# Patient Record
Sex: Female | Born: 1950 | Race: White | Hispanic: No | Marital: Single | Smoking: Former smoker
Health system: Southern US, Community
[De-identification: ages and names within clinical notes are randomized; demographics above are authoritative.]

## PROBLEM LIST (undated history)

## (undated) DIAGNOSIS — I700 Atherosclerosis of aorta: Secondary | ICD-10-CM

## (undated) DIAGNOSIS — T8859XA Other complications of anesthesia, initial encounter: Secondary | ICD-10-CM

## (undated) DIAGNOSIS — I110 Hypertensive heart disease with heart failure: Secondary | ICD-10-CM

## (undated) DIAGNOSIS — F329 Major depressive disorder, single episode, unspecified: Secondary | ICD-10-CM

## (undated) DIAGNOSIS — I5189 Other ill-defined heart diseases: Secondary | ICD-10-CM

## (undated) DIAGNOSIS — M797 Fibromyalgia: Secondary | ICD-10-CM

## (undated) DIAGNOSIS — T4145XA Adverse effect of unspecified anesthetic, initial encounter: Secondary | ICD-10-CM

## (undated) DIAGNOSIS — I5032 Chronic diastolic (congestive) heart failure: Secondary | ICD-10-CM

## (undated) DIAGNOSIS — R7303 Prediabetes: Secondary | ICD-10-CM

## (undated) DIAGNOSIS — F32A Depression, unspecified: Secondary | ICD-10-CM

## (undated) DIAGNOSIS — J189 Pneumonia, unspecified organism: Secondary | ICD-10-CM

## (undated) DIAGNOSIS — J45909 Unspecified asthma, uncomplicated: Secondary | ICD-10-CM

## (undated) DIAGNOSIS — K219 Gastro-esophageal reflux disease without esophagitis: Secondary | ICD-10-CM

## (undated) DIAGNOSIS — E7800 Pure hypercholesterolemia, unspecified: Secondary | ICD-10-CM

## (undated) DIAGNOSIS — N183 Chronic kidney disease, stage 3 unspecified: Secondary | ICD-10-CM

## (undated) DIAGNOSIS — I10 Essential (primary) hypertension: Secondary | ICD-10-CM

## (undated) DIAGNOSIS — F341 Dysthymic disorder: Secondary | ICD-10-CM

## (undated) DIAGNOSIS — R61 Generalized hyperhidrosis: Secondary | ICD-10-CM

## (undated) DIAGNOSIS — K589 Irritable bowel syndrome without diarrhea: Secondary | ICD-10-CM

## (undated) HISTORY — DX: Generalized hyperhidrosis: R61

## (undated) HISTORY — DX: Essential (primary) hypertension: I10

## (undated) HISTORY — DX: Chronic kidney disease, stage 3 unspecified: N18.30

## (undated) HISTORY — DX: Other ill-defined heart diseases: I51.89

## (undated) HISTORY — DX: Hypertensive heart disease with heart failure: I11.0

## (undated) HISTORY — DX: Irritable bowel syndrome, unspecified: K58.9

## (undated) HISTORY — DX: Unspecified asthma, uncomplicated: J45.909

## (undated) HISTORY — DX: Pure hypercholesterolemia, unspecified: E78.00

## (undated) HISTORY — DX: Chronic diastolic (congestive) heart failure: I50.32

## (undated) HISTORY — PX: ARTHROSCOPY KNEE W/ DRILLING: SUR92

## (undated) HISTORY — DX: Atherosclerosis of aorta: I70.0

## (undated) HISTORY — DX: Fibromyalgia: M79.7

## (undated) HISTORY — DX: Gastro-esophageal reflux disease without esophagitis: K21.9

## (undated) HISTORY — DX: Dysthymic disorder: F34.1

---
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
## 2010-05-13 ENCOUNTER — Emergency Department (HOSPITAL_COMMUNITY): Admission: EM | Admit: 2010-05-13 | Discharge: 2010-05-13 | Payer: Self-pay | Admitting: Emergency Medicine

## 2010-08-23 ENCOUNTER — Encounter: Admission: RE | Admit: 2010-08-23 | Discharge: 2010-08-23 | Payer: Self-pay | Admitting: Internal Medicine

## 2013-03-02 ENCOUNTER — Other Ambulatory Visit: Payer: Self-pay | Admitting: Family Medicine

## 2013-03-02 DIAGNOSIS — R531 Weakness: Secondary | ICD-10-CM

## 2013-03-02 DIAGNOSIS — R42 Dizziness and giddiness: Secondary | ICD-10-CM

## 2013-03-05 ENCOUNTER — Other Ambulatory Visit: Payer: Self-pay

## 2013-03-07 ENCOUNTER — Ambulatory Visit
Admission: RE | Admit: 2013-03-07 | Discharge: 2013-03-07 | Disposition: A | Discharge status: Home / Self Care | Payer: BC Managed Care – PPO | Source: Ambulatory Visit | Attending: Family Medicine | Admitting: Family Medicine

## 2013-03-07 DIAGNOSIS — R42 Dizziness and giddiness: Secondary | ICD-10-CM

## 2013-03-07 DIAGNOSIS — R531 Weakness: Secondary | ICD-10-CM

## 2013-06-09 DIAGNOSIS — M5136 Other intervertebral disc degeneration, lumbar region: Secondary | ICD-10-CM | POA: Insufficient documentation

## 2013-06-09 DIAGNOSIS — M47816 Spondylosis without myelopathy or radiculopathy, lumbar region: Secondary | ICD-10-CM | POA: Insufficient documentation

## 2013-06-09 DIAGNOSIS — M51369 Other intervertebral disc degeneration, lumbar region without mention of lumbar back pain or lower extremity pain: Secondary | ICD-10-CM

## 2013-06-09 HISTORY — DX: Spondylosis without myelopathy or radiculopathy, lumbar region: M47.816

## 2013-06-09 HISTORY — DX: Other intervertebral disc degeneration, lumbar region: M51.36

## 2013-06-09 HISTORY — DX: Other intervertebral disc degeneration, lumbar region without mention of lumbar back pain or lower extremity pain: M51.369

## 2013-06-10 ENCOUNTER — Other Ambulatory Visit: Payer: Self-pay | Admitting: Anesthesiology

## 2013-06-10 DIAGNOSIS — M542 Cervicalgia: Secondary | ICD-10-CM

## 2013-06-10 DIAGNOSIS — M545 Low back pain, unspecified: Secondary | ICD-10-CM

## 2013-06-10 DIAGNOSIS — M546 Pain in thoracic spine: Secondary | ICD-10-CM

## 2013-06-16 ENCOUNTER — Ambulatory Visit
Admission: RE | Admit: 2013-06-16 | Discharge: 2013-06-16 | Disposition: A | Discharge status: Home / Self Care | Payer: BC Managed Care – PPO | Source: Ambulatory Visit | Attending: Anesthesiology | Admitting: Anesthesiology

## 2013-06-16 DIAGNOSIS — M545 Low back pain, unspecified: Secondary | ICD-10-CM

## 2013-06-16 DIAGNOSIS — M542 Cervicalgia: Secondary | ICD-10-CM

## 2013-06-16 DIAGNOSIS — M546 Pain in thoracic spine: Secondary | ICD-10-CM

## 2013-11-10 HISTORY — PX: HERNIA REPAIR: SHX51

## 2015-08-22 DIAGNOSIS — M153 Secondary multiple arthritis: Secondary | ICD-10-CM | POA: Diagnosis not present

## 2015-08-22 DIAGNOSIS — M797 Fibromyalgia: Secondary | ICD-10-CM | POA: Diagnosis not present

## 2015-08-22 DIAGNOSIS — E782 Mixed hyperlipidemia: Secondary | ICD-10-CM | POA: Diagnosis not present

## 2015-08-22 DIAGNOSIS — R7301 Impaired fasting glucose: Secondary | ICD-10-CM | POA: Diagnosis not present

## 2015-08-22 DIAGNOSIS — M25561 Pain in right knee: Secondary | ICD-10-CM | POA: Diagnosis not present

## 2015-08-22 DIAGNOSIS — R61 Generalized hyperhidrosis: Secondary | ICD-10-CM | POA: Diagnosis not present

## 2015-08-22 DIAGNOSIS — M25562 Pain in left knee: Secondary | ICD-10-CM | POA: Diagnosis not present

## 2015-08-22 DIAGNOSIS — Z23 Encounter for immunization: Secondary | ICD-10-CM | POA: Diagnosis not present

## 2015-08-22 DIAGNOSIS — I10 Essential (primary) hypertension: Secondary | ICD-10-CM | POA: Diagnosis not present

## 2015-08-22 DIAGNOSIS — E7800 Pure hypercholesterolemia, unspecified: Secondary | ICD-10-CM | POA: Diagnosis not present

## 2015-08-22 DIAGNOSIS — Z6841 Body Mass Index (BMI) 40.0 and over, adult: Secondary | ICD-10-CM | POA: Diagnosis not present

## 2015-11-28 DIAGNOSIS — E782 Mixed hyperlipidemia: Secondary | ICD-10-CM | POA: Diagnosis not present

## 2015-11-28 DIAGNOSIS — Z6841 Body Mass Index (BMI) 40.0 and over, adult: Secondary | ICD-10-CM | POA: Diagnosis not present

## 2015-11-28 DIAGNOSIS — M153 Secondary multiple arthritis: Secondary | ICD-10-CM | POA: Diagnosis not present

## 2015-11-28 DIAGNOSIS — M797 Fibromyalgia: Secondary | ICD-10-CM | POA: Diagnosis not present

## 2015-11-28 DIAGNOSIS — R7301 Impaired fasting glucose: Secondary | ICD-10-CM | POA: Diagnosis not present

## 2015-11-28 DIAGNOSIS — M25562 Pain in left knee: Secondary | ICD-10-CM | POA: Diagnosis not present

## 2015-11-28 DIAGNOSIS — M25561 Pain in right knee: Secondary | ICD-10-CM | POA: Diagnosis not present

## 2015-11-28 DIAGNOSIS — I10 Essential (primary) hypertension: Secondary | ICD-10-CM | POA: Diagnosis not present

## 2016-02-29 DIAGNOSIS — M25561 Pain in right knee: Secondary | ICD-10-CM | POA: Diagnosis not present

## 2016-02-29 DIAGNOSIS — M25551 Pain in right hip: Secondary | ICD-10-CM | POA: Diagnosis not present

## 2016-02-29 DIAGNOSIS — M25562 Pain in left knee: Secondary | ICD-10-CM | POA: Diagnosis not present

## 2016-02-29 DIAGNOSIS — M797 Fibromyalgia: Secondary | ICD-10-CM | POA: Diagnosis not present

## 2016-02-29 DIAGNOSIS — Z6841 Body Mass Index (BMI) 40.0 and over, adult: Secondary | ICD-10-CM | POA: Diagnosis not present

## 2016-02-29 DIAGNOSIS — E782 Mixed hyperlipidemia: Secondary | ICD-10-CM | POA: Diagnosis not present

## 2016-02-29 DIAGNOSIS — R7301 Impaired fasting glucose: Secondary | ICD-10-CM | POA: Diagnosis not present

## 2016-02-29 DIAGNOSIS — M153 Secondary multiple arthritis: Secondary | ICD-10-CM | POA: Diagnosis not present

## 2016-02-29 DIAGNOSIS — R21 Rash and other nonspecific skin eruption: Secondary | ICD-10-CM | POA: Diagnosis not present

## 2016-02-29 DIAGNOSIS — I10 Essential (primary) hypertension: Secondary | ICD-10-CM | POA: Diagnosis not present

## 2016-03-31 DIAGNOSIS — R7301 Impaired fasting glucose: Secondary | ICD-10-CM | POA: Diagnosis not present

## 2016-04-09 DIAGNOSIS — R7301 Impaired fasting glucose: Secondary | ICD-10-CM | POA: Diagnosis not present

## 2016-04-15 DIAGNOSIS — R7301 Impaired fasting glucose: Secondary | ICD-10-CM | POA: Diagnosis not present

## 2016-05-22 DIAGNOSIS — H43813 Vitreous degeneration, bilateral: Secondary | ICD-10-CM | POA: Diagnosis not present

## 2016-05-22 DIAGNOSIS — H5201 Hypermetropia, right eye: Secondary | ICD-10-CM | POA: Diagnosis not present

## 2016-05-22 DIAGNOSIS — H52223 Regular astigmatism, bilateral: Secondary | ICD-10-CM | POA: Diagnosis not present

## 2016-05-22 DIAGNOSIS — H43393 Other vitreous opacities, bilateral: Secondary | ICD-10-CM | POA: Diagnosis not present

## 2016-05-22 DIAGNOSIS — H25813 Combined forms of age-related cataract, bilateral: Secondary | ICD-10-CM | POA: Diagnosis not present

## 2016-05-22 DIAGNOSIS — E119 Type 2 diabetes mellitus without complications: Secondary | ICD-10-CM | POA: Diagnosis not present

## 2016-05-22 DIAGNOSIS — Z7984 Long term (current) use of oral hypoglycemic drugs: Secondary | ICD-10-CM | POA: Diagnosis not present

## 2016-05-22 DIAGNOSIS — I10 Essential (primary) hypertension: Secondary | ICD-10-CM | POA: Diagnosis not present

## 2016-05-22 DIAGNOSIS — H524 Presbyopia: Secondary | ICD-10-CM | POA: Diagnosis not present

## 2016-06-11 DIAGNOSIS — R61 Generalized hyperhidrosis: Secondary | ICD-10-CM | POA: Diagnosis not present

## 2016-06-11 DIAGNOSIS — E785 Hyperlipidemia, unspecified: Secondary | ICD-10-CM | POA: Diagnosis not present

## 2016-06-11 DIAGNOSIS — M797 Fibromyalgia: Secondary | ICD-10-CM | POA: Diagnosis not present

## 2016-06-11 DIAGNOSIS — Z79899 Other long term (current) drug therapy: Secondary | ICD-10-CM | POA: Diagnosis not present

## 2016-06-11 DIAGNOSIS — R69 Illness, unspecified: Secondary | ICD-10-CM | POA: Diagnosis not present

## 2016-06-11 DIAGNOSIS — R7301 Impaired fasting glucose: Secondary | ICD-10-CM | POA: Diagnosis not present

## 2016-06-11 DIAGNOSIS — J302 Other seasonal allergic rhinitis: Secondary | ICD-10-CM | POA: Diagnosis not present

## 2016-06-11 DIAGNOSIS — I10 Essential (primary) hypertension: Secondary | ICD-10-CM | POA: Diagnosis not present

## 2016-06-11 DIAGNOSIS — K219 Gastro-esophageal reflux disease without esophagitis: Secondary | ICD-10-CM | POA: Diagnosis not present

## 2016-06-11 DIAGNOSIS — G2581 Restless legs syndrome: Secondary | ICD-10-CM | POA: Diagnosis not present

## 2016-07-16 DIAGNOSIS — J0100 Acute maxillary sinusitis, unspecified: Secondary | ICD-10-CM | POA: Diagnosis not present

## 2016-07-16 DIAGNOSIS — J209 Acute bronchitis, unspecified: Secondary | ICD-10-CM | POA: Diagnosis not present

## 2016-09-24 DIAGNOSIS — J3489 Other specified disorders of nose and nasal sinuses: Secondary | ICD-10-CM | POA: Diagnosis not present

## 2016-09-24 DIAGNOSIS — Z79899 Other long term (current) drug therapy: Secondary | ICD-10-CM | POA: Diagnosis not present

## 2016-09-24 DIAGNOSIS — R42 Dizziness and giddiness: Secondary | ICD-10-CM | POA: Diagnosis not present

## 2016-10-14 DIAGNOSIS — R69 Illness, unspecified: Secondary | ICD-10-CM | POA: Diagnosis not present

## 2016-10-14 DIAGNOSIS — M797 Fibromyalgia: Secondary | ICD-10-CM | POA: Diagnosis not present

## 2016-10-14 DIAGNOSIS — Z23 Encounter for immunization: Secondary | ICD-10-CM | POA: Diagnosis not present

## 2016-10-14 DIAGNOSIS — I10 Essential (primary) hypertension: Secondary | ICD-10-CM | POA: Diagnosis not present

## 2016-10-14 DIAGNOSIS — G2581 Restless legs syndrome: Secondary | ICD-10-CM | POA: Diagnosis not present

## 2016-10-14 DIAGNOSIS — E785 Hyperlipidemia, unspecified: Secondary | ICD-10-CM | POA: Diagnosis not present

## 2016-10-14 DIAGNOSIS — Z79899 Other long term (current) drug therapy: Secondary | ICD-10-CM | POA: Diagnosis not present

## 2016-10-14 DIAGNOSIS — K219 Gastro-esophageal reflux disease without esophagitis: Secondary | ICD-10-CM | POA: Diagnosis not present

## 2016-10-14 DIAGNOSIS — R7301 Impaired fasting glucose: Secondary | ICD-10-CM | POA: Diagnosis not present

## 2016-10-14 DIAGNOSIS — M159 Polyosteoarthritis, unspecified: Secondary | ICD-10-CM | POA: Diagnosis not present

## 2016-11-10 HISTORY — PX: CYST EXCISION: SHX5701

## 2016-12-04 DIAGNOSIS — M797 Fibromyalgia: Secondary | ICD-10-CM | POA: Diagnosis not present

## 2016-12-04 DIAGNOSIS — R42 Dizziness and giddiness: Secondary | ICD-10-CM | POA: Diagnosis not present

## 2016-12-04 DIAGNOSIS — R7301 Impaired fasting glucose: Secondary | ICD-10-CM | POA: Diagnosis not present

## 2016-12-04 DIAGNOSIS — E876 Hypokalemia: Secondary | ICD-10-CM | POA: Diagnosis not present

## 2016-12-04 DIAGNOSIS — M159 Polyosteoarthritis, unspecified: Secondary | ICD-10-CM | POA: Diagnosis not present

## 2016-12-04 DIAGNOSIS — K219 Gastro-esophageal reflux disease without esophagitis: Secondary | ICD-10-CM | POA: Diagnosis not present

## 2017-02-09 DIAGNOSIS — R635 Abnormal weight gain: Secondary | ICD-10-CM | POA: Diagnosis not present

## 2017-02-09 DIAGNOSIS — N951 Menopausal and female climacteric states: Secondary | ICD-10-CM | POA: Diagnosis not present

## 2017-02-11 DIAGNOSIS — E782 Mixed hyperlipidemia: Secondary | ICD-10-CM | POA: Diagnosis not present

## 2017-02-11 DIAGNOSIS — N951 Menopausal and female climacteric states: Secondary | ICD-10-CM | POA: Diagnosis not present

## 2017-02-11 DIAGNOSIS — I10 Essential (primary) hypertension: Secondary | ICD-10-CM | POA: Diagnosis not present

## 2017-02-11 DIAGNOSIS — Z6841 Body Mass Index (BMI) 40.0 and over, adult: Secondary | ICD-10-CM | POA: Diagnosis not present

## 2017-02-11 DIAGNOSIS — E538 Deficiency of other specified B group vitamins: Secondary | ICD-10-CM | POA: Diagnosis not present

## 2017-02-12 DIAGNOSIS — K219 Gastro-esophageal reflux disease without esophagitis: Secondary | ICD-10-CM | POA: Diagnosis not present

## 2017-02-12 DIAGNOSIS — J329 Chronic sinusitis, unspecified: Secondary | ICD-10-CM | POA: Diagnosis not present

## 2017-02-12 DIAGNOSIS — Z79899 Other long term (current) drug therapy: Secondary | ICD-10-CM | POA: Diagnosis not present

## 2017-02-12 DIAGNOSIS — M797 Fibromyalgia: Secondary | ICD-10-CM | POA: Diagnosis not present

## 2017-02-12 DIAGNOSIS — E785 Hyperlipidemia, unspecified: Secondary | ICD-10-CM | POA: Diagnosis not present

## 2017-02-12 DIAGNOSIS — M159 Polyosteoarthritis, unspecified: Secondary | ICD-10-CM | POA: Diagnosis not present

## 2017-02-12 DIAGNOSIS — R7301 Impaired fasting glucose: Secondary | ICD-10-CM | POA: Diagnosis not present

## 2017-02-12 DIAGNOSIS — I10 Essential (primary) hypertension: Secondary | ICD-10-CM | POA: Diagnosis not present

## 2017-02-12 DIAGNOSIS — R69 Illness, unspecified: Secondary | ICD-10-CM | POA: Diagnosis not present

## 2017-02-17 DIAGNOSIS — Z6841 Body Mass Index (BMI) 40.0 and over, adult: Secondary | ICD-10-CM | POA: Diagnosis not present

## 2017-02-17 DIAGNOSIS — E538 Deficiency of other specified B group vitamins: Secondary | ICD-10-CM | POA: Diagnosis not present

## 2017-02-17 DIAGNOSIS — I10 Essential (primary) hypertension: Secondary | ICD-10-CM | POA: Diagnosis not present

## 2017-02-17 DIAGNOSIS — E782 Mixed hyperlipidemia: Secondary | ICD-10-CM | POA: Diagnosis not present

## 2017-02-25 DIAGNOSIS — E538 Deficiency of other specified B group vitamins: Secondary | ICD-10-CM | POA: Diagnosis not present

## 2017-02-25 DIAGNOSIS — Z6841 Body Mass Index (BMI) 40.0 and over, adult: Secondary | ICD-10-CM | POA: Diagnosis not present

## 2017-02-25 DIAGNOSIS — I10 Essential (primary) hypertension: Secondary | ICD-10-CM | POA: Diagnosis not present

## 2017-02-25 DIAGNOSIS — E782 Mixed hyperlipidemia: Secondary | ICD-10-CM | POA: Diagnosis not present

## 2017-03-05 DIAGNOSIS — Z6841 Body Mass Index (BMI) 40.0 and over, adult: Secondary | ICD-10-CM | POA: Diagnosis not present

## 2017-03-05 DIAGNOSIS — I10 Essential (primary) hypertension: Secondary | ICD-10-CM | POA: Diagnosis not present

## 2017-03-05 DIAGNOSIS — E538 Deficiency of other specified B group vitamins: Secondary | ICD-10-CM | POA: Diagnosis not present

## 2017-03-05 DIAGNOSIS — E782 Mixed hyperlipidemia: Secondary | ICD-10-CM | POA: Diagnosis not present

## 2017-03-11 DIAGNOSIS — I10 Essential (primary) hypertension: Secondary | ICD-10-CM | POA: Diagnosis not present

## 2017-03-11 DIAGNOSIS — E782 Mixed hyperlipidemia: Secondary | ICD-10-CM | POA: Diagnosis not present

## 2017-03-11 DIAGNOSIS — E538 Deficiency of other specified B group vitamins: Secondary | ICD-10-CM | POA: Diagnosis not present

## 2017-03-11 DIAGNOSIS — Z6841 Body Mass Index (BMI) 40.0 and over, adult: Secondary | ICD-10-CM | POA: Diagnosis not present

## 2017-03-30 DIAGNOSIS — E782 Mixed hyperlipidemia: Secondary | ICD-10-CM | POA: Diagnosis not present

## 2017-03-30 DIAGNOSIS — I10 Essential (primary) hypertension: Secondary | ICD-10-CM | POA: Diagnosis not present

## 2017-03-30 DIAGNOSIS — Z6841 Body Mass Index (BMI) 40.0 and over, adult: Secondary | ICD-10-CM | POA: Diagnosis not present

## 2017-05-17 DIAGNOSIS — L02222 Furuncle of back [any part, except buttock]: Secondary | ICD-10-CM | POA: Diagnosis not present

## 2017-05-17 DIAGNOSIS — L0390 Cellulitis, unspecified: Secondary | ICD-10-CM | POA: Diagnosis not present

## 2017-05-20 DIAGNOSIS — L0291 Cutaneous abscess, unspecified: Secondary | ICD-10-CM | POA: Diagnosis not present

## 2017-05-29 DIAGNOSIS — L0291 Cutaneous abscess, unspecified: Secondary | ICD-10-CM | POA: Diagnosis not present

## 2017-05-29 DIAGNOSIS — Z6841 Body Mass Index (BMI) 40.0 and over, adult: Secondary | ICD-10-CM | POA: Diagnosis not present

## 2017-06-02 DIAGNOSIS — L02212 Cutaneous abscess of back [any part, except buttock]: Secondary | ICD-10-CM

## 2017-06-02 DIAGNOSIS — M797 Fibromyalgia: Secondary | ICD-10-CM | POA: Insufficient documentation

## 2017-06-02 DIAGNOSIS — M1990 Unspecified osteoarthritis, unspecified site: Secondary | ICD-10-CM | POA: Insufficient documentation

## 2017-06-02 HISTORY — DX: Unspecified osteoarthritis, unspecified site: M19.90

## 2017-06-02 HISTORY — DX: Cutaneous abscess of back (any part, except buttock): L02.212

## 2017-06-05 DIAGNOSIS — Z87891 Personal history of nicotine dependence: Secondary | ICD-10-CM | POA: Diagnosis not present

## 2017-06-05 DIAGNOSIS — I10 Essential (primary) hypertension: Secondary | ICD-10-CM | POA: Diagnosis not present

## 2017-06-05 DIAGNOSIS — L720 Epidermal cyst: Secondary | ICD-10-CM | POA: Diagnosis not present

## 2017-06-05 DIAGNOSIS — L02212 Cutaneous abscess of back [any part, except buttock]: Secondary | ICD-10-CM | POA: Diagnosis not present

## 2017-06-05 DIAGNOSIS — K219 Gastro-esophageal reflux disease without esophagitis: Secondary | ICD-10-CM | POA: Diagnosis not present

## 2017-06-05 DIAGNOSIS — M797 Fibromyalgia: Secondary | ICD-10-CM | POA: Diagnosis not present

## 2017-06-05 DIAGNOSIS — E785 Hyperlipidemia, unspecified: Secondary | ICD-10-CM | POA: Diagnosis not present

## 2017-06-05 DIAGNOSIS — Z79899 Other long term (current) drug therapy: Secondary | ICD-10-CM | POA: Diagnosis not present

## 2017-06-05 DIAGNOSIS — J449 Chronic obstructive pulmonary disease, unspecified: Secondary | ICD-10-CM | POA: Diagnosis not present

## 2017-06-09 DIAGNOSIS — R69 Illness, unspecified: Secondary | ICD-10-CM | POA: Diagnosis not present

## 2017-06-09 DIAGNOSIS — I10 Essential (primary) hypertension: Secondary | ICD-10-CM | POA: Diagnosis not present

## 2017-06-09 DIAGNOSIS — M797 Fibromyalgia: Secondary | ICD-10-CM | POA: Diagnosis not present

## 2017-06-09 DIAGNOSIS — M150 Primary generalized (osteo)arthritis: Secondary | ICD-10-CM | POA: Diagnosis not present

## 2017-06-09 DIAGNOSIS — L02212 Cutaneous abscess of back [any part, except buttock]: Secondary | ICD-10-CM | POA: Diagnosis not present

## 2017-07-14 DIAGNOSIS — Z6841 Body Mass Index (BMI) 40.0 and over, adult: Secondary | ICD-10-CM | POA: Diagnosis not present

## 2017-07-14 DIAGNOSIS — R7301 Impaired fasting glucose: Secondary | ICD-10-CM | POA: Diagnosis not present

## 2017-07-14 DIAGNOSIS — I10 Essential (primary) hypertension: Secondary | ICD-10-CM | POA: Diagnosis not present

## 2017-07-14 DIAGNOSIS — Z872 Personal history of diseases of the skin and subcutaneous tissue: Secondary | ICD-10-CM | POA: Diagnosis not present

## 2017-07-14 DIAGNOSIS — E785 Hyperlipidemia, unspecified: Secondary | ICD-10-CM | POA: Diagnosis not present

## 2017-07-14 DIAGNOSIS — Z1389 Encounter for screening for other disorder: Secondary | ICD-10-CM | POA: Diagnosis not present

## 2017-08-13 DIAGNOSIS — Z872 Personal history of diseases of the skin and subcutaneous tissue: Secondary | ICD-10-CM

## 2017-08-13 HISTORY — DX: Personal history of diseases of the skin and subcutaneous tissue: Z87.2

## 2017-09-28 DIAGNOSIS — M797 Fibromyalgia: Secondary | ICD-10-CM | POA: Diagnosis not present

## 2017-09-28 DIAGNOSIS — E785 Hyperlipidemia, unspecified: Secondary | ICD-10-CM | POA: Diagnosis not present

## 2017-09-28 DIAGNOSIS — Z1159 Encounter for screening for other viral diseases: Secondary | ICD-10-CM | POA: Diagnosis not present

## 2017-09-28 DIAGNOSIS — K219 Gastro-esophageal reflux disease without esophagitis: Secondary | ICD-10-CM | POA: Diagnosis not present

## 2017-09-28 DIAGNOSIS — Z79899 Other long term (current) drug therapy: Secondary | ICD-10-CM | POA: Diagnosis not present

## 2017-09-28 DIAGNOSIS — Z0000 Encounter for general adult medical examination without abnormal findings: Secondary | ICD-10-CM | POA: Diagnosis not present

## 2017-09-28 DIAGNOSIS — M159 Polyosteoarthritis, unspecified: Secondary | ICD-10-CM | POA: Diagnosis not present

## 2017-09-28 DIAGNOSIS — Z23 Encounter for immunization: Secondary | ICD-10-CM | POA: Diagnosis not present

## 2017-09-28 DIAGNOSIS — G2581 Restless legs syndrome: Secondary | ICD-10-CM | POA: Diagnosis not present

## 2017-09-28 DIAGNOSIS — R7301 Impaired fasting glucose: Secondary | ICD-10-CM | POA: Diagnosis not present

## 2017-09-28 DIAGNOSIS — I10 Essential (primary) hypertension: Secondary | ICD-10-CM | POA: Diagnosis not present

## 2017-09-30 DIAGNOSIS — Z1231 Encounter for screening mammogram for malignant neoplasm of breast: Secondary | ICD-10-CM | POA: Diagnosis not present

## 2017-10-07 DIAGNOSIS — K219 Gastro-esophageal reflux disease without esophagitis: Secondary | ICD-10-CM | POA: Diagnosis not present

## 2017-10-07 DIAGNOSIS — Z0000 Encounter for general adult medical examination without abnormal findings: Secondary | ICD-10-CM | POA: Diagnosis not present

## 2017-10-07 DIAGNOSIS — R69 Illness, unspecified: Secondary | ICD-10-CM | POA: Diagnosis not present

## 2017-10-07 DIAGNOSIS — K08409 Partial loss of teeth, unspecified cause, unspecified class: Secondary | ICD-10-CM | POA: Diagnosis not present

## 2017-10-07 DIAGNOSIS — R29898 Other symptoms and signs involving the musculoskeletal system: Secondary | ICD-10-CM | POA: Diagnosis not present

## 2017-10-07 DIAGNOSIS — I10 Essential (primary) hypertension: Secondary | ICD-10-CM | POA: Diagnosis not present

## 2017-10-07 DIAGNOSIS — M797 Fibromyalgia: Secondary | ICD-10-CM | POA: Diagnosis not present

## 2017-10-07 DIAGNOSIS — K029 Dental caries, unspecified: Secondary | ICD-10-CM | POA: Diagnosis not present

## 2017-11-09 DIAGNOSIS — B370 Candidal stomatitis: Secondary | ICD-10-CM | POA: Diagnosis not present

## 2017-11-09 DIAGNOSIS — R21 Rash and other nonspecific skin eruption: Secondary | ICD-10-CM | POA: Diagnosis not present

## 2017-11-09 DIAGNOSIS — Z6841 Body Mass Index (BMI) 40.0 and over, adult: Secondary | ICD-10-CM | POA: Diagnosis not present

## 2017-11-23 DIAGNOSIS — J329 Chronic sinusitis, unspecified: Secondary | ICD-10-CM | POA: Diagnosis not present

## 2017-11-23 DIAGNOSIS — Z6841 Body Mass Index (BMI) 40.0 and over, adult: Secondary | ICD-10-CM | POA: Diagnosis not present

## 2017-11-23 DIAGNOSIS — J40 Bronchitis, not specified as acute or chronic: Secondary | ICD-10-CM | POA: Diagnosis not present

## 2017-12-29 DIAGNOSIS — L710 Perioral dermatitis: Secondary | ICD-10-CM | POA: Diagnosis not present

## 2018-01-07 ENCOUNTER — Ambulatory Visit: Payer: Medicare HMO | Admitting: Allergy and Immunology

## 2018-01-07 ENCOUNTER — Encounter: Payer: Self-pay | Admitting: Allergy and Immunology

## 2018-01-07 VITALS — BP 128/78 | HR 64 | Temp 98.5°F | Resp 16 | Ht 64.0 in | Wt 275.8 lb

## 2018-01-07 DIAGNOSIS — H1010 Acute atopic conjunctivitis, unspecified eye: Secondary | ICD-10-CM

## 2018-01-07 DIAGNOSIS — R69 Illness, unspecified: Secondary | ICD-10-CM | POA: Diagnosis not present

## 2018-01-07 DIAGNOSIS — J3089 Other allergic rhinitis: Secondary | ICD-10-CM | POA: Diagnosis not present

## 2018-01-07 DIAGNOSIS — L710 Perioral dermatitis: Secondary | ICD-10-CM

## 2018-01-07 DIAGNOSIS — B999 Unspecified infectious disease: Secondary | ICD-10-CM | POA: Diagnosis not present

## 2018-01-07 DIAGNOSIS — J4520 Mild intermittent asthma, uncomplicated: Secondary | ICD-10-CM | POA: Diagnosis not present

## 2018-01-07 MED ORDER — IPRATROPIUM BROMIDE 0.02 % IN SOLN: RESPIRATORY_TRACT | 1 refills | Status: DC

## 2018-01-07 NOTE — Progress Notes (Signed)
NEW PATIENT NOTE  Referring Provider: No ref. provider found Primary Provider: Street, Sharon Mt, MD Date of office visit: 01/07/2018    Subjective:   Chief Complaint:  Sherry Riley (DOB: 26-Jan-1951) is a 67 y.o. female who presents to the clinic on 01/07/2018 with a chief complaint of Rash .  HPI: Sherry Riley presents to this clinic in evaluation of 4 issues:  First, Sherry Riley has nasal congestion and sneezing and itchy red watery eyes that appear to occur on a perennial basis and flare from spring through fall season without any obvious trigger.  Sherry Riley takes Claritin 10 mg a day throughout the entire year which does help until the spring arrives.  It does not sound as though Sherry Riley has required any antibiotics for recurrent infections of her upper airways.  Second, Sherry Riley has "bronchitis".  Whenever Sherry Riley develops a head cold Sherry Riley would develop shortness of breath and chest tightness and coughing and wheezing and Sherry Riley will start Symbicort and a short acting bronchodilator.  Fortunately, her last flareup was January 2018.  Sherry Riley does not have a regular history of wheezing or coughing.  Sherry Riley does not really exercise to any significant degree because of a musculoskeletal issue.  It does not sound as though Sherry Riley has required any systemic steroids to treat a lower airway issue in over a year.  Third, Sherry Riley has developed problems with infections on her skin.  Back in July 2018 Sherry Riley required a I&D of a very large abscess affecting her left lower back.  Starting in November 2018 Sherry Riley developed these very red inflamed areas around her mouth and down her neck.  Sherry Riley recently visited with a dermatologist who started her on antibiotics and topical metronidazole and this is helping this situation significantly.  Fourth, Sherry Riley has a history of large local reaction to insect stings without any associated systemic or constitutional symptoms that dates back to childhood.  Past Medical History:  Diagnosis Date  . Asthma   .  Fibromyalgia   . GERD (gastroesophageal reflux disease)   . High blood pressure   . High cholesterol     Past Surgical History:  Procedure Laterality Date  . CYST EXCISION  2018   Back cyst  . HERNIA REPAIR  2015    Allergies as of 01/07/2018      Reactions   Atorvastatin Other (See Comments)   Myalgia   Lyrica [pregabalin] Rash   Actonel [risedronate Sodium]    Coreg [carvedilol]    Hydralazine Hcl Other (See Comments), Cough   Chest pain, dyspnea   Ace Inhibitors Rash   Diltiazem Hcl Itching, Rash   Diovan Hct [valsartan-hydrochlorothiazide] Rash   Valsartan Rash      Medication List      albuterol (2.5 MG/3ML) 0.083% nebulizer solution Commonly known as:  PROVENTIL Take 2.5 mg by nebulization every 4 (four) hours as needed for wheezing or shortness of breath.   PROAIR HFA 108 (90 Base) MCG/ACT inhaler Generic drug:  albuterol Inhale two puffs every four to six hours as needed for breathing problems.   amitriptyline 25 MG tablet Commonly known as:  ELAVIL TAKE 1 (ONE) TABLET AT BEDTIME   CALCIUM MAGNESIUM PO Take by mouth.   chlorthalidone 25 MG tablet Commonly known as:  HYGROTON Take 25 mg by mouth daily.   doxycycline 100 MG capsule Commonly known as:  VIBRAMYCIN TAKE 1 CAPSULE BY MOUTH EVERY DAY WITH FOOD   FISH OIL PO Take by mouth daily.  gabapentin 600 MG tablet Commonly known as:  NEURONTIN Take 600 mg by mouth 4 (four) times daily.   loratadine 10 MG tablet Commonly known as:  CLARITIN Take 10 mg by mouth daily.   OSTEO BI-FLEX JOINT SHIELD PO Take by mouth.   pravastatin 40 MG tablet Commonly known as:  PRAVACHOL   ranitidine 150 MG tablet Commonly known as:  ZANTAC Take 150 mg by mouth 2 (two) times daily.   SYMBICORT 160-4.5 MCG/ACT inhaler Generic drug:  budesonide-formoterol Inhale 1 puff into the lungs 2 (two) times daily.   venlafaxine 37.5 MG tablet Commonly known as:  EFFEXOR Take 37.5 mg by mouth daily.        Review of systems negative except as noted in HPI / PMHx or noted below:  ROS  Family History  Problem Relation Age of Onset  . Asthma Mother   . Heart attack Mother   . Lung cancer Father   . Diabetes Brother     Social History   Socioeconomic History  . Marital status: Single    Spouse name: Not on file  . Number of children: Not on file  . Years of education: Not on file  . Highest education level: Not on file  Social Needs  . Financial resource strain: Not on file  . Food insecurity - worry: Not on file  . Food insecurity - inability: Not on file  . Transportation needs - medical: Not on file  . Transportation needs - non-medical: Not on file  Occupational History  . Not on file  Tobacco Use  . Smoking status: Former Smoker    Years: 36.00    Types: Cigarettes    Last attempt to quit: 2011    Years since quitting: 8.1  . Smokeless tobacco: Never Used  . Tobacco comment: one-half to one pack per day  Substance and Sexual Activity  . Alcohol use: No    Frequency: Never  . Drug use: No  . Sexual activity: Not on file  Other Topics Concern  . Not on file  Social History Narrative  . Not on file    Environmental and Social history  Lives in a house with a dry environment, no animals located inside the household, carpet in the bedroom, plastic on the bed, plastic on the pillow, and no smokers located inside the household.  Objective:   Vitals:   01/07/18 0850  BP: 128/78  Pulse: 64  Resp: 16  Temp: 98.5 F (36.9 C)   Height: 5\' 4"  (162.6 cm) Weight: 275 lb 12.8 oz (125.1 kg)  Physical Exam  Constitutional: Sherry Riley is well-developed, well-nourished, and in no distress.  HENT:  Head: Normocephalic. Head is without right periorbital erythema and without left periorbital erythema.  Right Ear: Tympanic membrane, external ear and ear canal normal.  Left Ear: Tympanic membrane, external ear and ear canal normal.  Nose: Nose normal. No mucosal edema or  rhinorrhea.  Mouth/Throat: Oropharynx is clear and moist and mucous membranes are normal. No oropharyngeal exudate.  Eyes: Conjunctivae and lids are normal. Pupils are equal, round, and reactive to light.  Neck: Trachea normal. No tracheal deviation present. No thyromegaly present.  Cardiovascular: Normal rate, regular rhythm, S1 normal, S2 normal and normal heart sounds.  No murmur heard. Pulmonary/Chest: Effort normal. No stridor. No tachypnea. No respiratory distress. Sherry Riley has no wheezes. Sherry Riley has no rales. Sherry Riley exhibits no tenderness.  Abdominal: Soft. Sherry Riley exhibits no distension and no mass. There is no hepatosplenomegaly. There is  no tenderness. There is no rebound and no guarding.  Musculoskeletal: Sherry Riley exhibits no edema or tenderness.  Lymphadenopathy:       Head (right side): No tonsillar adenopathy present.       Head (left side): No tonsillar adenopathy present.    Sherry Riley has no cervical adenopathy.    Sherry Riley has no axillary adenopathy.  Neurological: Sherry Riley is alert. Gait normal.  Skin: Rash (Approximately 12 erythematous nontender nonindurated healing ulcerative lesions affecting perioral region.) noted. Sherry Riley is not diaphoretic. No erythema. No pallor. Nails show no clubbing.  Approximately 15 cm length surgical scar left lower back  Psychiatric: Mood and affect normal.    Diagnostics: Allergy skin tests were performed.  Sherry Riley did not demonstrate any hypersensitivity against a screening panel of aeroallergens or foods.  Spirometry was performed and demonstrated an FEV1 of 2.11 @ 87 % of predicted. FEV1/FVC = 0.79.  Following the administration of nebulized albuterol her FEV1 did not improve.  Assessment and Plan:    1. Other allergic rhinitis   2. Seasonal allergic conjunctivitis   3. Asthma, mild intermittent, well-controlled   4. Perioral dermatitis   5. Recurrent infections     1.  Allergen avoidance measures?  2.  Continue topical metronidazole 0.75% cream twice a day to  face  3.  Continue Claritin 10 mg daily  4.  During "pollen season" start OTC Nasacort 1 spray each nostril 1 time per day  5.  Continue "action plan" for "bronchitis" flare up including the following:   A.  Symbicort 160 - 2 inhalations twice a day  B. Proair HFA - two inhalations every 4-6 hours if needed  C. DUONEB - nebulize every 4-6 hours if needed  6. CBC w/diff, IgA/G/M, anti-pneumo ab, anti-tetanus ab  7. Return to clinic in 12 weeks or earlier if problem  Sherry Riley appears to have significant cutaneous infections over the course of the past several years and Sherry Riley does appear to be getting a very good response to topical metronidazole regarding her perioral dermatitis.  I am going to check her blood given her history of significant infections to make sure were not dealing with a B-cell immune system abnormality.  I have given her some information about medical therapy to utilize during upper airway seasonal flares and I have given her an action plan to initiate the next time Sherry Riley develops what appears to be a component of situational asthma.  We will see how things go over the course of the next 12 weeks.  Allena Katz, MD Allergy / Immunology [place]

## 2018-01-07 NOTE — Patient Instructions (Addendum)
  1.  Allergen avoidance measures?  2.  Continue topical metronidazole 0.75% cream twice a day to face  3.  Continue Claritin 10 mg daily  4.  During "pollen season" start OTC Nasacort 1 spray each nostril 1 time per day  5.  Continue "action plan" for "bronchitis" flare up including the following:   A.  Symbicort 160 - 2 inhalations twice a day  B. Proair HFA - two inhalations every 4-6 hours if needed  C. DUONEB - nebulize every 4-6 hours if needed  6. CBC w/diff, IgA/G/M, anti-pneumo ab, anti-tetanus ab  7. Return to clinic in 12 weeks or earlier if problem

## 2018-01-11 ENCOUNTER — Encounter: Payer: Self-pay | Admitting: Allergy and Immunology

## 2018-01-14 LAB — STREP PNEUMONIAE 23 SEROTYPES IGG
PNEUMO AB TYPE 12 (12F): 1.1 ug/mL — AB (ref >1.3)
PNEUMO AB TYPE 14: 1.3 ug/mL — AB (ref >1.3)
PNEUMO AB TYPE 17 (17F): 5.6 ug/mL (ref >1.3)
PNEUMO AB TYPE 1: 0.8 ug/mL — AB (ref >1.3)
PNEUMO AB TYPE 20: 0.2 ug/mL — AB (ref >1.3)
PNEUMO AB TYPE 2: 1.9 ug/mL (ref >1.3)
PNEUMO AB TYPE 3: 0.5 ug/mL — AB (ref >1.3)
PNEUMO AB TYPE 51 (7F): 7.9 ug/mL (ref >1.3)
PNEUMO AB TYPE 54 (15B): 2.2 ug/mL (ref >1.3)
PNEUMO AB TYPE 56 (18C): 3.2 ug/mL (ref >1.3)
PNEUMO AB TYPE 57 (19A): 0.6 ug/mL — AB (ref >1.3)
Pneumo Ab Type 19 (19F)*: 0.9 ug/mL — ABNORMAL LOW (ref >1.3)
Pneumo Ab Type 22 (22F)*: <0.1 ug/mL — ABNORMAL LOW (ref >1.3)
Pneumo Ab Type 23 (23F)*: 1.3 ug/mL — ABNORMAL LOW (ref >1.3)
Pneumo Ab Type 34 (10A)*: >30.8 ug/mL (ref >1.3)
Pneumo Ab Type 4*: 2 ug/mL (ref >1.3)
Pneumo Ab Type 43 (11A)*: 1 ug/mL — ABNORMAL LOW (ref >1.3)
Pneumo Ab Type 5*: 7.1 ug/mL (ref >1.3)
Pneumo Ab Type 68 (9V)*: 0.8 ug/mL — ABNORMAL LOW (ref >1.3)
Pneumo Ab Type 70 (33F)*: >8.3 ug/mL (ref >1.3)
Pneumo Ab Type 8*: 2.9 ug/mL (ref >1.3)
Pneumo Ab Type 9 (9N)*: <0.1 ug/mL — ABNORMAL LOW (ref >1.3)

## 2018-01-14 LAB — CBC WITH DIFFERENTIAL/PLATELET
Basophils Absolute: 0.1 10*3/uL (ref 0.0–0.2)
Basos: 1 %
EOS (ABSOLUTE): 0.1 10*3/uL (ref 0.0–0.4)
EOS: 1 %
HEMATOCRIT: 42.2 % (ref 34.0–46.6)
HEMOGLOBIN: 14.5 g/dL (ref 11.1–15.9)
IMMATURE GRANS (ABS): 0 10*3/uL (ref 0.0–0.1)
Immature Granulocytes: 0 %
LYMPHS ABS: 3.9 10*3/uL — AB (ref 0.7–3.1)
LYMPHS: 36 %
MCH: 30.4 pg (ref 26.6–33.0)
MCHC: 34.4 g/dL (ref 31.5–35.7)
MCV: 89 fL (ref 79–97)
MONOCYTES: 10 %
Monocytes Absolute: 1.1 10*3/uL — ABNORMAL HIGH (ref 0.1–0.9)
Neutrophils Absolute: 5.7 10*3/uL (ref 1.4–7.0)
Neutrophils: 52 %
Platelets: 267 10*3/uL (ref 150–379)
RBC: 4.77 x10E6/uL (ref 3.77–5.28)
RDW: 14.2 % (ref 12.3–15.4)
WBC: 10.8 10*3/uL (ref 3.4–10.8)

## 2018-01-14 LAB — TETANUS ANTIBODY, IGG: TETANUS AB, IGG: 3.17 [IU]/mL (ref <0.10)

## 2018-01-14 LAB — IGG, IGA, IGM
IGM (IMMUNOGLOBULIN M), SRM: 128 mg/dL (ref 26–217)
IgA/Immunoglobulin A, Serum: 152 mg/dL (ref 87–352)
IgG (Immunoglobin G), Serum: 958 mg/dL (ref 700–1600)

## 2018-02-09 DIAGNOSIS — L710 Perioral dermatitis: Secondary | ICD-10-CM | POA: Diagnosis not present

## 2018-02-09 DIAGNOSIS — L820 Inflamed seborrheic keratosis: Secondary | ICD-10-CM | POA: Diagnosis not present

## 2018-03-24 DIAGNOSIS — J309 Allergic rhinitis, unspecified: Secondary | ICD-10-CM | POA: Diagnosis not present

## 2018-03-24 DIAGNOSIS — I10 Essential (primary) hypertension: Secondary | ICD-10-CM | POA: Diagnosis not present

## 2018-03-24 DIAGNOSIS — Z6841 Body Mass Index (BMI) 40.0 and over, adult: Secondary | ICD-10-CM | POA: Diagnosis not present

## 2018-03-24 DIAGNOSIS — E785 Hyperlipidemia, unspecified: Secondary | ICD-10-CM | POA: Diagnosis not present

## 2018-03-24 DIAGNOSIS — K08409 Partial loss of teeth, unspecified cause, unspecified class: Secondary | ICD-10-CM | POA: Diagnosis not present

## 2018-03-24 DIAGNOSIS — K219 Gastro-esophageal reflux disease without esophagitis: Secondary | ICD-10-CM | POA: Diagnosis not present

## 2018-03-24 DIAGNOSIS — R69 Illness, unspecified: Secondary | ICD-10-CM | POA: Diagnosis not present

## 2018-03-24 DIAGNOSIS — F419 Anxiety disorder, unspecified: Secondary | ICD-10-CM | POA: Diagnosis not present

## 2018-03-24 DIAGNOSIS — G8929 Other chronic pain: Secondary | ICD-10-CM | POA: Diagnosis not present

## 2018-04-07 ENCOUNTER — Encounter: Payer: Self-pay | Admitting: Allergy and Immunology

## 2018-04-07 ENCOUNTER — Ambulatory Visit: Payer: Medicare HMO | Admitting: Allergy and Immunology

## 2018-04-07 VITALS — BP 136/86 | HR 88 | Resp 22

## 2018-04-07 DIAGNOSIS — L710 Perioral dermatitis: Secondary | ICD-10-CM | POA: Diagnosis not present

## 2018-04-07 DIAGNOSIS — D72828 Other elevated white blood cell count: Secondary | ICD-10-CM

## 2018-04-07 DIAGNOSIS — B999 Unspecified infectious disease: Secondary | ICD-10-CM

## 2018-04-07 DIAGNOSIS — J4520 Mild intermittent asthma, uncomplicated: Secondary | ICD-10-CM | POA: Diagnosis not present

## 2018-04-07 DIAGNOSIS — J3089 Other allergic rhinitis: Secondary | ICD-10-CM

## 2018-04-07 NOTE — Patient Instructions (Addendum)
  1.  Continue topical metronidazole 0.75% cream twice a day to face  2.  Continue Claritin 10 mg daily  3.  Continue OTC Nasacort 1 spray each nostril 1 time per day  4.  Continue "action plan" for "bronchitis" flare up including the following:   A.  Symbicort 160 - 2 inhalations twice a day  B. Proair HFA - two inhalations every 4-6 hours if needed  C. DUONEB - nebulize every 4-6 hours if needed  5. Obtain Pneumovax. 4 weeks later check Pneumo-23 and CBC w/diff    6. Return to clinic in 6 months or earlier if problem

## 2018-04-07 NOTE — Progress Notes (Signed)
Follow-up Note  Referring Provider: Street, Sharon Mt, * Primary Provider: Street, Sharon Mt, MD Date of Office Visit: 04/07/2018  Subjective:   Sherry Riley (DOB: 1951/02/03) is a 67 y.o. female who returns to the [facility] on 04/07/2018 in re-evaluation of the following:  HPI: Keiasha returns to this clinic in reevaluation of her upper airway and lower airway and skin issues addressed during her initial evaluation of 07 January 2018.  She has had no issues with her respiratory tract since that visit.  She has not had a cold or any type of upper airway symptoms or recurrent bronchitis and has not had to activate her action plan for an asthma flare and has not had to use a short acting bronchodilator.  She continues to use Nasacort on a consistent basis as well as an antihistamine.  She has had no infections of her skin.  She has been using metronidazole on her face on a consistent basis.  Allergies as of 04/07/2018      Reactions   Atorvastatin Other (See Comments)   Myalgia   Lyrica [pregabalin] Rash   Actonel [risedronate Sodium]    Coreg [carvedilol]    Hydralazine Hcl Other (See Comments), Cough   Chest pain, dyspnea   Ace Inhibitors Rash   Diltiazem Hcl Itching, Rash   Diovan Hct [valsartan-hydrochlorothiazide] Rash   Valsartan Rash      Medication List      albuterol (2.5 MG/3ML) 0.083% nebulizer solution Commonly known as:  PROVENTIL Take 2.5 mg by nebulization every 4 (four) hours as needed for wheezing or shortness of breath.   PROAIR HFA 108 (90 Base) MCG/ACT inhaler Generic drug:  albuterol Inhale two puffs every four to six hours as needed for breathing problems.   amitriptyline 25 MG tablet Commonly known as:  ELAVIL TAKE 1 (ONE) TABLET AT BEDTIME   CALCIUM MAGNESIUM PO Take by mouth.   chlorthalidone 25 MG tablet Commonly known as:  HYGROTON Take 25 mg by mouth daily.   doxycycline 100 MG capsule Commonly known as:   VIBRAMYCIN TAKE 1 CAPSULE BY MOUTH EVERY DAY WITH FOOD   FISH OIL PO Take by mouth daily.   gabapentin 600 MG tablet Commonly known as:  NEURONTIN Take 600 mg by mouth 4 (four) times daily.   ipratropium 0.02 % nebulizer solution Commonly known as:  ATROVENT Can use one vial in nebulizer every four to six hours as needed for cough or wheeze.  Mix with Albuterol.   loratadine 10 MG tablet Commonly known as:  CLARITIN Take 10 mg by mouth daily.   LORazepam 0.5 MG tablet Commonly known as:  ATIVAN TAKE 1 TO 2 TABS EVERY DAY AS NEEDED   meclizine 12.5 MG tablet Commonly known as:  ANTIVERT TAKE 1 OR 2 TABLETS BY MOUTH EVERY 8 HOURS AS NEEDED FOR DIZZINESS   metroNIDAZOLE 0.75 % cream Commonly known as:  METROCREAM APPLY TO FACE TWICE A DAY   OSTEO BI-FLEX JOINT SHIELD PO Take by mouth.   pravastatin 40 MG tablet Commonly known as:  PRAVACHOL   ranitidine 150 MG tablet Commonly known as:  ZANTAC Take 150 mg by mouth 2 (two) times daily.   SYMBICORT 160-4.5 MCG/ACT inhaler Generic drug:  budesonide-formoterol Inhale 1 puff into the lungs 2 (two) times daily.   venlafaxine 37.5 MG tablet Commonly known as:  EFFEXOR Take 37.5 mg by mouth daily.       Past Medical History:  Diagnosis Date  .  Asthma   . Fibromyalgia   . GERD (gastroesophageal reflux disease)   . High blood pressure   . High cholesterol     Past Surgical History:  Procedure Laterality Date  . CYST EXCISION  2018   Back cyst  . HERNIA REPAIR  2015    Review of systems negative except as noted in HPI / PMHx or noted below:  Review of Systems  Constitutional: Negative.   HENT: Negative.   Eyes: Negative.   Respiratory: Negative.   Cardiovascular: Negative.   Gastrointestinal: Negative.   Genitourinary: Negative.   Musculoskeletal: Negative.   Skin: Negative.   Neurological: Negative.   Endo/Heme/Allergies: Negative.   Psychiatric/Behavioral: Negative.      Objective:   Vitals:     04/07/18 1452  BP: 136/86  Pulse: 88  Resp: (!) 22          Physical Exam  HENT:  Head: Normocephalic.  Right Ear: Tympanic membrane, external ear and ear canal normal.  Left Ear: Tympanic membrane, external ear and ear canal normal.  Nose: Nose normal. No mucosal edema or rhinorrhea.  Mouth/Throat: Uvula is midline, oropharynx is clear and moist and mucous membranes are normal. No oropharyngeal exudate.  Eyes: Conjunctivae are normal.  Neck: Trachea normal. No tracheal tenderness present. No tracheal deviation present. No thyromegaly present.  Cardiovascular: Normal rate, regular rhythm, S1 normal, S2 normal and normal heart sounds.  No murmur heard. Pulmonary/Chest: Breath sounds normal. No stridor. No respiratory distress. She has no wheezes. She has no rales.  Musculoskeletal: She exhibits no edema.  Lymphadenopathy:       Head (right side): No tonsillar adenopathy present.       Head (left side): No tonsillar adenopathy present.    She has no cervical adenopathy.  Neurological: She is alert.  Skin: No rash noted. She is not diaphoretic. No erythema. Nails show no clubbing.    Diagnostics:    Spirometry was performed and demonstrated an FEV1 of 1.91 at 79 % of predicted.  The patient had an Asthma Control Test with the following results: ACT Total Score: 25.    Results of blood tests obtained 07 January 2018 identified WBC 10.8, absolute eosinophil 100, absolute lymphocyte 3900, hemoglobin 14.5, platelet 267, IgG 958 mg/DL, IgM 128 mg/DL, IgA 152 mg/DL, and 13 out of 23 serotypes of pneumococcus with inadequate antibody titers.  Assessment and Plan:   1. Asthma, mild intermittent, well-controlled   2. Other allergic rhinitis   3. Perioral dermatitis   4. Recurrent infections   5. Other elevated white blood cell (WBC) count     1.  Continue topical metronidazole 0.75% cream twice a day to face  2.  Continue Claritin 10 mg daily  3.  Continue OTC Nasacort 1  spray each nostril 1 time per day  4.  Continue "action plan" for "bronchitis" flare up including the following:   A.  Symbicort 160 - 2 inhalations twice a day  B. Proair HFA - two inhalations every 4-6 hours if needed  C. DUONEB - nebulize every 4-6 hours if needed  5. Obtain Pneumovax. 4 weeks later check Pneumo-23 and CBC w/diff    6. Return to clinic in 6 months or earlier if problem  Mishelle is better and I have encouraged her to consistently use topical metronidazole and a nasal steroid and if required activate her action plan for lower airway inflammation.  She has a very inadequate response to pneumococcus and we will immunize her with  a Pneumovax and make sure that she can respond to this antigen.  As well, we will repeat her CBC with differential to see if her lymphocytosis has resolved and if it has progressed then we will need to up obtain a immunophenotyping study.  Allena Katz, MD Allergy / Immunology [place]

## 2018-04-08 ENCOUNTER — Encounter: Payer: Self-pay | Admitting: Allergy and Immunology

## 2018-04-22 DIAGNOSIS — R69 Illness, unspecified: Secondary | ICD-10-CM | POA: Diagnosis not present

## 2018-04-22 DIAGNOSIS — R7301 Impaired fasting glucose: Secondary | ICD-10-CM | POA: Diagnosis not present

## 2018-04-22 DIAGNOSIS — K219 Gastro-esophageal reflux disease without esophagitis: Secondary | ICD-10-CM | POA: Diagnosis not present

## 2018-04-22 DIAGNOSIS — M797 Fibromyalgia: Secondary | ICD-10-CM | POA: Diagnosis not present

## 2018-04-22 DIAGNOSIS — E785 Hyperlipidemia, unspecified: Secondary | ICD-10-CM | POA: Diagnosis not present

## 2018-04-22 DIAGNOSIS — G2581 Restless legs syndrome: Secondary | ICD-10-CM | POA: Diagnosis not present

## 2018-04-22 DIAGNOSIS — Z23 Encounter for immunization: Secondary | ICD-10-CM | POA: Diagnosis not present

## 2018-04-22 DIAGNOSIS — M159 Polyosteoarthritis, unspecified: Secondary | ICD-10-CM | POA: Diagnosis not present

## 2018-04-22 DIAGNOSIS — J302 Other seasonal allergic rhinitis: Secondary | ICD-10-CM | POA: Diagnosis not present

## 2018-04-22 DIAGNOSIS — I10 Essential (primary) hypertension: Secondary | ICD-10-CM | POA: Diagnosis not present

## 2018-04-28 DIAGNOSIS — E785 Hyperlipidemia, unspecified: Secondary | ICD-10-CM | POA: Diagnosis not present

## 2018-04-28 DIAGNOSIS — E348 Other specified endocrine disorders: Secondary | ICD-10-CM | POA: Diagnosis not present

## 2018-04-28 DIAGNOSIS — R7301 Impaired fasting glucose: Secondary | ICD-10-CM | POA: Diagnosis not present

## 2018-05-11 DIAGNOSIS — L820 Inflamed seborrheic keratosis: Secondary | ICD-10-CM | POA: Diagnosis not present

## 2018-05-11 DIAGNOSIS — L710 Perioral dermatitis: Secondary | ICD-10-CM | POA: Diagnosis not present

## 2018-08-09 DIAGNOSIS — J0100 Acute maxillary sinusitis, unspecified: Secondary | ICD-10-CM | POA: Diagnosis not present

## 2018-09-27 ENCOUNTER — Ambulatory Visit: Payer: Medicare HMO | Admitting: Allergy and Immunology

## 2018-09-27 ENCOUNTER — Encounter: Payer: Self-pay | Admitting: Allergy and Immunology

## 2018-09-27 VITALS — BP 142/92 | HR 76 | Resp 20

## 2018-09-27 DIAGNOSIS — J3089 Other allergic rhinitis: Secondary | ICD-10-CM

## 2018-09-27 DIAGNOSIS — L710 Perioral dermatitis: Secondary | ICD-10-CM

## 2018-09-27 DIAGNOSIS — D72828 Other elevated white blood cell count: Secondary | ICD-10-CM

## 2018-09-27 DIAGNOSIS — J4520 Mild intermittent asthma, uncomplicated: Secondary | ICD-10-CM

## 2018-09-27 DIAGNOSIS — B999 Unspecified infectious disease: Secondary | ICD-10-CM | POA: Diagnosis not present

## 2018-09-27 NOTE — Progress Notes (Signed)
Follow-up Note  Referring Provider: Street, Sharon Mt, * Primary Provider: Street, Sharon Mt, MD Date of Office Visit: 09/27/2018  Subjective:   Sherry Riley (DOB: Aug 11, 1951) is a 67 y.o. female who returns to the [facility] on 09/27/2018 in re-evaluation of the following:  HPI: Sherry Riley returns to this clinic in reevaluation of asthma and allergic rhinitis and facial dermatitis.  Her last visit to this clinic was 07 Apr 2018.  She has really had an excellent interval of time.  She states that she only had one event involving her respiratory tract with lots of sneezing and nasal congestion for which she was given an antibiotic which she did not use.  Otherwise, her nose has really been doing quite well with very rare use of Nasacort.  She has not had any episodes of "bronchitis" and has not had to use a short acting bronchodilator nor required a systemic steroid for any type of respiratory tract issue.  She has not been having any dermatitis on her face while consistently using topical metronidazole.  She did obtain the Pneumovax and most recently the flu vaccine.  She never did have follow-up blood tests as far she can remember in investigation of her response to the Pneumovax and her investigation of lymphocytosis.  She did have some form of blood test performed in September 2019 at [facility].  Allergies as of 09/27/2018      Reactions   Atorvastatin Other (See Comments)   Myalgia   Lyrica [pregabalin] Rash   Actonel [risedronate Sodium]    Coreg [carvedilol]    Hydralazine Hcl Other (See Comments), Cough   Chest pain, dyspnea   Ace Inhibitors Rash   Diltiazem Hcl Itching, Rash   Diovan Hct [valsartan-hydrochlorothiazide] Rash   Valsartan Rash      Medication List      albuterol (2.5 MG/3ML) 0.083% nebulizer solution Commonly known as:  PROVENTIL Take 2.5 mg by nebulization every 4 (four) hours as needed for wheezing or  shortness of breath.   PROAIR HFA 108 (90 Base) MCG/ACT inhaler Generic drug:  albuterol Inhale two puffs every four to six hours as needed for breathing problems.   amitriptyline 25 MG tablet Commonly known as:  ELAVIL TAKE 1 (ONE) TABLET AT BEDTIME   CALCIUM MAGNESIUM PO Take by mouth.   chlorthalidone 25 MG tablet Commonly known as:  HYGROTON Take 25 mg by mouth daily.   doxycycline 100 MG capsule Commonly known as:  VIBRAMYCIN TAKE 1 CAPSULE BY MOUTH EVERY DAY WITH FOOD   FISH OIL PO Take by mouth daily.   gabapentin 600 MG tablet Commonly known as:  NEURONTIN Take 600 mg by mouth 4 (four) times daily.   ipratropium 0.02 % nebulizer solution Commonly known as:  ATROVENT Can use one vial in nebulizer every four to six hours as needed for cough or wheeze.  Mix with Albuterol.   loratadine 10 MG tablet Commonly known as:  CLARITIN Take 10 mg by mouth daily.   LORazepam 0.5 MG tablet Commonly known as:  ATIVAN TAKE 1 TO 2 TABS EVERY DAY AS NEEDED   meclizine 12.5 MG tablet Commonly known as:  ANTIVERT TAKE 1 OR 2 TABLETS BY MOUTH EVERY 8 HOURS AS NEEDED FOR DIZZINESS   metroNIDAZOLE 0.75 % cream Commonly known as:  METROCREAM APPLY TO FACE TWICE A DAY   OSTEO BI-FLEX JOINT SHIELD PO Take by mouth.   pravastatin 40 MG tablet Commonly known as:  PRAVACHOL   ranitidine 150 MG tablet Commonly known as:  ZANTAC Take 150 mg by mouth 2 (two) times daily.   SYMBICORT 160-4.5 MCG/ACT inhaler Generic drug:  budesonide-formoterol Inhale 1 puff into the lungs 2 (two) times daily.   venlafaxine 37.5 MG tablet Commonly known as:  EFFEXOR Take 37.5 mg by mouth daily.       Past Medical History:  Diagnosis Date  . Asthma   . Fibromyalgia   . GERD (gastroesophageal reflux disease)   . High blood pressure   . High cholesterol     Past Surgical History:  Procedure Laterality Date  . CYST EXCISION  2018   Back cyst  . HERNIA REPAIR  2015    Review of  systems negative except as noted in HPI / PMHx or noted below:  Review of Systems  Constitutional: Negative.   HENT: Negative.   Eyes: Negative.   Respiratory: Negative.   Cardiovascular: Negative.   Gastrointestinal: Negative.   Genitourinary: Negative.   Musculoskeletal: Negative.   Skin: Negative.   Neurological: Negative.   Endo/Heme/Allergies: Negative.   Psychiatric/Behavioral: Negative.      Objective:   Vitals:   09/27/18 1459  BP: (!) 142/92  Pulse: 76  Resp: 20          Physical Exam  HENT:  Head: Normocephalic.  Right Ear: Tympanic membrane, external ear and ear canal normal.  Left Ear: Tympanic membrane, external ear and ear canal normal.  Nose: Nose normal. No mucosal edema or rhinorrhea.  Mouth/Throat: Uvula is midline, oropharynx is clear and moist and mucous membranes are normal. No oropharyngeal exudate.  Eyes: Conjunctivae are normal.  Neck: Trachea normal. No tracheal tenderness present. No tracheal deviation present. No thyromegaly present.  Cardiovascular: Normal rate, regular rhythm, S1 normal, S2 normal and normal heart sounds.  No murmur heard. Pulmonary/Chest: Breath sounds normal. No stridor. No respiratory distress. She has no wheezes. She has no rales.  Musculoskeletal: She exhibits no edema.  Lymphadenopathy:       Head (right side): No tonsillar adenopathy present.       Head (left side): No tonsillar adenopathy present.    She has no cervical adenopathy.  Neurological: She is alert.  Skin: No rash noted. She is not diaphoretic. No erythema. Nails show no clubbing.    Diagnostics:    Spirometry was performed and demonstrated an FEV1 of 1.68 at 70 % of predicted.  The patient had an Asthma Control Test with the following results: ACT Total Score: 25.    Assessment and Plan:   1. Asthma, mild intermittent, well-controlled   2. Other allergic rhinitis   3. Perioral dermatitis   4. Recurrent infections   5. Other elevated white  blood cell (WBC) count     1.  Continue topical metronidazole 0.75% cream twice a day to face  2.  Continue Claritin 10 mg if needed  3.  Continue OTC Nasacort 1 spray each nostril 1 time per day during periods of upper airway symptoms  4.  Continue "action plan" for "bronchitis" flare up including the following:   A.  Symbicort 160 - 2 inhalations twice a day  B. Proair HFA - two inhalations every 4-6 hours if needed  C. DUONEB - nebulize every 4-6 hours if needed  5.  Review results from recent blood tests.  Further testing?  6. Return to clinic in 6 months or earlier if problem  Overall Orly has really done quite well with her respiratory  track and her skin issues.  I would like for her to continue to utilize therapy as noted above and assuming she does well I will see her back in this clinic in 6 months.  I am going to review her recent blood tests.  Given the fact that she has done so well for such a relatively long interval time there may not be the need for any additional blood tests as long as there is a CBC identifying a stable lymphocyte count.  Allena Katz, MD Allergy / Immunology [place]

## 2018-09-27 NOTE — Patient Instructions (Addendum)
  1.  Continue topical metronidazole 0.75% cream twice a day to face  2.  Continue Claritin 10 mg if needed  3.  Continue OTC Nasacort 1 spray each nostril 1 time per day during periods of upper airway symptoms  4.  Continue "action plan" for "bronchitis" flare up including the following:   A.  Symbicort 160 - 2 inhalations twice a day  B. Proair HFA - two inhalations every 4-6 hours if needed  C. DUONEB - nebulize every 4-6 hours if needed  5.  Review results from recent blood tests.  Further testing?  6. Return to clinic in 6 months or earlier if problem

## 2018-09-28 ENCOUNTER — Encounter: Payer: Self-pay | Admitting: Allergy and Immunology

## 2018-10-05 DIAGNOSIS — L710 Perioral dermatitis: Secondary | ICD-10-CM | POA: Diagnosis not present

## 2018-10-05 DIAGNOSIS — L719 Rosacea, unspecified: Secondary | ICD-10-CM | POA: Diagnosis not present

## 2018-12-07 DIAGNOSIS — I10 Essential (primary) hypertension: Secondary | ICD-10-CM | POA: Diagnosis not present

## 2018-12-07 DIAGNOSIS — R7301 Impaired fasting glucose: Secondary | ICD-10-CM | POA: Diagnosis not present

## 2018-12-07 DIAGNOSIS — K580 Irritable bowel syndrome with diarrhea: Secondary | ICD-10-CM | POA: Diagnosis not present

## 2018-12-07 DIAGNOSIS — K219 Gastro-esophageal reflux disease without esophagitis: Secondary | ICD-10-CM | POA: Diagnosis not present

## 2018-12-07 DIAGNOSIS — M159 Polyosteoarthritis, unspecified: Secondary | ICD-10-CM | POA: Diagnosis not present

## 2018-12-07 DIAGNOSIS — G2581 Restless legs syndrome: Secondary | ICD-10-CM | POA: Diagnosis not present

## 2018-12-07 DIAGNOSIS — M797 Fibromyalgia: Secondary | ICD-10-CM | POA: Diagnosis not present

## 2018-12-07 DIAGNOSIS — Z0000 Encounter for general adult medical examination without abnormal findings: Secondary | ICD-10-CM | POA: Diagnosis not present

## 2018-12-07 DIAGNOSIS — R69 Illness, unspecified: Secondary | ICD-10-CM | POA: Diagnosis not present

## 2018-12-07 DIAGNOSIS — E785 Hyperlipidemia, unspecified: Secondary | ICD-10-CM | POA: Diagnosis not present

## 2018-12-08 DIAGNOSIS — E785 Hyperlipidemia, unspecified: Secondary | ICD-10-CM | POA: Diagnosis not present

## 2018-12-08 DIAGNOSIS — R739 Hyperglycemia, unspecified: Secondary | ICD-10-CM | POA: Diagnosis not present

## 2018-12-08 DIAGNOSIS — Z79899 Other long term (current) drug therapy: Secondary | ICD-10-CM | POA: Diagnosis not present

## 2018-12-08 DIAGNOSIS — G2581 Restless legs syndrome: Secondary | ICD-10-CM | POA: Diagnosis not present

## 2018-12-21 DIAGNOSIS — Z1231 Encounter for screening mammogram for malignant neoplasm of breast: Secondary | ICD-10-CM | POA: Diagnosis not present

## 2019-03-28 ENCOUNTER — Ambulatory Visit: Payer: Medicare HMO | Admitting: Allergy and Immunology

## 2019-04-14 ENCOUNTER — Ambulatory Visit: Payer: Medicare HMO | Admitting: Allergy and Immunology

## 2019-09-12 DIAGNOSIS — Z23 Encounter for immunization: Secondary | ICD-10-CM | POA: Diagnosis not present

## 2019-09-12 DIAGNOSIS — Z79899 Other long term (current) drug therapy: Secondary | ICD-10-CM | POA: Diagnosis not present

## 2019-09-12 DIAGNOSIS — M159 Polyosteoarthritis, unspecified: Secondary | ICD-10-CM | POA: Diagnosis not present

## 2019-09-12 DIAGNOSIS — K219 Gastro-esophageal reflux disease without esophagitis: Secondary | ICD-10-CM | POA: Diagnosis not present

## 2019-09-12 DIAGNOSIS — G2581 Restless legs syndrome: Secondary | ICD-10-CM | POA: Diagnosis not present

## 2019-09-12 DIAGNOSIS — K580 Irritable bowel syndrome with diarrhea: Secondary | ICD-10-CM | POA: Diagnosis not present

## 2019-09-12 DIAGNOSIS — R69 Illness, unspecified: Secondary | ICD-10-CM | POA: Diagnosis not present

## 2019-09-12 DIAGNOSIS — R7301 Impaired fasting glucose: Secondary | ICD-10-CM | POA: Diagnosis not present

## 2019-09-12 DIAGNOSIS — I10 Essential (primary) hypertension: Secondary | ICD-10-CM | POA: Diagnosis not present

## 2019-09-12 DIAGNOSIS — E785 Hyperlipidemia, unspecified: Secondary | ICD-10-CM | POA: Diagnosis not present

## 2019-11-22 DIAGNOSIS — Z6841 Body Mass Index (BMI) 40.0 and over, adult: Secondary | ICD-10-CM | POA: Diagnosis not present

## 2019-11-22 DIAGNOSIS — I10 Essential (primary) hypertension: Secondary | ICD-10-CM | POA: Diagnosis not present

## 2019-11-22 DIAGNOSIS — S46912A Strain of unspecified muscle, fascia and tendon at shoulder and upper arm level, left arm, initial encounter: Secondary | ICD-10-CM | POA: Diagnosis not present

## 2019-11-22 DIAGNOSIS — M7552 Bursitis of left shoulder: Secondary | ICD-10-CM | POA: Diagnosis not present

## 2019-11-22 DIAGNOSIS — R7301 Impaired fasting glucose: Secondary | ICD-10-CM | POA: Diagnosis not present

## 2019-12-16 ENCOUNTER — Other Ambulatory Visit: Payer: Self-pay | Admitting: Orthopedic Surgery

## 2019-12-16 DIAGNOSIS — M25512 Pain in left shoulder: Secondary | ICD-10-CM | POA: Diagnosis not present

## 2019-12-16 DIAGNOSIS — G8929 Other chronic pain: Secondary | ICD-10-CM

## 2020-01-07 ENCOUNTER — Other Ambulatory Visit: Payer: Self-pay

## 2020-01-07 ENCOUNTER — Ambulatory Visit
Admission: RE | Admit: 2020-01-07 | Discharge: 2020-01-07 | Disposition: A | Discharge status: Home / Self Care | Payer: Medicare HMO | Source: Ambulatory Visit | Attending: Orthopedic Surgery | Admitting: Orthopedic Surgery

## 2020-01-07 DIAGNOSIS — M25512 Pain in left shoulder: Secondary | ICD-10-CM

## 2020-01-07 DIAGNOSIS — S43432A Superior glenoid labrum lesion of left shoulder, initial encounter: Secondary | ICD-10-CM | POA: Diagnosis not present

## 2020-01-07 DIAGNOSIS — G8929 Other chronic pain: Secondary | ICD-10-CM

## 2020-01-07 DIAGNOSIS — S46012A Strain of muscle(s) and tendon(s) of the rotator cuff of left shoulder, initial encounter: Secondary | ICD-10-CM | POA: Diagnosis not present

## 2020-01-11 DIAGNOSIS — Z6841 Body Mass Index (BMI) 40.0 and over, adult: Secondary | ICD-10-CM | POA: Diagnosis not present

## 2020-01-11 DIAGNOSIS — M25512 Pain in left shoulder: Secondary | ICD-10-CM | POA: Diagnosis not present

## 2020-01-24 NOTE — H&P (Signed)
[facility] [address]   [place] [address] [phone] A Division of [facility]  RE: Krizia, Flight                                  [id number]         DOB: 1951/03/29 01/11/2020  Reason for visit:  Followup MRI of the left shoulder.    HPI: She has had persistent pain in her left shoulder.  It is waking her up at night.  It limits her activities of daily living.  It started with an injury roughly 6-8 weeks ago when she fell and caught her arm up in a bar stool and had a pop.  MRI demonstrates supraspinatus anterior full thickness tearing.  She also has an inferior labral injury.  She complains of pain over the superior aspect of her shoulder as well.    OBJECTIVE: The patient is a well appearing female, in no apparent distress.  She has tenderness at her [facility] joint, and also over her biceps tendon.  Negative apprehension.    IMAGES: MRI as noted above.    ASSESSMENT/PLAN:  I had a long talk with her about the risks and benefits of surgical options.  She would like to go forward with an arthroscopic extensive debridement, distal clavicle excision, subacromial decompression, labral repair, biceps tenodesis and supraspinatus repair.    Ernesta Amble.  Percell Miller, M.D.  Electronically verified by Ernesta Amble. Percell Miller, M.D. TDM:pmw Cc:  Christa See MD  fax [phone]  D 01/11/20 T 01/13/20

## 2020-01-26 NOTE — Patient Instructions (Addendum)
DUE TO COVID-19 ONLY ONE VISITOR IS ALLOWED TO COME WITH YOU AND STAY IN THE WAITING ROOM ONLY DURING PRE OP AND PROCEDURE DAY OF SURGERY. THE 1 VISITOR MAY VISIT WITH YOU AFTER SURGERY IN YOUR PRIVATE ROOM DURING VISITING HOURS ONLY!  YOU NEED TO HAVE A COVID 19 TEST ON:02/03/20 @2 :45 pm  , THIS TEST MUST BE DONE BEFORE SURGERY, COME  [address], [place] , [id number].  ([place]) ONCE YOUR COVID TEST IS COMPLETED, PLEASE BEGIN THE QUARANTINE INSTRUCTIONS AS OUTLINED IN YOUR HANDOUT.                [name]    Your procedure is scheduled on:02/07/20    Report to [facility] Main  Entrance   Report to admitting at: 7:30 AM     Call this number if you have problems the morning of surgery [phone]    Remember: [place], NO [place].     Take these medicines the morning of surgery with A SIP OF WATER: famotidine,gabapentin,loratadine,Use inhalers as usual.Meclizine as needed.              You may not have any metal on your body including hair pins and              piercings  Do not wear jewelry, make-up, lotions, powders or perfumes, deodorant             Do not wear nail polish on your fingernails.  Do not shave  48 hours prior to surgery.                Do not bring valuables to the hospital. [place].  Contacts, dentures or bridgework may not be worn into surgery.  Leave suitcase in the car. After surgery it may be brought to your room.     Patients discharged the day of surgery will not be allowed to drive home. IF YOU ARE HAVING SURGERY AND GOING HOME THE SAME DAY, YOU MUST HAVE AN ADULT TO DRIVE YOU HOME AND BE WITH YOU FOR 24 HOURS. YOU MAY GO HOME BY TAXI OR UBER OR ORTHERWISE, BUT AN ADULT MUST ACCOMPANY YOU HOME AND STAY WITH YOU FOR 24 HOURS.  Name and phone number of your driver:  Special Instructions: N/A               Please read over the following fact sheets you were given: _____________________________________________________________________             NO SOLID FOOD AFTER MIDNIGHT THE NIGHT PRIOR TO SURGERY. NOTHING BY MOUTH EXCEPT CLEAR LIQUIDS UNTIL: 7:00 am . PLEASE FINISH ENSURE DRINK PER SURGEON ORDER  WHICH NEEDS TO BE COMPLETED AT : 7:00 am.   CLEAR LIQUID DIET   Foods Allowed                                                                     Foods Excluded  Coffee and tea, regular and decaf  liquids that you cannot  Plain Jell-O any favor except red or purple                                           see through such as: Fruit ices (not with fruit pulp)                                     milk, soups, orange juice  Iced Popsicles                                    All solid food Carbonated beverages, regular and diet                                    Cranberry, grape and apple juices Sports drinks like Gatorade Lightly seasoned clear broth or consume(fat free) Sugar, honey syrup  Sample Menu Breakfast                                Lunch                                     Supper Cranberry juice                    Beef broth                            Chicken broth Jell-O                                     Grape juice                           Apple juice Coffee or tea                        Jell-O                                      Popsicle                                                Coffee or tea                        Coffee or tea  _____________________________________________________________________  [facility]- Preparing for Total Shoulder Arthroplasty    Before surgery, you can play an important role. Because skin is not sterile, your skin needs to be as free of germs as possible. You can reduce the number of germs on your skin by using the following products. . Benzoyl Peroxide Gel o Reduces the number of germs  present on the  skin o Applied twice a day to shoulder area starting two days before surgery    ==================================================================  Please follow these instructions carefully:  BENZOYL PEROXIDE 5% GEL  Please do not use if you have an allergy to benzoyl peroxide.   If your skin becomes reddened/irritated stop using the benzoyl peroxide.  Starting two days before surgery, apply as follows: 1. Apply benzoyl peroxide in the morning and at night. Apply after taking a shower. If you are not taking a shower clean entire shoulder front, back, and side along with the armpit with a clean wet washcloth.  2. Place a quarter-sized dollop on your shoulder and rub in thoroughly, making sure to cover the front, back, and side of your shoulder, along with the armpit.   2 days before ____ AM   ____ PM              1 day before ____ AM   ____ PM                         3. Do this twice a day for two days.  (Last application is the night before surgery, AFTER using the CHG soap as described below).  4. Do NOT apply benzoyl peroxide gel on the day of surgery.    [place] - Preparing for Surgery Before surgery, you can play an important role.  Because skin is not sterile, your skin needs to be as free of germs as possible.  You can reduce the number of germs on your skin by washing with CHG (chlorahexidine gluconate) soap before surgery.  CHG is an antiseptic cleaner which kills germs and bonds with the skin to continue killing germs even after washing. Please DO NOT use if you have an allergy to CHG or antibacterial soaps.  If your skin becomes reddened/irritated stop using the CHG and inform your nurse when you arrive at Short Stay. Do not shave (including legs and underarms) for at least 48 hours prior to the first CHG shower.  You may shave your face/neck. Please follow these instructions carefully:  1.  Shower with CHG Soap the night before surgery and the  morning of Surgery.  2.   If you choose to wash your hair, wash your hair first as usual with your  normal  shampoo.  3.  After you shampoo, rinse your hair and body thoroughly to remove the  shampoo.                           4.  Use CHG as you would any other liquid soap.  You can apply chg directly  to the skin and wash                       Gently with a scrungie or clean washcloth.  5.  Apply the CHG Soap to your body ONLY FROM THE NECK DOWN.   Do not use on face/ open                           Wound or open sores. Avoid contact with eyes, ears mouth and genitals (private parts).                       Wash face,  Genitals (private parts) with your normal soap.  6.  Wash thoroughly, paying special attention to the area where your surgery  will be performed.  7.  Thoroughly rinse your body with warm water from the neck down.  8.  DO NOT shower/wash with your normal soap after using and rinsing off  the CHG Soap.                9.  Pat yourself dry with a clean towel.            10.  Wear clean pajamas.            11.  Place clean sheets on your bed the night of your first shower and do not  sleep with pets. Day of Surgery : Do not apply any lotions/deodorants the morning of surgery.  Please wear clean clothes to the hospital/surgery center.  FAILURE TO FOLLOW THESE INSTRUCTIONS MAY RESULT IN THE CANCELLATION OF YOUR SURGERY PATIENT SIGNATURE_________________________________  NURSE SIGNATURE__________________________________  ________________________________________________________________________   Adam Phenix  An incentive spirometer is a tool that can help keep your lungs clear and active. This tool measures how well you are filling your lungs with each breath. Taking long deep breaths may help reverse or decrease the chance of developing breathing (pulmonary) problems (especially infection) following:  A long period of time when you are unable to move or be active. BEFORE THE PROCEDURE    If the spirometer includes an indicator to show your best effort, your nurse or respiratory therapist will set it to a desired goal.  If possible, sit up straight or lean slightly forward. Try not to slouch.  Hold the incentive spirometer in an upright position. INSTRUCTIONS FOR USE  1. Sit on the edge of your bed if possible, or sit up as far as you can in bed or on a chair. 2. Hold the incentive spirometer in an upright position. 3. Breathe out normally. 4. Place the mouthpiece in your mouth and seal your lips tightly around it. 5. Breathe in slowly and as deeply as possible, raising the piston or the ball toward the top of the column. 6. Hold your breath for 3-5 seconds or for as long as possible. Allow the piston or ball to fall to the bottom of the column. 7. Remove the mouthpiece from your mouth and breathe out normally. 8. Rest for a few seconds and repeat Steps 1 through 7 at least 10 times every 1-2 hours when you are awake. Take your time and take a few normal breaths between deep breaths. 9. The spirometer may include an indicator to show your best effort. Use the indicator as a goal to work toward during each repetition. 10. After each set of 10 deep breaths, practice coughing to be sure your lungs are clear. If you have an incision (the cut made at the time of surgery), support your incision when coughing by placing a pillow or rolled up towels firmly against it. Once you are able to get out of bed, walk around indoors and cough well. You may stop using the incentive spirometer when instructed by your caregiver.  RISKS AND COMPLICATIONS  Take your time so you do not get dizzy or light-headed.  If you are in pain, you may need to take or ask for pain medication before doing incentive spirometry. It is harder to take a deep breath if you are having pain. AFTER USE  Rest and breathe slowly and easily.  It can be helpful to keep track of a log of your  progress. Your caregiver  can provide you with a simple table to help with this. If you are using the spirometer at home, follow these instructions: [place] IF:   You are having difficultly using the spirometer.  You have trouble using the spirometer as often as instructed.  Your pain medication is not giving enough relief while using the spirometer.  You develop fever of 100.5 F (38.1 C) or higher. SEEK IMMEDIATE MEDICAL CARE IF:   You cough up bloody sputum that had not been present before.  You develop fever of 102 F (38.9 C) or greater.  You develop worsening pain at or near the incision site. MAKE SURE YOU:   Understand these instructions.  Will watch your condition.  Will get help right away if you are not doing well or get worse. Document Released: 03/09/2007 Document Revised: 01/19/2012 Document Reviewed: 05/10/2007 [facility] Patient Information 2014 [place], [place].   ________________________________________________________________________

## 2020-01-27 ENCOUNTER — Other Ambulatory Visit: Payer: Self-pay

## 2020-01-27 ENCOUNTER — Encounter (HOSPITAL_COMMUNITY): Payer: Self-pay

## 2020-01-27 ENCOUNTER — Encounter (HOSPITAL_COMMUNITY)
Admission: RE | Admit: 2020-01-27 | Discharge: 2020-01-27 | Disposition: A | Discharge status: Home / Self Care | Payer: Medicare HMO | Source: Ambulatory Visit | Attending: Orthopedic Surgery | Admitting: Orthopedic Surgery

## 2020-01-27 DIAGNOSIS — Z01818 Encounter for other preprocedural examination: Secondary | ICD-10-CM | POA: Insufficient documentation

## 2020-01-27 HISTORY — DX: Depression, unspecified: F32.A

## 2020-01-27 HISTORY — DX: Prediabetes: R73.03

## 2020-01-27 HISTORY — DX: Pneumonia, unspecified organism: J18.9

## 2020-01-27 LAB — BASIC METABOLIC PANEL
Anion gap: 12 (ref 5–15)
BUN: 16 mg/dL (ref 8–23)
CO2: 29 mmol/L (ref 22–32)
Calcium: 9.3 mg/dL (ref 8.9–10.3)
Chloride: 94 mmol/L — ABNORMAL LOW (ref 98–111)
Creatinine, Ser: 1.05 mg/dL — ABNORMAL HIGH (ref 0.44–1.00)
GFR calc Af Amer: >60 mL/min (ref >60)
GFR calc non Af Amer: 55 mL/min — ABNORMAL LOW (ref >60)
Glucose, Bld: 96 mg/dL (ref 70–99)
Potassium: 4.1 mmol/L (ref 3.5–5.1)
Sodium: 135 mmol/L (ref 135–145)

## 2020-01-27 LAB — CBC
HCT: 43.3 % (ref 36.0–46.0)
Hemoglobin: 14.1 g/dL (ref 12.0–15.0)
MCH: 30.4 pg (ref 26.0–34.0)
MCHC: 32.6 g/dL (ref 30.0–36.0)
MCV: 93.3 fL (ref 80.0–100.0)
Platelets: 278 10*3/uL (ref 150–400)
RBC: 4.64 MIL/uL (ref 3.87–5.11)
RDW: 14.3 % (ref 11.5–15.5)
WBC: 12 10*3/uL — ABNORMAL HIGH (ref 4.0–10.5)
nRBC: 0 % (ref 0.0–0.2)

## 2020-01-27 NOTE — Progress Notes (Signed)
PCP - Dr. Mallie Snooks.LOV:11/22/19 Cardiologist -   Chest x-ray -  EKG -  Stress Test -  ECHO -  Cardiac Cath -   Sleep Study -  CPAP -   Fasting Blood Sugar -  Checks Blood Sugar _____ times a day  Blood Thinner Instructions: Aspirin Instructions: Last Dose:  Anesthesia review:   Patient denies shortness of breath, fever, cough and chest pain at PAT appointment   Patient verbalized understanding of instructions that were given to them at the PAT appointment. Patient was also instructed that they will need to review over the PAT instructions again at home before surgery.

## 2020-02-03 ENCOUNTER — Other Ambulatory Visit (HOSPITAL_COMMUNITY)
Admission: RE | Admit: 2020-02-03 | Discharge: 2020-02-03 | Disposition: A | Discharge status: Home / Self Care | Payer: Medicare HMO | Source: Ambulatory Visit | Attending: Orthopedic Surgery | Admitting: Orthopedic Surgery

## 2020-02-03 DIAGNOSIS — Z20822 Contact with and (suspected) exposure to covid-19: Secondary | ICD-10-CM | POA: Insufficient documentation

## 2020-02-03 DIAGNOSIS — Z01812 Encounter for preprocedural laboratory examination: Secondary | ICD-10-CM | POA: Diagnosis not present

## 2020-02-03 LAB — SARS CORONAVIRUS 2 (TAT 6-24 HRS): SARS Coronavirus 2: NEGATIVE

## 2020-02-06 MED ORDER — DEXTROSE 5 % IV SOLN
3.0000 g | INTRAVENOUS | Status: AC
  Administered 2020-02-07: 11:00:00 3 g via INTRAVENOUS
  Filled 2020-02-06: qty 3

## 2020-02-07 ENCOUNTER — Inpatient Hospital Stay (HOSPITAL_COMMUNITY)
Admission: RE | Admit: 2020-02-07 | Discharge: 2020-02-11 | DRG: 166 | Disposition: A | Discharge status: Home / Self Care | Payer: Medicare HMO | Attending: Orthopedic Surgery | Admitting: Orthopedic Surgery

## 2020-02-07 ENCOUNTER — Encounter (HOSPITAL_COMMUNITY): Payer: Self-pay | Admitting: Orthopedic Surgery

## 2020-02-07 ENCOUNTER — Encounter (HOSPITAL_COMMUNITY)
Admission: RE | Disposition: A | Discharge status: Home / Self Care | Payer: Self-pay | Source: Home / Self Care | Attending: Orthopedic Surgery

## 2020-02-07 ENCOUNTER — Other Ambulatory Visit: Payer: Self-pay

## 2020-02-07 ENCOUNTER — Ambulatory Visit (HOSPITAL_COMMUNITY): Payer: Medicare HMO | Admitting: Certified Registered"

## 2020-02-07 ENCOUNTER — Ambulatory Visit (HOSPITAL_COMMUNITY): Payer: Medicare HMO | Admitting: Physician Assistant

## 2020-02-07 DIAGNOSIS — G8918 Other acute postprocedural pain: Secondary | ICD-10-CM | POA: Diagnosis not present

## 2020-02-07 DIAGNOSIS — J95821 Acute postprocedural respiratory failure: Principal | ICD-10-CM | POA: Diagnosis present

## 2020-02-07 DIAGNOSIS — E785 Hyperlipidemia, unspecified: Secondary | ICD-10-CM | POA: Diagnosis present

## 2020-02-07 DIAGNOSIS — I5031 Acute diastolic (congestive) heart failure: Secondary | ICD-10-CM | POA: Diagnosis not present

## 2020-02-07 DIAGNOSIS — Z20822 Contact with and (suspected) exposure to covid-19: Secondary | ICD-10-CM | POA: Diagnosis not present

## 2020-02-07 DIAGNOSIS — M75122 Complete rotator cuff tear or rupture of left shoulder, not specified as traumatic: Secondary | ICD-10-CM | POA: Diagnosis not present

## 2020-02-07 DIAGNOSIS — Z825 Family history of asthma and other chronic lower respiratory diseases: Secondary | ICD-10-CM

## 2020-02-07 DIAGNOSIS — M75102 Unspecified rotator cuff tear or rupture of left shoulder, not specified as traumatic: Secondary | ICD-10-CM

## 2020-02-07 DIAGNOSIS — J45909 Unspecified asthma, uncomplicated: Secondary | ICD-10-CM | POA: Diagnosis not present

## 2020-02-07 DIAGNOSIS — Z8249 Family history of ischemic heart disease and other diseases of the circulatory system: Secondary | ICD-10-CM

## 2020-02-07 DIAGNOSIS — Z79899 Other long term (current) drug therapy: Secondary | ICD-10-CM

## 2020-02-07 DIAGNOSIS — J9811 Atelectasis: Secondary | ICD-10-CM | POA: Diagnosis not present

## 2020-02-07 DIAGNOSIS — M7542 Impingement syndrome of left shoulder: Secondary | ICD-10-CM | POA: Diagnosis not present

## 2020-02-07 DIAGNOSIS — M19012 Primary osteoarthritis, left shoulder: Secondary | ICD-10-CM | POA: Diagnosis not present

## 2020-02-07 DIAGNOSIS — F329 Major depressive disorder, single episode, unspecified: Secondary | ICD-10-CM | POA: Diagnosis present

## 2020-02-07 DIAGNOSIS — Z801 Family history of malignant neoplasm of trachea, bronchus and lung: Secondary | ICD-10-CM

## 2020-02-07 DIAGNOSIS — J9601 Acute respiratory failure with hypoxia: Secondary | ICD-10-CM | POA: Diagnosis present

## 2020-02-07 DIAGNOSIS — R7303 Prediabetes: Secondary | ICD-10-CM | POA: Diagnosis not present

## 2020-02-07 DIAGNOSIS — F419 Anxiety disorder, unspecified: Secondary | ICD-10-CM | POA: Diagnosis present

## 2020-02-07 DIAGNOSIS — I5032 Chronic diastolic (congestive) heart failure: Secondary | ICD-10-CM | POA: Diagnosis present

## 2020-02-07 DIAGNOSIS — M7522 Bicipital tendinitis, left shoulder: Secondary | ICD-10-CM | POA: Diagnosis not present

## 2020-02-07 DIAGNOSIS — R7981 Abnormal blood-gas level: Secondary | ICD-10-CM

## 2020-02-07 DIAGNOSIS — E876 Hypokalemia: Secondary | ICD-10-CM | POA: Diagnosis not present

## 2020-02-07 DIAGNOSIS — C3492 Malignant neoplasm of unspecified part of left bronchus or lung: Secondary | ICD-10-CM | POA: Diagnosis present

## 2020-02-07 DIAGNOSIS — Z6841 Body Mass Index (BMI) 40.0 and over, adult: Secondary | ICD-10-CM

## 2020-02-07 DIAGNOSIS — K219 Gastro-esophageal reflux disease without esophagitis: Secondary | ICD-10-CM | POA: Diagnosis present

## 2020-02-07 DIAGNOSIS — I493 Ventricular premature depolarization: Secondary | ICD-10-CM | POA: Diagnosis present

## 2020-02-07 DIAGNOSIS — G4733 Obstructive sleep apnea (adult) (pediatric): Secondary | ICD-10-CM | POA: Diagnosis present

## 2020-02-07 DIAGNOSIS — Z833 Family history of diabetes mellitus: Secondary | ICD-10-CM

## 2020-02-07 DIAGNOSIS — E7800 Pure hypercholesterolemia, unspecified: Secondary | ICD-10-CM | POA: Diagnosis present

## 2020-02-07 DIAGNOSIS — S43005A Unspecified dislocation of left shoulder joint, initial encounter: Secondary | ICD-10-CM | POA: Diagnosis not present

## 2020-02-07 DIAGNOSIS — C3432 Malignant neoplasm of lower lobe, left bronchus or lung: Secondary | ICD-10-CM | POA: Diagnosis present

## 2020-02-07 DIAGNOSIS — W19XXXA Unspecified fall, initial encounter: Secondary | ICD-10-CM | POA: Diagnosis present

## 2020-02-07 DIAGNOSIS — Z8701 Personal history of pneumonia (recurrent): Secondary | ICD-10-CM

## 2020-02-07 DIAGNOSIS — I110 Hypertensive heart disease with heart failure: Secondary | ICD-10-CM | POA: Diagnosis present

## 2020-02-07 DIAGNOSIS — S46012A Strain of muscle(s) and tendon(s) of the rotator cuff of left shoulder, initial encounter: Secondary | ICD-10-CM | POA: Diagnosis present

## 2020-02-07 DIAGNOSIS — Z7951 Long term (current) use of inhaled steroids: Secondary | ICD-10-CM

## 2020-02-07 DIAGNOSIS — J4520 Mild intermittent asthma, uncomplicated: Secondary | ICD-10-CM | POA: Diagnosis present

## 2020-02-07 DIAGNOSIS — E662 Morbid (severe) obesity with alveolar hypoventilation: Secondary | ICD-10-CM | POA: Diagnosis not present

## 2020-02-07 DIAGNOSIS — Z87891 Personal history of nicotine dependence: Secondary | ICD-10-CM

## 2020-02-07 DIAGNOSIS — Z888 Allergy status to other drugs, medicaments and biological substances status: Secondary | ICD-10-CM

## 2020-02-07 DIAGNOSIS — J309 Allergic rhinitis, unspecified: Secondary | ICD-10-CM | POA: Diagnosis present

## 2020-02-07 DIAGNOSIS — R0902 Hypoxemia: Secondary | ICD-10-CM | POA: Diagnosis present

## 2020-02-07 DIAGNOSIS — M797 Fibromyalgia: Secondary | ICD-10-CM | POA: Diagnosis present

## 2020-02-07 DIAGNOSIS — M24112 Other articular cartilage disorders, left shoulder: Secondary | ICD-10-CM | POA: Diagnosis not present

## 2020-02-07 HISTORY — DX: Unspecified rotator cuff tear or rupture of left shoulder, not specified as traumatic: M75.102

## 2020-02-07 HISTORY — PX: SHOULDER ARTHROSCOPY WITH BANKART REPAIR: SHX5673

## 2020-02-07 SURGERY — SHOULDER ARTHROSCOPY WITH BANKART REPAIR
Anesthesia: General | Site: Shoulder | Laterality: Left

## 2020-02-07 MED ORDER — OXYCODONE HCL 5 MG PO TABS
5.0000 mg | ORAL_TABLET | ORAL | Status: DC | PRN
  Administered 2020-02-09 – 2020-02-11 (×2): 5 mg via ORAL
  Filled 2020-02-07 (×2): qty 1

## 2020-02-07 MED ORDER — ACETAMINOPHEN 500 MG PO TABS
1000.0000 mg | ORAL_TABLET | Freq: Once | ORAL | Status: AC
  Administered 2020-02-07: 1000 mg via ORAL
  Filled 2020-02-07: qty 2

## 2020-02-07 MED ORDER — CEFAZOLIN SODIUM-DEXTROSE 2-4 GM/100ML-% IV SOLN
INTRAVENOUS | Status: AC
  Filled 2020-02-07: qty 200

## 2020-02-07 MED ORDER — METOCLOPRAMIDE HCL 5 MG/ML IJ SOLN: 5.0000 mg | Freq: Three times a day (TID) | INTRAMUSCULAR | Status: DC | PRN

## 2020-02-07 MED ORDER — ALBUTEROL SULFATE (2.5 MG/3ML) 0.083% IN NEBU
2.5000 mg | INHALATION_SOLUTION | RESPIRATORY_TRACT | Status: DC | PRN
  Administered 2020-02-08: 2.5 mg via RESPIRATORY_TRACT
  Filled 2020-02-07: qty 3

## 2020-02-07 MED ORDER — ONDANSETRON HCL 4 MG/2ML IJ SOLN: 4.0000 mg | Freq: Four times a day (QID) | INTRAMUSCULAR | Status: DC | PRN

## 2020-02-07 MED ORDER — PROPOFOL 10 MG/ML IV BOLUS
INTRAVENOUS | Status: AC
  Filled 2020-02-07: qty 20

## 2020-02-07 MED ORDER — METHYLPREDNISOLONE ACETATE 40 MG/ML IJ SUSP
INTRAMUSCULAR | Status: AC
  Filled 2020-02-07: qty 2

## 2020-02-07 MED ORDER — DEXAMETHASONE SODIUM PHOSPHATE 10 MG/ML IJ SOLN
INTRAMUSCULAR | Status: AC
  Filled 2020-02-07: qty 1

## 2020-02-07 MED ORDER — SUGAMMADEX SODIUM 500 MG/5ML IV SOLN
INTRAVENOUS | Status: AC
  Filled 2020-02-07: qty 5

## 2020-02-07 MED ORDER — PHENYLEPHRINE HCL (PRESSORS) 10 MG/ML IV SOLN
INTRAVENOUS | Status: AC
  Filled 2020-02-07: qty 1

## 2020-02-07 MED ORDER — SODIUM CHLORIDE 0.9 % IR SOLN
Status: DC | PRN
  Administered 2020-02-07: 6000 mL

## 2020-02-07 MED ORDER — FENTANYL CITRATE (PF) 100 MCG/2ML IJ SOLN
50.0000 ug | INTRAMUSCULAR | Status: DC
  Administered 2020-02-07: 50 ug via INTRAVENOUS
  Filled 2020-02-07: qty 2

## 2020-02-07 MED ORDER — HYDROMORPHONE HCL 1 MG/ML IJ SOLN: 0.2500 mg | INTRAMUSCULAR | Status: DC | PRN

## 2020-02-07 MED ORDER — FLUTICASONE FUROATE-VILANTEROL 200-25 MCG/INH IN AEPB
1.0000 | INHALATION_SPRAY | Freq: Every day | RESPIRATORY_TRACT | Status: DC
  Administered 2020-02-10 – 2020-02-11 (×2): 1 via RESPIRATORY_TRACT
  Filled 2020-02-07 (×2): qty 28

## 2020-02-07 MED ORDER — BUPIVACAINE HCL 0.5 % IJ SOLN
INTRAMUSCULAR | Status: DC | PRN
  Administered 2020-02-07: 15 mL

## 2020-02-07 MED ORDER — DEXAMETHASONE SODIUM PHOSPHATE 10 MG/ML IJ SOLN
INTRAMUSCULAR | Status: DC | PRN
  Administered 2020-02-07: 4 mg via INTRAVENOUS

## 2020-02-07 MED ORDER — CELECOXIB 100 MG PO CAPS: 100.0000 mg | ORAL_CAPSULE | Freq: Two times a day (BID) | ORAL | 0 refills | Status: AC

## 2020-02-07 MED ORDER — SUCCINYLCHOLINE CHLORIDE 200 MG/10ML IV SOSY
PREFILLED_SYRINGE | INTRAVENOUS | Status: DC | PRN
  Administered 2020-02-07: 120 mg via INTRAVENOUS

## 2020-02-07 MED ORDER — METOCLOPRAMIDE HCL 5 MG PO TABS: 5.0000 mg | ORAL_TABLET | Freq: Three times a day (TID) | ORAL | Status: DC | PRN

## 2020-02-07 MED ORDER — MECLIZINE HCL 25 MG PO TABS: 12.5000 mg | ORAL_TABLET | Freq: Two times a day (BID) | ORAL | Status: DC | PRN

## 2020-02-07 MED ORDER — ROCURONIUM BROMIDE 10 MG/ML (PF) SYRINGE
PREFILLED_SYRINGE | INTRAVENOUS | Status: AC
  Filled 2020-02-07: qty 10

## 2020-02-07 MED ORDER — PHENYLEPHRINE HCL-NACL 10-0.9 MG/250ML-% IV SOLN
INTRAVENOUS | Status: DC | PRN
  Administered 2020-02-07: 20 ug/min via INTRAVENOUS

## 2020-02-07 MED ORDER — POLYETHYLENE GLYCOL 3350 17 G PO PACK
17.0000 g | PACK | Freq: Every day | ORAL | Status: DC | PRN
  Filled 2020-02-07: qty 1

## 2020-02-07 MED ORDER — DOCUSATE SODIUM 100 MG PO CAPS
100.0000 mg | ORAL_CAPSULE | Freq: Two times a day (BID) | ORAL | Status: DC
  Administered 2020-02-07 – 2020-02-11 (×6): 100 mg via ORAL
  Filled 2020-02-07 (×8): qty 1

## 2020-02-07 MED ORDER — PRAVASTATIN SODIUM 40 MG PO TABS
40.0000 mg | ORAL_TABLET | Freq: Every day | ORAL | Status: DC
  Administered 2020-02-07 – 2020-02-11 (×5): 40 mg via ORAL
  Filled 2020-02-07 (×2): qty 2
  Filled 2020-02-07 (×3): qty 1

## 2020-02-07 MED ORDER — GABAPENTIN 400 MG PO CAPS: 800.0000 mg | ORAL_CAPSULE | Freq: Four times a day (QID) | ORAL | Status: DC | PRN

## 2020-02-07 MED ORDER — FAMOTIDINE 20 MG PO TABS
40.0000 mg | ORAL_TABLET | Freq: Every day | ORAL | Status: DC
  Administered 2020-02-08 – 2020-02-11 (×4): 40 mg via ORAL
  Filled 2020-02-07 (×5): qty 2

## 2020-02-07 MED ORDER — LIDOCAINE 2% (20 MG/ML) 5 ML SYRINGE
INTRAMUSCULAR | Status: AC
  Filled 2020-02-07: qty 5

## 2020-02-07 MED ORDER — LACTATED RINGERS IV SOLN: INTRAVENOUS | Status: DC

## 2020-02-07 MED ORDER — EPHEDRINE 5 MG/ML INJ
INTRAVENOUS | Status: AC
  Filled 2020-02-07: qty 10

## 2020-02-07 MED ORDER — ACETAMINOPHEN 325 MG PO TABS
325.0000 mg | ORAL_TABLET | Freq: Four times a day (QID) | ORAL | Status: DC | PRN
  Administered 2020-02-08: 500 mg via ORAL
  Administered 2020-02-09 – 2020-02-11 (×6): 650 mg via ORAL
  Filled 2020-02-07 (×6): qty 2

## 2020-02-07 MED ORDER — PROPOFOL 10 MG/ML IV BOLUS
INTRAVENOUS | Status: DC | PRN
  Administered 2020-02-07: 100 mg via INTRAVENOUS

## 2020-02-07 MED ORDER — FENTANYL CITRATE (PF) 250 MCG/5ML IJ SOLN
INTRAMUSCULAR | Status: DC | PRN
  Administered 2020-02-07: 50 ug via INTRAVENOUS

## 2020-02-07 MED ORDER — MIDAZOLAM HCL 2 MG/2ML IJ SOLN
1.0000 mg | INTRAMUSCULAR | Status: DC
  Administered 2020-02-07: 1 mg via INTRAVENOUS
  Filled 2020-02-07: qty 2

## 2020-02-07 MED ORDER — FENTANYL CITRATE (PF) 100 MCG/2ML IJ SOLN
INTRAMUSCULAR | Status: AC
  Filled 2020-02-07: qty 2

## 2020-02-07 MED ORDER — CHLORHEXIDINE GLUCONATE 4 % EX LIQD: 60.0000 mL | Freq: Once | CUTANEOUS | Status: DC

## 2020-02-07 MED ORDER — ONDANSETRON HCL 4 MG PO TABS: 4.0000 mg | ORAL_TABLET | Freq: Three times a day (TID) | ORAL | 0 refills | Status: DC | PRN

## 2020-02-07 MED ORDER — EPHEDRINE SULFATE-NACL 50-0.9 MG/10ML-% IV SOSY
PREFILLED_SYRINGE | INTRAVENOUS | Status: DC | PRN
  Administered 2020-02-07: 5 mg via INTRAVENOUS

## 2020-02-07 MED ORDER — IPRATROPIUM BROMIDE 0.02 % IN SOLN
0.5000 mg | RESPIRATORY_TRACT | Status: DC | PRN
  Administered 2020-02-08: 0.5 mg via RESPIRATORY_TRACT
  Filled 2020-02-07: qty 2.5

## 2020-02-07 MED ORDER — BUPIVACAINE HCL 0.25 % IJ SOLN
INTRAMUSCULAR | Status: AC
  Filled 2020-02-07: qty 1

## 2020-02-07 MED ORDER — SUGAMMADEX SODIUM 200 MG/2ML IV SOLN
INTRAVENOUS | Status: DC | PRN
  Administered 2020-02-07: 300 mg via INTRAVENOUS

## 2020-02-07 MED ORDER — METHOCARBAMOL 1000 MG/10ML IJ SOLN
500.0000 mg | Freq: Four times a day (QID) | INTRAVENOUS | Status: DC | PRN
  Filled 2020-02-07: qty 5

## 2020-02-07 MED ORDER — FENTANYL CITRATE (PF) 100 MCG/2ML IJ SOLN
25.0000 ug | INTRAMUSCULAR | Status: DC | PRN
  Administered 2020-02-07: 50 ug via INTRAVENOUS

## 2020-02-07 MED ORDER — LIDOCAINE 2% (20 MG/ML) 5 ML SYRINGE
INTRAMUSCULAR | Status: DC | PRN
  Administered 2020-02-07: 40 mg via INTRAVENOUS

## 2020-02-07 MED ORDER — ONDANSETRON HCL 4 MG/2ML IJ SOLN
INTRAMUSCULAR | Status: AC
  Filled 2020-02-07: qty 2

## 2020-02-07 MED ORDER — SUCCINYLCHOLINE CHLORIDE 200 MG/10ML IV SOSY
PREFILLED_SYRINGE | INTRAVENOUS | Status: AC
  Filled 2020-02-07: qty 10

## 2020-02-07 MED ORDER — ONDANSETRON HCL 4 MG/2ML IJ SOLN: 4.0000 mg | Freq: Once | INTRAMUSCULAR | Status: DC | PRN

## 2020-02-07 MED ORDER — ONDANSETRON HCL 4 MG/2ML IJ SOLN
INTRAMUSCULAR | Status: DC | PRN
  Administered 2020-02-07: 4 mg via INTRAVENOUS

## 2020-02-07 MED ORDER — ONDANSETRON HCL 4 MG PO TABS: 4.0000 mg | ORAL_TABLET | Freq: Four times a day (QID) | ORAL | Status: DC | PRN

## 2020-02-07 MED ORDER — BACLOFEN 10 MG PO TABS: 10.0000 mg | ORAL_TABLET | Freq: Three times a day (TID) | ORAL | 0 refills | Status: DC | PRN

## 2020-02-07 MED ORDER — LORATADINE 10 MG PO TABS
10.0000 mg | ORAL_TABLET | Freq: Every day | ORAL | Status: DC
  Administered 2020-02-08 – 2020-02-11 (×4): 10 mg via ORAL
  Filled 2020-02-07 (×4): qty 1

## 2020-02-07 MED ORDER — METHOCARBAMOL 500 MG PO TABS: 500.0000 mg | ORAL_TABLET | Freq: Four times a day (QID) | ORAL | Status: DC | PRN

## 2020-02-07 MED ORDER — VENLAFAXINE HCL ER 75 MG PO CP24
75.0000 mg | ORAL_CAPSULE | Freq: Every day | ORAL | Status: DC
  Administered 2020-02-07 – 2020-02-11 (×5): 75 mg via ORAL
  Filled 2020-02-07 (×6): qty 1

## 2020-02-07 MED ORDER — ACETAMINOPHEN 500 MG PO TABS
1000.0000 mg | ORAL_TABLET | Freq: Three times a day (TID) | ORAL | Status: AC
  Administered 2020-02-07 – 2020-02-08 (×4): 1000 mg via ORAL
  Filled 2020-02-07 (×4): qty 2

## 2020-02-07 MED ORDER — LORAZEPAM 0.5 MG PO TABS: 0.5000 mg | ORAL_TABLET | Freq: Two times a day (BID) | ORAL | Status: DC | PRN

## 2020-02-07 MED ORDER — OXYCODONE HCL 5 MG PO TABS: 5.0000 mg | ORAL_TABLET | ORAL | 0 refills | Status: AC | PRN

## 2020-02-07 MED ORDER — ROCURONIUM BROMIDE 10 MG/ML (PF) SYRINGE
PREFILLED_SYRINGE | INTRAVENOUS | Status: DC | PRN
  Administered 2020-02-07: 10 mg via INTRAVENOUS
  Administered 2020-02-07: 50 mg via INTRAVENOUS
  Administered 2020-02-07: 20 mg via INTRAVENOUS

## 2020-02-07 MED ORDER — BUPIVACAINE LIPOSOME 1.3 % IJ SUSP
INTRAMUSCULAR | Status: DC | PRN
  Administered 2020-02-07: 10 mL via PERINEURAL

## 2020-02-07 SURGICAL SUPPLY — 67 items
ANCHOR SUT BIO SW 4.75 W/FIB (Anchor) ×2 IMPLANT
ANCHOR SUT BIO SW 4.75X19.1 (Anchor) ×2 IMPLANT
ANCHOR SUT BIOCOMP LK 2.9X12.5 (Anchor) ×2 IMPLANT
BLADE EXCALIBUR 4.0X13 (MISCELLANEOUS) ×2 IMPLANT
BLADE SURG 15 STRL LF DISP TIS (BLADE) IMPLANT
BLADE SURG 15 STRL SS (BLADE)
BURR OVAL 8 FLU 4.0X13 (MISCELLANEOUS) ×2 IMPLANT
BURR OVAL 8 FLU 5.0X13 (MISCELLANEOUS) IMPLANT
CANNULA 5.75X71 LONG (CANNULA) IMPLANT
CANNULA TWIST IN 8.25X7CM (CANNULA) ×2 IMPLANT
CANNULA TWIST IN 8.25X9CM (CANNULA) IMPLANT
CHLORAPREP W/TINT 26 (MISCELLANEOUS) ×2 IMPLANT
CLSR STERI-STRIP ANTIMIC 1/2X4 (GAUZE/BANDAGES/DRESSINGS) IMPLANT
COVER WAND RF STERILE (DRAPES) IMPLANT
DECANTER SPIKE VIAL GLASS SM (MISCELLANEOUS) IMPLANT
DISSECTOR  3.8MM X 13CM (MISCELLANEOUS)
DISSECTOR 3.8MM X 13CM (MISCELLANEOUS) IMPLANT
DRAPE IMP U-DRAPE 54X76 (DRAPES) ×2 IMPLANT
DRAPE INCISE IOBAN 66X45 STRL (DRAPES) ×2 IMPLANT
DRAPE SHOULDER BEACH CHAIR (DRAPES) ×2 IMPLANT
DRAPE U-SHAPE 47X51 STRL (DRAPES) ×2 IMPLANT
DRSG EMULSION OIL 3X3 NADH (GAUZE/BANDAGES/DRESSINGS) ×2 IMPLANT
DRSG PAD ABDOMINAL 8X10 ST (GAUZE/BANDAGES/DRESSINGS) ×2 IMPLANT
GAUZE 4X4 16PLY RFD (DISPOSABLE) IMPLANT
GAUZE SPONGE 4X4 12PLY STRL (GAUZE/BANDAGES/DRESSINGS) ×2 IMPLANT
GAUZE XEROFORM 1X8 LF (GAUZE/BANDAGES/DRESSINGS) ×2 IMPLANT
GLOVE BIO SURGEON STRL SZ7.5 (GLOVE) ×4 IMPLANT
GLOVE BIOGEL PI IND STRL 8 (GLOVE) ×2 IMPLANT
GLOVE BIOGEL PI INDICATOR 8 (GLOVE) ×2
GOWN STRL REUS W/ TWL LRG LVL3 (GOWN DISPOSABLE) ×2 IMPLANT
GOWN STRL REUS W/ TWL XL LVL3 (GOWN DISPOSABLE) ×1 IMPLANT
GOWN STRL REUS W/TWL LRG LVL3 (GOWN DISPOSABLE) ×2
GOWN STRL REUS W/TWL XL LVL3 (GOWN DISPOSABLE) ×1
IV NS IRRIG 3000ML ARTHROMATIC (IV SOLUTION) ×20 IMPLANT
KIT BASIN OR (CUSTOM PROCEDURE TRAY) ×2 IMPLANT
KIT PUSHLOCK 2.9 HIP (KITS) ×2 IMPLANT
KIT SHOULDER TRACTION (DRAPES) ×2 IMPLANT
LASSO 90 CVE QUICKPAS (DISPOSABLE) IMPLANT
MANIFOLD NEPTUNE II (INSTRUMENTS) ×2 IMPLANT
NEEDLE SCORPION MULTI FIRE (NEEDLE) ×2 IMPLANT
NS IRRIG 1000ML POUR BTL (IV SOLUTION) IMPLANT
PACK ARTHROSCOPY DSU (CUSTOM PROCEDURE TRAY) ×2 IMPLANT
PASSER SUT SWIFTSTITCH HIP CRT (INSTRUMENTS) ×2 IMPLANT
PORT APPOLLO RF 90DEGREE MULTI (SURGICAL WAND) ×2 IMPLANT
SLEEVE SCD COMPRESS KNEE MED (MISCELLANEOUS) IMPLANT
SLING ARM FOAM STRAP LRG (SOFTGOODS) IMPLANT
SLING ARM FOAM STRAP XLG (SOFTGOODS) ×2 IMPLANT
SLING ARM IMMOBILIZER XL (CAST SUPPLIES) IMPLANT
SPONGE LAP 4X18 RFD (DISPOSABLE) IMPLANT
SUCTION FRAZIER HANDLE 10FR (MISCELLANEOUS)
SUCTION TUBE FRAZIER 10FR DISP (MISCELLANEOUS) IMPLANT
SUPPORT WRAP ARM LG (MISCELLANEOUS) ×2 IMPLANT
SUT ETHILON 3 0 PS 1 (SUTURE) ×2 IMPLANT
SUT FIBERWIRE #2 38 T-5 BLUE (SUTURE)
SUT MNCRL AB 4-0 PS2 18 (SUTURE) IMPLANT
SUT VIC AB 0 CT1 27 (SUTURE)
SUT VIC AB 0 CT1 27XBRD ANBCTR (SUTURE) IMPLANT
SUTURE FIBERWR #2 38 T-5 BLUE (SUTURE) IMPLANT
SYR BULB 3OZ (MISCELLANEOUS) IMPLANT
TAPE LABRALWHITE 1.5X36 (TAPE) IMPLANT
TAPE PAPER 3X10 WHT MICROPORE (GAUZE/BANDAGES/DRESSINGS) ×2 IMPLANT
TAPE SUT LABRALTAP WHT/BLK (SUTURE) IMPLANT
TOWEL OR 17X26 10 PK STRL BLUE (TOWEL DISPOSABLE) ×2 IMPLANT
TUBE CONNECTING 20X1/4 (TUBING) IMPLANT
TUBING ARTHROSCOPY IRRIG 16FT (MISCELLANEOUS) ×2 IMPLANT
WATER STERILE IRR 1000ML POUR (IV SOLUTION) ×2 IMPLANT
YANKAUER SUCT BULB TIP NO VENT (SUCTIONS) IMPLANT

## 2020-02-07 NOTE — Anesthesia Procedure Notes (Signed)
Anesthesia Regional Block: Interscalene brachial plexus block   Pre-Anesthetic Checklist: ,, timeout performed, Correct Patient, Correct Site, Correct Laterality, Correct Procedure, Correct Position, site marked, Risks and benefits discussed,  Surgical consent,  Pre-op evaluation,  At surgeon's request and post-op pain management  Laterality: Left  Prep: chloraprep       Needles:  Injection technique: Single-shot  Needle Type: Echogenic Stimulator Needle     Needle Length: 9cm  Needle Gauge: 21   Needle insertion depth: 7 cm   Additional Needles:   Procedures:,,,, ultrasound used (permanent image in chart),,,,  Narrative:  Start time: 02/07/2020 9:05 AM End time: 02/07/2020 9:10 AM Injection made incrementally with aspirations every 5 mL.  Performed by: Personally  Anesthesiologist: Josephine Igo, MD  Additional Notes: Timeout performed. Patient sedated. Relevant anatomy ID'd using [place]. Incremental 2-16ml injection of LA with frequent aspiration. Patient tolerated procedure well.        Left Interscalene Block

## 2020-02-07 NOTE — Transfer of Care (Signed)
Immediate Anesthesia Transfer of Care Note  Patient: Sherry Riley  Procedure(s) Performed: SHOULDER ARTHROSCOPY WITH BANKART REPAIR (Left Shoulder)  Patient Location: PACU  Anesthesia Type:General  Level of Consciousness: awake, alert  and patient cooperative  Airway & Oxygen Therapy: Patient Spontanous Breathing and Patient connected to face mask oxygen  Post-op Assessment: Report given to RN and Post -op Vital signs reviewed and stable  Post vital signs: Reviewed and stable  Last Vitals:  Vitals Value Taken Time  BP 147/84 02/07/20 1245  Temp 36.4 C 02/07/20 1245  Pulse 79 02/07/20 1250  Resp 14 02/07/20 1250  SpO2 97 % 02/07/20 1250  Vitals shown include unvalidated device data.  Last Pain:  Vitals:   02/07/20 1245  TempSrc:   PainSc: 0-No pain         Complications: No apparent anesthesia complications

## 2020-02-07 NOTE — Anesthesia Preprocedure Evaluation (Signed)
Anesthesia Evaluation  Patient identified by MRN, date of birth, ID band Patient awake    Reviewed: Allergy & Precautions, NPO status , Patient's Chart, lab work & pertinent test results  Airway Mallampati: II  TM Distance: >3 FB Neck ROM: Full    Dental no notable dental hx. (+) Teeth Intact   Pulmonary asthma , pneumonia, resolved, former smoker,    Pulmonary exam normal breath sounds clear to auscultation       Cardiovascular hypertension, Pt. on medications Normal cardiovascular exam Rhythm:Regular Rate:Normal     Neuro/Psych PSYCHIATRIC DISORDERS Depression  Neuromuscular disease    GI/Hepatic Neg liver ROS, GERD  Medicated and Controlled,  Endo/Other  Morbid obesityHyperlipidemia Pre Diabetes  Renal/GU negative Renal ROS  negative genitourinary   Musculoskeletal  (+) Fibromyalgia -Left Shoulder rotator Cuff Tear   Abdominal (+) + obese,   Peds  Hematology negative hematology ROS (+)   Anesthesia Other Findings   Reproductive/Obstetrics                             Anesthesia Physical Anesthesia Plan  ASA: III  Anesthesia Plan: General   Post-op Pain Management:  Regional for Post-op pain   Induction: Intravenous  PONV Risk Score and Plan: 4 or greater and Ondansetron and Dexamethasone  Airway Management Planned: Oral ETT  Additional Equipment:   Intra-op Plan:   Post-operative Plan: Extubation in OR  Informed Consent: I have reviewed the patients History and Physical, chart, labs and discussed the procedure including the risks, benefits and alternatives for the proposed anesthesia with the patient or authorized representative who has indicated his/her understanding and acceptance.     Dental advisory given  Plan Discussed with: CRNA and Surgeon  Anesthesia Plan Comments:         Anesthesia Quick Evaluation

## 2020-02-07 NOTE — Op Note (Signed)
02/07/2020  2:14 PM  PATIENT:  Sherry Riley    PRE-OPERATIVE DIAGNOSIS:  LEFT SHOULDER ROTATOR CUFF TEAR, DISLOCATION  POST-OPERATIVE DIAGNOSIS:  Same  PROCEDURE:  SHOULDER ARTHROSCOPY WITH BANKART REPAIR  SURGEON:  Renette Butters, MD  ASSISTANT: Nehemiah Massed, PA-C, he was present and scrubbed throughout the case, critical for completion in a timely fashion, and for retraction, instrumentation, and closure.   ANESTHESIA:   General  PREOPERATIVE INDICATIONS:  Sherry Riley is a  69 y.o. female with a diagnosis of LEFT SHOULDER ROTATOR CUFF TEAR, DISLOCATION who failed conservative measures and elected for surgical management.    The risks benefits and alternatives were discussed with the patient preoperatively including but not limited to the risks of infection, bleeding, nerve injury, cardiopulmonary complications, the need for revision surgery, among others, and the patient was willing to proceed.  OPERATIVE IMPLANTS: arthrex anchors  OPERATIVE FINDINGS: Full thickness supra tear  BLOOD LOSS: minimal  COMPLICATIONS: none  OPERATIVE PROCEDURE:  Patient was identified in the preoperative holding area and site was marked by me He was transported to the operating theater and placed on the table in beach chair position taking care to pad all bony prominences. After a preincinduction time out anesthesia was induced. The left upper extremity was prepped and draped in normal sterile fashion and a pre-incision timeout was performed. Sherry Riley received ancef for preoperative antibiotics.   Initially made a posterior arthroscopic portal and inserted the arthroscope into the glenohumeral joint. tour of the joint demonstrated the above operative findings  I created an anterior portal just lateral to the coracoid under direct visualization using a spinal needle.  I performed an extensive debridement of the scarred synovial tissue and remaining structures  I performed a Loop and  tack biceps tenodesis with a 2.9 pushlock anchor. I used a combination of biter and shaver to release the biceps tendon from the superior labrum and then used the shaver to debride the superior labrum to a smooth rim.  Anterior labrum was stable and did not require an anchor. I debrided the injury here.   I then introduced the arthroscope into the subacromial space and brought the shaver into the anterior portal. I debrided the bursa for appropriate visualization.  I then performed a subacromial decompression using combination of the shaver ArthroCare and burr using a cutting block technique. As happy with the final elevation of the subacromial space on multiple portal views.   Next I turned my attention to the distal clavicle and through the anterior portal using the bur and shaver I was able to perform a distal clavicle excision. I then switched portals and inserted the arthroscope into the anterior portal and was happy with an appropriate resection of the distal clavicle.  I debrided the rotator cuff tear and examined its mobility. There was a roughly 5 millimeters tear and I debrided the tear here. I then debrided the footprint to good bone for placement of the tendon. I performed a dual row 1x1 repair of this tendon  I was happy with the tendon apposition and there was minimal to no dog ear.  Next I removed all arthroscopic equipment expressed all fluid and closed the portals with a nylon stitch. A sterile dressing was applied the patient was taken the PACU in stable condition.  POST OPERATIVE PLAN: The patient will be in a sling full-time and keep the dressings clean dry and intact. DVT prophylaxis will consist of early ambulation

## 2020-02-07 NOTE — Interval H&P Note (Signed)
I participated in the care of this patient and agree with the above history, physical and evaluation. I performed a review of the history and a physical exam as detailed   Hadas Jessop Daniel Riven Mabile MD  

## 2020-02-07 NOTE — Discharge Instructions (Signed)
Maintain sling until follow up.  Diet: As you were doing prior to hospitalization   Dressing:  Keep dressings on and dry.  You may remove dressings in 3 days and shower over incisions.  No Bath / submerging incisions.  Cover with clean Band-Aid.  Activity:  Increase activity slowly as tolerated, but follow the weight bearing instructions below.  The rules on driving is that you can not be taking narcotics while you drive, and you must feel in control of the vehicle.    Weight Bearing:  Do not lift or bear weight with affected arm.  You may straighten and bend arm at the elbow.  Pain:  Take medications if needed as directed with a goal of transitioning to over the counter medicines.  Stop prescription pain medication as soon as you are able.  Constipation: Narcotic pain medications cause constipation.  Reduce use or stop taking if you become constipated.  Drink plenty of fluids (prune juice may be helpful) and high fiber foods.  You may use a stool softener such as -  Colace (over the counter) 100 mg by mouth twice a day  And/or Miralax (over the counter) for constipation as needed.    Itching:  If you experience itching with your medications, try taking only a single pain pill, or even half a pain pill at a time.  You can also use benadryl over the counter for itching or also to help with sleep.   Precautions:  If you experience chest pain or shortness of breath - call 911 immediately for transfer to the hospital emergency department!!  If you develop a fever greater that 101 F, purulent drainage from wound, increased redness or drainage from wound, or calf pain -- Call the office at [phone]                                                 Follow- Up Appointment:  Please call for an appointment to be seen in 2 weeks [place] - [phone]   General Anesthesia, Adult, Care After This sheet gives you information about how to care for yourself after your procedure. Your health care  provider may also give you more specific instructions. If you have problems or questions, contact your health care provider. What can I expect after the procedure? After the procedure, the following side effects are common:  Pain or discomfort at the IV site.  Nausea.  Vomiting.  Sore throat.  Trouble concentrating.  Feeling cold or chills.  Weak or tired.  Sleepiness and fatigue.  Soreness and body aches. These side effects can affect parts of the body that were not involved in surgery. Follow these instructions at home:  For at least 24 hours after the procedure:  Have a responsible adult stay with you. It is important to have someone help care for you until you are awake and alert.  Rest as needed.  Do not: ? Participate in activities in which you could fall or become injured. ? Drive. ? Use heavy machinery. ? Drink alcohol. ? Take sleeping pills or medicines that cause drowsiness. ? Make important decisions or sign legal documents. ? Take care of children on your own. Eating and drinking  Follow any instructions from your health care provider about eating or drinking restrictions.  When you feel hungry, start by eating small amounts of foods  that are soft and easy to digest (bland), such as toast. Gradually return to your regular diet.  Drink enough fluid to keep your urine pale yellow.  If you vomit, rehydrate by drinking water, juice, or clear broth. General instructions  If you have sleep apnea, surgery and certain medicines can increase your risk for breathing problems. Follow instructions from your health care provider about wearing your sleep device: ? Anytime you are sleeping, including during daytime naps. ? While taking prescription pain medicines, sleeping medicines, or medicines that make you drowsy.  Return to your normal activities as told by your health care provider. Ask your health care provider what activities are safe for you.  Take  over-the-counter and prescription medicines only as told by your health care provider.  If you smoke, do not smoke without supervision.  Keep all follow-up visits as told by your health care provider. This is important. Contact a health care provider if:  You have nausea or vomiting that does not get better with medicine.  You cannot eat or drink without vomiting.  You have pain that does not get better with medicine.  You are unable to pass urine.  You develop a skin rash.  You have a fever.  You have redness around your IV site that gets worse. Get help right away if:  You have difficulty breathing.  You have chest pain.  You have blood in your urine or stool, or you vomit blood. Summary  After the procedure, it is common to have a sore throat or nausea. It is also common to feel tired.  Have a responsible adult stay with you for the first 24 hours after general anesthesia. It is important to have someone help care for you until you are awake and alert.  When you feel hungry, start by eating small amounts of foods that are soft and easy to digest (bland), such as toast. Gradually return to your regular diet.  Drink enough fluid to keep your urine pale yellow.  Return to your normal activities as told by your health care provider. Ask your health care provider what activities are safe for you. This information is not intended to replace advice given to you by your health care provider. Make sure you discuss any questions you have with your health care provider. Document Revised: 10/30/2017 Document Reviewed: 06/12/2017 Elsevier Patient Education  [employer].

## 2020-02-07 NOTE — Anesthesia Procedure Notes (Signed)
Procedure Name: Intubation Date/Time: 02/07/2020 10:36 AM Performed by: Eben Burow, CRNA Pre-anesthesia Checklist: Patient identified, Emergency Drugs available, Suction available, Patient being monitored and Timeout performed Patient Re-evaluated:Patient Re-evaluated prior to induction Oxygen Delivery Method: Circle system utilized Preoxygenation: Pre-oxygenation with 100% oxygen Induction Type: IV induction Ventilation: Mask ventilation without difficulty Laryngoscope Size: Mac and 4 Grade View: Grade I Tube type: Oral Tube size: 7.0 mm Number of attempts: 1 Airway Equipment and Method: Stylet Placement Confirmation: ETT inserted through vocal cords under direct vision,  positive ETCO2 and breath sounds checked- equal and bilateral Secured at: 22 cm Tube secured with: Tape Dental Injury: Teeth and Oropharynx as per pre-operative assessment

## 2020-02-07 NOTE — Anesthesia Postprocedure Evaluation (Signed)
Anesthesia Post Note  Patient: Sherry Riley  Procedure(s) Performed: SHOULDER ARTHROSCOPY WITH BANKART REPAIR (Left Shoulder)     Patient location during evaluation: PACU Anesthesia Type: General Level of consciousness: awake and alert and oriented Pain management: pain level controlled Vital Signs Assessment: post-procedure vital signs reviewed and stable Respiratory status: spontaneous breathing, nonlabored ventilation and respiratory function stable Cardiovascular status: blood pressure returned to baseline and stable Postop Assessment: no apparent nausea or vomiting Anesthetic complications: no    Last Vitals:  Vitals:   02/07/20 1315 02/07/20 1330  BP: (!) 152/95 (!) 143/90  Pulse: 75 78  Resp: 19 20  Temp:    SpO2: 97% 92%    Last Pain:  Vitals:   02/07/20 1330  TempSrc:   PainSc: Asleep                 Elnore Cosens A.

## 2020-02-08 ENCOUNTER — Ambulatory Visit (HOSPITAL_COMMUNITY): Payer: Medicare HMO

## 2020-02-08 ENCOUNTER — Encounter: Payer: Self-pay | Admitting: *Deleted

## 2020-02-08 DIAGNOSIS — C3432 Malignant neoplasm of lower lobe, left bronchus or lung: Secondary | ICD-10-CM | POA: Diagnosis not present

## 2020-02-08 DIAGNOSIS — I5031 Acute diastolic (congestive) heart failure: Secondary | ICD-10-CM | POA: Diagnosis not present

## 2020-02-08 DIAGNOSIS — E876 Hypokalemia: Secondary | ICD-10-CM | POA: Diagnosis not present

## 2020-02-08 DIAGNOSIS — R69 Illness, unspecified: Secondary | ICD-10-CM | POA: Diagnosis not present

## 2020-02-08 DIAGNOSIS — J9811 Atelectasis: Secondary | ICD-10-CM | POA: Diagnosis not present

## 2020-02-08 DIAGNOSIS — J301 Allergic rhinitis due to pollen: Secondary | ICD-10-CM | POA: Diagnosis not present

## 2020-02-08 DIAGNOSIS — Z833 Family history of diabetes mellitus: Secondary | ICD-10-CM | POA: Diagnosis not present

## 2020-02-08 DIAGNOSIS — E662 Morbid (severe) obesity with alveolar hypoventilation: Secondary | ICD-10-CM | POA: Diagnosis not present

## 2020-02-08 DIAGNOSIS — F329 Major depressive disorder, single episode, unspecified: Secondary | ICD-10-CM | POA: Diagnosis present

## 2020-02-08 DIAGNOSIS — K219 Gastro-esophageal reflux disease without esophagitis: Secondary | ICD-10-CM | POA: Diagnosis not present

## 2020-02-08 DIAGNOSIS — J4520 Mild intermittent asthma, uncomplicated: Secondary | ICD-10-CM | POA: Diagnosis not present

## 2020-02-08 DIAGNOSIS — Z20822 Contact with and (suspected) exposure to covid-19: Secondary | ICD-10-CM | POA: Diagnosis not present

## 2020-02-08 DIAGNOSIS — R0902 Hypoxemia: Secondary | ICD-10-CM | POA: Diagnosis not present

## 2020-02-08 DIAGNOSIS — I493 Ventricular premature depolarization: Secondary | ICD-10-CM | POA: Diagnosis present

## 2020-02-08 DIAGNOSIS — Z87891 Personal history of nicotine dependence: Secondary | ICD-10-CM | POA: Diagnosis not present

## 2020-02-08 DIAGNOSIS — C3492 Malignant neoplasm of unspecified part of left bronchus or lung: Secondary | ICD-10-CM | POA: Diagnosis not present

## 2020-02-08 DIAGNOSIS — M797 Fibromyalgia: Secondary | ICD-10-CM | POA: Diagnosis present

## 2020-02-08 DIAGNOSIS — Z888 Allergy status to other drugs, medicaments and biological substances status: Secondary | ICD-10-CM | POA: Diagnosis not present

## 2020-02-08 DIAGNOSIS — I110 Hypertensive heart disease with heart failure: Secondary | ICD-10-CM | POA: Diagnosis present

## 2020-02-08 DIAGNOSIS — M75102 Unspecified rotator cuff tear or rupture of left shoulder, not specified as traumatic: Secondary | ICD-10-CM | POA: Diagnosis not present

## 2020-02-08 DIAGNOSIS — Z6841 Body Mass Index (BMI) 40.0 and over, adult: Secondary | ICD-10-CM | POA: Diagnosis not present

## 2020-02-08 DIAGNOSIS — J309 Allergic rhinitis, unspecified: Secondary | ICD-10-CM | POA: Diagnosis not present

## 2020-02-08 DIAGNOSIS — E7800 Pure hypercholesterolemia, unspecified: Secondary | ICD-10-CM | POA: Diagnosis present

## 2020-02-08 DIAGNOSIS — W19XXXA Unspecified fall, initial encounter: Secondary | ICD-10-CM | POA: Diagnosis not present

## 2020-02-08 DIAGNOSIS — J95821 Acute postprocedural respiratory failure: Secondary | ICD-10-CM | POA: Diagnosis not present

## 2020-02-08 DIAGNOSIS — G4733 Obstructive sleep apnea (adult) (pediatric): Secondary | ICD-10-CM | POA: Diagnosis not present

## 2020-02-08 DIAGNOSIS — R0602 Shortness of breath: Secondary | ICD-10-CM | POA: Diagnosis not present

## 2020-02-08 DIAGNOSIS — J4521 Mild intermittent asthma with (acute) exacerbation: Secondary | ICD-10-CM | POA: Diagnosis not present

## 2020-02-08 DIAGNOSIS — R918 Other nonspecific abnormal finding of lung field: Secondary | ICD-10-CM | POA: Diagnosis not present

## 2020-02-08 DIAGNOSIS — Z8249 Family history of ischemic heart disease and other diseases of the circulatory system: Secondary | ICD-10-CM | POA: Diagnosis not present

## 2020-02-08 DIAGNOSIS — F419 Anxiety disorder, unspecified: Secondary | ICD-10-CM | POA: Diagnosis present

## 2020-02-08 DIAGNOSIS — R7303 Prediabetes: Secondary | ICD-10-CM | POA: Diagnosis not present

## 2020-02-08 DIAGNOSIS — S46012A Strain of muscle(s) and tendon(s) of the rotator cuff of left shoulder, initial encounter: Secondary | ICD-10-CM | POA: Diagnosis not present

## 2020-02-08 DIAGNOSIS — M75122 Complete rotator cuff tear or rupture of left shoulder, not specified as traumatic: Secondary | ICD-10-CM | POA: Diagnosis not present

## 2020-02-08 DIAGNOSIS — E785 Hyperlipidemia, unspecified: Secondary | ICD-10-CM | POA: Diagnosis present

## 2020-02-08 DIAGNOSIS — Z825 Family history of asthma and other chronic lower respiratory diseases: Secondary | ICD-10-CM | POA: Diagnosis not present

## 2020-02-08 DIAGNOSIS — J9601 Acute respiratory failure with hypoxia: Secondary | ICD-10-CM | POA: Diagnosis not present

## 2020-02-08 LAB — CBC WITH DIFFERENTIAL/PLATELET
Abs Immature Granulocytes: 0.09 10*3/uL — ABNORMAL HIGH (ref 0.00–0.07)
Basophils Absolute: 0 10*3/uL (ref 0.0–0.1)
Basophils Relative: 0 %
Eosinophils Absolute: 0 10*3/uL (ref 0.0–0.5)
Eosinophils Relative: 0 %
HCT: 40.2 % (ref 36.0–46.0)
Hemoglobin: 12.8 g/dL (ref 12.0–15.0)
Immature Granulocytes: 1 %
Lymphocytes Relative: 11 %
Lymphs Abs: 1.9 10*3/uL (ref 0.7–4.0)
MCH: 30.5 pg (ref 26.0–34.0)
MCHC: 31.8 g/dL (ref 30.0–36.0)
MCV: 95.9 fL (ref 80.0–100.0)
Monocytes Absolute: 1.3 10*3/uL — ABNORMAL HIGH (ref 0.1–1.0)
Monocytes Relative: 7 %
Neutro Abs: 13.9 10*3/uL — ABNORMAL HIGH (ref 1.7–7.7)
Neutrophils Relative %: 81 %
Platelets: 230 10*3/uL (ref 150–400)
RBC: 4.19 MIL/uL (ref 3.87–5.11)
RDW: 14 % (ref 11.5–15.5)
WBC: 17.2 10*3/uL — ABNORMAL HIGH (ref 4.0–10.5)
nRBC: 0 % (ref 0.0–0.2)

## 2020-02-08 LAB — BASIC METABOLIC PANEL
Anion gap: 10 (ref 5–15)
BUN: 16 mg/dL (ref 8–23)
CO2: 32 mmol/L (ref 22–32)
Calcium: 9.1 mg/dL (ref 8.9–10.3)
Chloride: 98 mmol/L (ref 98–111)
Creatinine, Ser: 0.97 mg/dL (ref 0.44–1.00)
GFR calc Af Amer: >60 mL/min (ref >60)
GFR calc non Af Amer: >60 mL/min (ref >60)
Glucose, Bld: 154 mg/dL — ABNORMAL HIGH (ref 70–99)
Potassium: 4.2 mmol/L (ref 3.5–5.1)
Sodium: 140 mmol/L (ref 135–145)

## 2020-02-08 LAB — MAGNESIUM: Magnesium: 2 mg/dL (ref 1.7–2.4)

## 2020-02-08 MED ORDER — ENOXAPARIN SODIUM 40 MG/0.4ML ~~LOC~~ SOLN
40.0000 mg | SUBCUTANEOUS | Status: DC
  Administered 2020-02-08 – 2020-02-10 (×3): 40 mg via SUBCUTANEOUS
  Filled 2020-02-08 (×3): qty 0.4

## 2020-02-08 NOTE — Evaluation (Signed)
Occupational Therapy Evaluation Patient Details Name: Sherry Riley MRN: [id number] DOB: 01-23-1951 Today's Date: 02/08/2020    History of Present Illness Sherry Riley is an 69 y.o. female who was admitted 02/07/2020 with a diagnosis of Unspecified rotator cuff tear or rupture of left shoulder. S/p shoulder arthroscopy with Bankart Repair.   Clinical Impression   PTA, pt was living at home with her sister, pt reports she was independent with ADL/IADL and modified independent with spc for functional mobility. Pt currently requires modA for positioning of LUE in sling and donning/doffing sling. Educated pt on completion of ADL with adherence to shoulder precautions. Pt verbalized her understanding and reports her sister will be able to provide this level of assistance. Pt with anticipated d/c this date. Currently recommend following surgeons recommendation for follow-up therapies. OT will sign off at this time. All education complete, pt with no additional questions.     Follow Up Recommendations  Follow surgeon's recommendation for DC plan and follow-up therapies    Equipment Recommendations  None recommended by OT    Recommendations for Other Services       Precautions / Restrictions Precautions Precautions: Fall Required Braces or Orthoses: Sling Restrictions Weight Bearing Restrictions: Yes LUE Weight Bearing: Non weight bearing      Mobility Bed Mobility               General bed mobility comments: pt sitting in recliner upon arrival;pt reports she has adjustable bed at home  Transfers Overall transfer level: Needs assistance Equipment used: 1 person hand held assist Transfers: Sit to/from Stand Sit to Stand: Min guard         General transfer comment: minguard for safety    Balance Overall balance assessment: Mild deficits observed, not formally tested                                         ADL either performed or assessed with  clinical judgement   ADL Overall ADL's : Needs assistance/impaired Eating/Feeding: Set up;Sitting   Grooming: Set up;Standing   Upper Body Bathing: Moderate assistance;Sitting   Lower Body Bathing: Moderate assistance Lower Body Bathing Details (indicate cue type and reason): to access BLE Upper Body Dressing : Moderate assistance;Sitting Upper Body Dressing Details (indicate cue type and reason): modA to don/doff sling and adjust shirt;educated pt on strategies for dressing due to limited functional use of LUE Lower Body Dressing: Moderate assistance   Toilet Transfer: Ambulation;Minimal assistance Toilet Transfer Details (indicate cue type and reason): 1 person hand held assistance, pt with minimal reliance on hand held support from therapist, pt demonstrated appropriate balance         Functional mobility during ADLs: Minimal assistance General ADL Comments: educated pt on proper wear schedule and positioning of sling;educated pt on shoulder precautions and completion of daily activities with shoulder limitations with provided handout     Vision Baseline Vision/History: Wears glasses Wears Glasses: Reading only Patient Visual Report: No change from baseline       Perception     Praxis      Pertinent Vitals/Pain Pain Assessment: 0-10 Pain Score: 4  Pain Location: L shoulder Pain Descriptors / Indicators: Sore Pain Intervention(s): Limited activity within patient's tolerance;Monitored during session     Hand Dominance Right   Extremity/Trunk Assessment Upper Extremity Assessment Upper Extremity Assessment: LUE deficits/detail LUE Deficits / Details: s/p arthroscopy with  bankart repair;pt in sling  LUE: Unable to fully assess due to immobilization LUE Sensation: WNL LUE Coordination: WNL   Lower Extremity Assessment Lower Extremity Assessment: Overall WFL for tasks assessed   Cervical / Trunk Assessment Cervical / Trunk Assessment: Normal   Communication  Communication Communication: No difficulties   Cognition Arousal/Alertness: Awake/alert Behavior During Therapy: WFL for tasks assessed/performed Overall Cognitive Status: Within Functional Limits for tasks assessed                                     General Comments  vss    Exercises Exercises: Shoulder Shoulder Exercises Wrist Flexion: AROM;5 reps;Left;Seated Wrist Extension: AROM;5 reps;Left;Seated Digit Composite Flexion: AROM;5 reps;Left;Seated Composite Extension: AROM;5 reps;Left;Seated   Shoulder Instructions Shoulder Instructions Donning/doffing shirt without moving shoulder: Moderate assistance Method for sponge bathing under operated UE: Minimal assistance Donning/doffing sling/immobilizer: Moderate assistance Correct positioning of sling/immobilizer: Moderate assistance ROM for elbow, wrist and digits of operated UE: Min-guard Sling wearing schedule (on at all times/off for ADL's): Modified independent Positioning of UE while sleeping: Minimal assistance(to position pillows)    Home Living Family/patient expects to be discharged to:: Private residence Living Arrangements: Other relatives Available Help at Discharge: Family;Available 24 hours/day Type of Home: Mobile home Home Access: Stairs to enter Entrance Stairs-Number of Steps: 3 Entrance Stairs-Rails: Can reach both Home Layout: One level     Bathroom Shower/Tub: Occupational psychologist: Standard     Home Equipment: Cane - single point;Walker - 2 wheels          Prior Functioning/Environment Level of Independence: Independent        Comments: pt reports she was independent with ADL prior to arrival;pt utilized cane for functional mobility        OT Problem List: Decreased range of motion;Decreased knowledge of precautions;Impaired UE functional use;Obesity;Pain      OT Treatment/Interventions:      OT Goals(Current goals can be found in the care plan section)  Acute Rehab OT Goals Patient Stated Goal: to go home today OT Goal Formulation: With patient Time For Goal Achievement: 02/22/20 Potential to Achieve Goals: Good  OT Frequency:     Barriers to D/C:            Co-evaluation              AM-PAC OT "6 Clicks" Daily Activity     Outcome Measure Help from another person eating meals?: A Little Help from another person taking care of personal grooming?: A Little Help from another person toileting, which includes using toliet, bedpan, or urinal?: A Little Help from another person bathing (including washing, rinsing, drying)?: A Little Help from another person to put on and taking off regular upper body clothing?: A Lot Help from another person to put on and taking off regular lower body clothing?: A Lot 6 Click Score: 16   End of Session Equipment Utilized During Treatment: Other (comment)(sling) Nurse Communication: Mobility status  Activity Tolerance: Patient tolerated treatment well Patient left: in chair;with call bell/phone within reach  OT Visit Diagnosis: Other abnormalities of gait and mobility (R26.89);Pain Pain - Right/Left: Left Pain - part of body: Shoulder                Time: 8502-7741 OT Time Calculation (min): 21 min Charges:  OT General Charges $OT Visit: 1 Visit OT Evaluation $OT Eval Moderate Complexity: 1 Mod  [facility] OTR/L Acute Rehabilitation Services Office: Edie 02/08/2020, 12:52 PM

## 2020-02-08 NOTE — Progress Notes (Signed)
EKG per order. Results, requested call for abnormal results. Read results to Dr Babs Bertin. EKG on chart.  Writer informed that Patient would be transferred to Med-Tele Unit for cardiac monitoring.

## 2020-02-08 NOTE — Consult Note (Signed)
Consult Note    PLEASE NOTE THAT DRAGON DICTATION SOFTWARE WAS USED IN THE CONSTRUCTION OF THIS NOTE.   Sherry Riley JSH:[id number] DOB: 04-26-1951 DOA: 02/07/2020  PCP: Street, Sharon [place], MD Patient coming from: home   I have personally briefly reviewed patient's old medical records in [place]  Reason for Consult: Hypoxia  HPI: Sherry Riley is a 69 y.o. female with medical history significant for mild intermittent asthma, left rotator cuff tear who was admitted to the orthopedic surgery service at [facility] on 02/07/2020 for definitive surgical management left rotator cuff tear.  Subsequently, the hospitalist service was consulted for recommendations regarding postoperative hypoxia.   On the day of admission, the patient underwent left shoulder arthroscopy with Bankart repair and associated interscalene brachial plexus block.  There is not appear to be any noted intraoperative complications.  However, postoperatively, the patient has been noted to be persistently hypoxic at rest, with oxygen saturations in the high 80s on room air, which improved into the low to mid 90s on 2 -3 L nasal cannula, and further exacerbation of patient's hypoxia with ambulation, at which time oxygen saturations decreased into the low 80s.  The patient reports that she is completely asymptomatic at the times of these desaturations, and specifically denies any associated shortness of breath, cough, pain, palpitations, diaphoresis, diaphoresis, dizziness, presyncope, syncope, peripheral edema, orthopnea, or PND.  Denies any hemoptysis, unilateral leg edema, calf tenderness, or lower extremity erythema.  She also denies any associated subjective fever, chills, rigors, or generalized myalgias. Denies any recent headache, neck stiffness, rhinitis, rhinorrhea, sore throat, nausea, vomiting, abdominal pain, diarrhea, or rash. No recent traveling or known COVID-19 exposures, preoperative nasopharyngeal  COVID-19 PCR was found to be negative when performed on 02/03/2020. Denies dysuria, gross hematuria, or change in urinary urgency/frequency.    The patient acknowledges that she is a former smoker, having smoked half a pack to 1 whole pack per day over the course of 35 to 40 years, before completely quitting smoking in 2011.  She denies any known underlying COPD, but reports has been diagnosed with asthma, with chart review and documentation confirming that the patient follows in allergy/immunology clinic for management of her asthma.  It appears that she was last seen in allergy/immunology clinic by Dr. Neldon Mc in November 2019.  Per review of documentation from that outpatient visit, the patient's asthma has been termed mild intermittent in nature, with spirometry demonstrating most recent prior FEV1 to be 70% of predicted. Dr. Bruna Potter note at that time conveyed plan for patient's asthma that included scheduled Symbicort twice daily as well as as needed albuterol. Per initial chart review, have not yet encountered documentation of previous full pulmonary function testing to evaluate for underlying COPD given significant smoking history.  The patient conveys that she uses her Symbicort on a as needed basis, and that she is not currently on any scheduled respiratory medications as an outpatient.  She also uses as needed albuterol at home, and denies any known baseline supplemental oxygen requirements.  Her past medical history is also notable for history of allergic rhinitis, for which she is on scheduled loratadine as an outpatient, but does not appear to be on intranasal steroids.   Aside from the aforementioned hypoxia, patient's vital signs have been stable postoperatively, and are notable for the following: Temperature max 98.5; heart rate 70-93; blood pressure 122/64 - 148/86.  Work-up leading up to consultation of the hospital service notable for chest x-ray performed  earlier today, which showed mild  left basilar atelectasis, but otherwise showed no evidence of acute cardiopulmonary process, including no evidence of infiltrate, edema, effusion, or pneumothorax.  Of note, preoperative EKG performed on 01/27/2020, in the absence of any previous EKG for point of comparison, showed normal sinus rhythm with heart rate 80, nonspecific Q waves in leads III and V3 and no evidence of T wave or ST changes, including no evidence of ST elevation.  Most recent labs occurred preoperatively on 01/27/2020, and were notable for mild leukocytosis.    Review of Systems: As per HPI otherwise 10 point review of systems negative.   Past Medical History:  Diagnosis Date   Asthma    Depression    Fibromyalgia    GERD (gastroesophageal reflux disease)    High blood pressure    High cholesterol    Pneumonia    Pre-diabetes     Past Surgical History:  Procedure Laterality Date   ARTHROSCOPY KNEE W/ DRILLING Right    CYST EXCISION  2018   Back cyst   HERNIA REPAIR  2015   SHOULDER ARTHROSCOPY WITH BANKART REPAIR Left 02/07/2020   Procedure: SHOULDER ARTHROSCOPY WITH BANKART REPAIR;  Surgeon: Renette Butters, MD;  Location: [facility];  Service: Orthopedics;  Laterality: Left;    Social History:  reports that she quit smoking about 10 years ago. Her smoking use included cigarettes. She quit after 36.00 years of use. She has never used smokeless tobacco. She reports that she does not drink alcohol or use drugs.   Allergies  Allergen Reactions   Atorvastatin Other (See Comments)    Myalgia   Lyrica [Pregabalin] Rash   Actonel [Risedronate Sodium]    Coreg [Carvedilol] Hives   Hydralazine Hcl Other (See Comments) and Cough    Chest pain, dyspnea   Ace Inhibitors Rash   Diltiazem Hcl Itching and Rash   Diovan Hct [Valsartan-Hydrochlorothiazide] Rash   Valsartan Rash    Family History  Problem Relation Age of Onset   Asthma Mother    Heart attack Mother    Lung cancer Father     Diabetes Brother      Prior to Admission medications   Medication Sig Start Date End Date Taking? Authorizing Provider  albuterol (PROAIR HFA) 108 (90 Base) MCG/ACT inhaler Inhale 2 puffs into the lungs every 4 (four) hours as needed for wheezing or shortness of breath. Inhale two puffs every four to six hours as needed for breathing problems.    Yes [provider]  albuterol (PROVENTIL) (2.5 MG/3ML) 0.083% nebulizer solution Take 2.5 mg by nebulization every 4 (four) hours as needed for wheezing or shortness of breath.   Yes [provider]  budesonide-formoterol (SYMBICORT) 160-4.5 MCG/ACT inhaler Inhale 1 puff into the lungs daily as needed (shortness of breath).    Yes [provider]  Calcium-Magnesium-Vitamin D (CALCIUM MAGNESIUM PO) Take 1,000 mg by mouth 4 (four) times daily.    Yes [provider]  chlorthalidone (HYGROTON) 25 MG tablet Take 25 mg by mouth daily. 10/16/17  Yes [provider]  famotidine (PEPCID) 40 MG tablet Take 40 mg by mouth daily. 09/29/19  Yes [provider]  gabapentin (NEURONTIN) 800 MG tablet Take 800 mg by mouth 4 (four) times daily.  10/16/17  Yes [provider]  ipratropium (ATROVENT) 0.02 % nebulizer solution Can use one vial in nebulizer every four to six hours as needed for cough or wheeze.  Mix with Albuterol. Patient taking differently:  Take 0.5 mg by nebulization every 2 (two) hours as needed for wheezing or shortness of breath. Can use one vial in nebulizer every four to six hours as needed for cough or wheeze.  Mix with Albuterol. 01/07/18  Yes Kozlow, Donnamarie Poag, MD  loratadine (CLARITIN) 10 MG tablet Take 10 mg by mouth daily.   Yes [provider]  LORazepam (ATIVAN) 0.5 MG tablet Take 0.5 mg by mouth 2 (two) times daily as needed (Depression).  12/25/17  Yes [provider]  meclizine (ANTIVERT) 12.5 MG tablet Take 12.5 mg by mouth 2 (two) times daily as needed for  dizziness.  10/12/17  Yes [provider]  metroNIDAZOLE (METROCREAM) 0.75 % cream Apply 1 application topically daily as needed (acne).  12/29/17  Yes [provider]  Misc Natural Products (OSTEO BI-FLEX JOINT SHIELD PO) Take 1 tablet by mouth in the morning and at bedtime.    Yes [provider]  Omega-3 Fatty Acids (FISH OIL) 1200 MG CAPS Take 1,200 mg by mouth daily.    Yes [provider]  potassium gluconate 595 (99 K) MG TABS tablet Take 595 mg by mouth daily. 09/12/19  Yes [provider]  pravastatin (PRAVACHOL) 40 MG tablet Take 40 mg by mouth daily.  12/31/17  Yes [provider]  venlafaxine (EFFEXOR) 75 MG tablet Take 75 mg by mouth daily.    Yes [provider]  Vitamin D3 (VITAMIN D) 25 MCG tablet Take 1,000 Units by mouth daily.   Yes [provider]  baclofen (LIORESAL) 10 MG tablet Take 1 tablet (10 mg total) by mouth 3 (three) times daily as needed for muscle spasms. 02/07/20   Prudencio Burly III, PA-C  celecoxib (CELEBREX) 100 MG capsule Take 1 capsule (100 mg total) by mouth 2 (two) times daily for 14 days. 02/07/20 02/21/20  Prudencio Burly III, PA-C  ondansetron (ZOFRAN) 4 MG tablet Take 1 tablet (4 mg total) by mouth every 8 (eight) hours as needed for nausea or vomiting. 02/07/20   Prudencio Burly III, PA-C  oxyCODONE (ROXICODONE) 5 MG immediate release tablet Take 1 tablet (5 mg total) by mouth every 4 (four) hours as needed for up to 7 days for breakthrough pain. 02/07/20 02/14/20  Prudencio Burly III, PA-C     Objective    Physical Exam: Vitals:   02/08/20 0901 02/08/20 1303 02/08/20 1341 02/08/20 1724  BP: (!) 160/98 (!) 148/86  (!) 166/99  Pulse: 92 92  93  Resp: 18 20  20   Temp: 98.1 F (36.7 C) 98.5 F (36.9 C)  (!) 97.3 F (36.3 C)  TempSrc: Oral Oral    SpO2: 94% (!) 87% 90% 93%  Weight:      Height:        General: appears to be stated age; alert,  oriented Skin: warm, dry, no rash Head:  AT/Kohler Eyes:  PEARL b/l, EOMI Mouth:  Oral mucosa membranes appear moist, normal dentition Neck: supple; trachea midline Heart:  RRR; did not appreciate any M/R/G Lungs: CTAB, did not appreciate any wheezes, rales, or rhonchi Abdomen: + BS; soft, ND, NT Vascular: 2+ pedal pulses b/l; 2+ radial pulses b/l Extremities: no edema in bilateral lower extremities, no muscle wasting   Labs on Admission: I have personally reviewed following labs and imaging studies  CBC: No results for input(s): WBC, NEUTROABS, HGB, HCT, MCV, PLT in the last 168 hours. Basic Metabolic Panel: No results for input(s): NA, K, CL, CO2,  GLUCOSE, BUN, CREATININE, CALCIUM, MG, PHOS in the last 168 hours. GFR: Estimated Creatinine Clearance: 71 mL/min (A) (by C-G formula based on SCr of 1.05 mg/dL (H)). Liver Function Tests: No results for input(s): AST, ALT, ALKPHOS, BILITOT, PROT, ALBUMIN in the last 168 hours. No results for input(s): LIPASE, AMYLASE in the last 168 hours. No results for input(s): AMMONIA in the last 168 hours. Coagulation Profile: No results for input(s): INR, PROTIME in the last 168 hours. Cardiac Enzymes: No results for input(s): CKTOTAL, CKMB, CKMBINDEX, TROPONINI in the last 168 hours. BNP (last 3 results) No results for input(s): PROBNP in the last 8760 hours. HbA1C: No results for input(s): HGBA1C in the last 72 hours. CBG: No results for input(s): GLUCAP in the last 168 hours. Lipid Profile: No results for input(s): CHOL, HDL, LDLCALC, TRIG, CHOLHDL, LDLDIRECT in the last 72 hours. Thyroid Function Tests: No results for input(s): TSH, T4TOTAL, FREET4, T3FREE, THYROIDAB in the last 72 hours. Anemia Panel: No results for input(s): VITAMINB12, FOLATE, FERRITIN, TIBC, IRON, RETICCTPCT in the last 72 hours. Urine analysis: No results found for: COLORURINE, APPEARANCEUR, Thomson, Crocker, GLUCOSEU, HGBUR, BILIRUBINUR, KETONESUR, PROTEINUR,  UROBILINOGEN, NITRITE, LEUKOCYTESUR  Radiological Exams on Admission: DG Chest Port 1 View  Result Date: 02/08/2020 CLINICAL DATA:  Low O2 saturation. EXAM: PORTABLE CHEST 1 VIEW COMPARISON:  Chest x-ray dated 07/19/2013 FINDINGS: There is slight atelectasis at the left lung base. The lungs are otherwise clear. Heart size and vascularity are normal. No acute bone abnormality. IMPRESSION: Slight atelectasis at the left lung base. Electronically Signed   By: Lorriane Shire M.D.   On: 02/08/2020 13:58     EKG: Independently reviewed, with result as described above.    Assessment/Plan   Sherry Riley is a 69 y.o. female with medical history significant for mild intermittent asthma, left rotator cuff tear who was admitted to the orthopedic surgery service at [facility] on 02/07/2020 for definitive surgical management left rotator cuff tear.  Subsequently, the hospitalist service was consulted for recommendations regarding postoperative hypoxia.    Principal Problem:   Unspecified rotator cuff tear or rupture of left shoulder, not specified as traumatic Active Problems:   Hypoxia   Allergic rhinitis   #) Acute hypoxic respiratory distress: Postoperative hypoxia at rest, worse with ambulation with oxygen saturations descending into the low 80s with exertion, and improving into the low to mid 90s on 2 to 3 L nasal cannula, thereby meeting criteria for acute hypoxic respiratory distress as opposed to failure given the volume of supplemental oxygen required in order to maintain adequate O2 saturations.  Given the patient's long prior smoking history as well as the apparent absence of prior formal full pulmonary function testing to evaluate for the presence of underlying COPD, the patient's prehospitalization baseline supplemental oxygen requirements are not completely clear, nor is her true underlying pulmonary pathology.  In this context, it may be that the patient is at her respiratory  baseline in the setting of previously undiagnosed COPD given a long smoking history.  Possibility appears to garner additional support from the lack of symptoms that the patient experiences when found to be acutely hypoxic.  There appears to be additional desaturation when the patient is asleep, raising the additional possibility of previously undiagnosed obstructive sleep apnea.  Chest x-ray performed earlier today, showed mild left basilar atelectasis but otherwise showed no evidence of acute cardiopulmonary process, including no evidence of infiltrate, edema, effusion, or pneumothorax.  Clinically and radiographically, no evidence of  suggest underlying acutely decompensated heart failure.  Patient denies any chest discomfort, but will pursue updated EKG to evaluate for any underlying contributory ACS, although this possibility appears less likely at this time.  Clinically, presentation is also less suggestive of PE.  Differential regarding other potential factors contributing to the patient's hypoxia include relative decline in inspiratory effort in the setting of postoperative analgesia.  Additionally, patient's medical history also includes allergic rhinitis, which, in tandem asthma suggestive of a potential component of underlying atopy, with suboptimally managed allergic rhinitis at home in the absence of first-line intranasal steroid potentially contributing to suboptimal management of underlying asthma, with further chronic suboptimal asthma management stemming from the patient's report she uses her Symbicort on a as needed basis as opposed to the recommended scheduled basis reflected in the documentation from her allergy/immunology clinic visits.  Of note, preoperative nasopharyngeal COVID-19 PCR is found to be negative on 02/03/2020, and there does not appear to be an indication for repeating this study at this time.   Recommendations: Check EKG.  Check BNP.  Consider echocardiogram, although this can  likely occur as an outpatient.  Monitor on continuous pulse oximetry.  Emphasized aggressive incentive spirometry check BMP, CBC, and serum magnesium level closely consider checking serum phosphorus level, as initial hypophosphatemia can be associated with relative diaphragmatic paralysis.  To further evaluate the patient's hypoxia particularly in the context of a long smoking history, will obtain ABG.  Patient would likely benefit from pursuing formal pulmonary function testing as an outpatient to formally evaluate for any presence of underlying COPD given her long smoking history.  Consideration can also be given to pursuing outpatient polysomnography for evaluation of obstructive sleep apnea given nocturnal exacerbation of the patient's hypoxia.  Recommend Symbicort or beta-blocker/inhaled corticosteroid equivalent on a scheduled basis, with as needed albuterol inhaler for any breakthrough shortness of breath or wheezing.  Recommend continuation scheduled loratadine, but consider addition of first-line intranasal steroids in the form of fluticasone daily to help optimize atopy constellation.      #) Mild intermittent asthma: With most recent FEV1 showing 70% of predicted.  It appears that outpatient respiratory regimen consists of as needed Symbicort as well as as needed albuterol, with most recent documentation from allergy/immunology clinic conveying recommendation for scheduled Symbicort, given the benefit of scheduled inhaled corticosteroids in the setting of asthma and prn, as described above.  No overt evidence of acute asthma exacerbation at this time.   Recommendations: Scheduled Symbicort twice daily.  As needed albuterol.  Consideration for formal outpatient pulmonary function testing to evaluate for any additional evidence of obstructive lung disease given extensive smoking history.  Check serum magnesium level with as needed supplementation for value less than 2.0.  Management of concomitant  allergic rhinitis, as further described above.     #) Allergic rhinitis: On scheduled loratadine as an outpatient, but does not appear to be on scheduled intranasal steroids, which would represent first-line intervention for allergic rhinitis.   Recommendations: Continue scheduled loratadine and add scheduled intranasal steroid in the form of daily fluticasone.    PLEASE NOTE THAT DRAGON DICTATION SOFTWARE WAS USED IN THE CONSTRUCTION OF THIS NOTE.   [facility] Pager [phone] From [place].   Otherwise, please contact night-coverage  [email or web address] Password [facility]  02/08/2020, 6:36 PM

## 2020-02-08 NOTE — Discharge Summary (Addendum)
Discharge Summary  Patient ID: Sherry Riley MRN: [id number] DOB/AGE: 01-06-1951 69 y.o.  Admit date: 02/07/2020 Discharge date: 02/13/2020  Admission Diagnoses:  Unspecified rotator cuff tear or rupture of left shoulder, not specified as traumatic  Discharge Diagnoses:  Principal Problem:   Unspecified rotator cuff tear or rupture of left shoulder, not specified as traumatic Active Problems:   Hypoxia   Allergic rhinitis   Obesity, Class III, BMI 40-49.9 (morbid obesity) ([place])   Acute respiratory failure with hypoxia (HCC)   OSA (obstructive sleep apnea)   Obesity hypoventilation syndrome (HCC)   Adenocarcinoma of left lung (HCC)   Acute diastolic CHF (congestive heart failure) ([place])   Past Medical History:  Diagnosis Date  . Asthma   . Depression   . Fibromyalgia   . GERD (gastroesophageal reflux disease)   . High blood pressure   . High cholesterol   . Pneumonia   . Pre-diabetes     Surgeries: Procedure(s): SHOULDER ARTHROSCOPY WITH BANKART REPAIR on 02/07/2020   Consultants (if any):   Discharged Condition: Improved  Hospital Course: Sherry Riley is an 69 y.o. female who was admitted 02/07/2020 with a diagnosis of Unspecified rotator cuff tear or rupture of left shoulder, not specified as traumatic and went to the operating room on 02/07/2020 and underwent the above named procedures.      She was given perioperative antibiotics:  Anti-infectives (From admission, onward)   Start     Dose/Rate Route Frequency Ordered Stop   02/07/20 0749  ceFAZolin (ANCEF) 2-4 GM/100ML-% IVPB    Note to Pharmacy: Lavon Paganini   : cabinet override      02/07/20 0749 02/07/20 1959   02/07/20 0600  ceFAZolin (ANCEF) 3 g in dextrose 5 % 50 mL IVPB     3 g 100 mL/hr over 30 Minutes Intravenous On call to O.R. 02/06/20 3244 02/07/20 1037    .  She was given sequential compression devices and early ambulation for DVT prophylaxis. She was placed in observation overnight.  In the  a.m. postop day 1 patient denies pain.  No CP or SOB.  We discussed potential impact of her acute and chronic anxiety and pain medications on her breathing and advised judicious use of same in the setting of her recent surgery.  She verbalized understanding of this as well as postoperative instructions/restrictions and that she was ready to go home.    However, She worked with OT and O2 sats when ambulating were in the 80's.  She was given her chronic pulmonary medications and nebulizer without significant improvement.  Hospital medicine team was consulted for evaluation.  Etiology initially thought to be multifactorial: Asthma, allergic rhinitis, deconditioning, and likely underlying previously undiagnosed OSA and  COPD (long-time smoker until 2011).  She had a CT scan which was abnormal and concerning for mass.  Pulmonary was consulted and biopsy has been scheduled.  Pulmonary recommendations: will need PFTs, PET scan, brain MRI as an outpatient.  -Outpatient biopsy and pulmonary follow-up.  Medicine recommendations  Scheduled inhaled corticosteroid twice daily.    As needed albuterol.    Outpatient COPD workup.    Consider outpatient polysomnography for evaluation of OSA dt exacerbation of nocturnal hypoxia.   Add daily intranasal fluticasone to loratadine for allergic rhinitis.   Incentive Spirometry    Recent vital signs:  Vitals:   02/11/20 0447 02/11/20 0848  BP: 132/78   Pulse: 70 75  Resp: 18 18  Temp: 98.1 F (36.7 C)   SpO2:  95% 96%    Recent laboratory studies:  Lab Results  Component Value Date   HGB 12.9 02/11/2020   HGB 12.6 02/10/2020   HGB 12.4 02/09/2020   Lab Results  Component Value Date   WBC 11.0 (H) 02/11/2020   PLT 247 02/11/2020   No results found for: INR Lab Results  Component Value Date   NA 140 02/11/2020   K 4.2 02/11/2020   CL 101 02/11/2020   CO2 31 02/11/2020   BUN 21 02/11/2020   CREATININE 0.99 02/11/2020   GLUCOSE 111 (H)  02/11/2020    Discharge Medications:   Allergies as of 02/11/2020      Reactions   Atorvastatin Other (See Comments)   Myalgia   Lyrica [pregabalin] Rash   Actonel [risedronate Sodium]    Coreg [carvedilol] Hives   Hydralazine Hcl Other (See Comments), Cough   Chest pain, dyspnea   Ace Inhibitors Rash   Diltiazem Hcl Itching, Rash   Diovan Hct [valsartan-hydrochlorothiazide] Rash   Valsartan Rash      Medication List    TAKE these medications   albuterol (2.5 MG/3ML) 0.083% nebulizer solution Commonly known as: PROVENTIL Take 2.5 mg by nebulization every 4 (four) hours as needed for wheezing or shortness of breath.   ProAir HFA 108 (90 Base) MCG/ACT inhaler Generic drug: albuterol Inhale 2 puffs into the lungs every 4 (four) hours as needed for wheezing or shortness of breath. Inhale two puffs every four to six hours as needed for breathing problems.   baclofen 10 MG tablet Commonly known as: LIORESAL Take 1 tablet (10 mg total) by mouth 3 (three) times daily as needed for muscle spasms. Notes to patient: 02/11/2020 as needed    budesonide-formoterol 160-4.5 MCG/ACT inhaler Commonly known as: Symbicort Inhale 1 puff into the lungs 2 (two) times daily. What changed:   when to take this  reasons to take this Notes to patient: 02/11/2020 bedtime    CALCIUM MAGNESIUM PO Take 1,000 mg by mouth 4 (four) times daily. Notes to patient: 02/11/2020    celecoxib 100 MG capsule Commonly known as: CeleBREX Take 1 capsule (100 mg total) by mouth 2 (two) times daily for 14 days. Notes to patient: 02/11/2020 bedtime    chlorthalidone 25 MG tablet Commonly known as: HYGROTON Take 25 mg by mouth daily. Notes to patient: 02/12/2020    cloNIDine 0.1 MG tablet Commonly known as: CATAPRES Take 1 tablet (0.1 mg total) by mouth 2 (two) times daily. Notes to patient: 02/11/2020    famotidine 40 MG tablet Commonly known as: PEPCID Take 40 mg by mouth daily. Notes to patient: 02/12/2020     Fish Oil 1200 MG Caps Take 1,200 mg by mouth daily. Notes to patient: 02/12/2020    fluticasone 50 MCG/ACT nasal spray Commonly known as: Flonase Place 1 spray into both nostrils daily. Notes to patient: 02/12/2020    gabapentin 800 MG tablet Commonly known as: NEURONTIN Take 800 mg by mouth 4 (four) times daily. Notes to patient: 02/11/2020 bedtime    ipratropium 0.02 % nebulizer solution Commonly known as: ATROVENT Can use one vial in nebulizer every four to six hours as needed for cough or wheeze.  Mix with Albuterol. What changed:   how much to take  how to take this  when to take this  reasons to take this   ipratropium-albuterol 0.5-2.5 (3) MG/3ML Soln Commonly known as: DUONEB Take 3 mLs by nebulization 2 (two) times daily. Notes to patient: 02/11/2020 bedtime  loratadine 10 MG tablet Commonly known as: CLARITIN Take 10 mg by mouth daily. Notes to patient: 02/12/4020    LORazepam 0.5 MG tablet Commonly known as: ATIVAN Take 0.5 mg by mouth 2 (two) times daily as needed (Depression).   meclizine 12.5 MG tablet Commonly known as: ANTIVERT Take 12.5 mg by mouth 2 (two) times daily as needed for dizziness.   metroNIDAZOLE 0.75 % cream Commonly known as: METROCREAM Apply 1 application topically daily as needed (acne).   ondansetron 4 MG tablet Commonly known as: Zofran Take 1 tablet (4 mg total) by mouth every 8 (eight) hours as needed for nausea or vomiting.   OSTEO BI-FLEX JOINT SHIELD PO Take 1 tablet by mouth in the morning and at bedtime. Notes to patient: 02/12/2020    oxyCODONE 5 MG immediate release tablet Commonly known as: Roxicodone Take 1 tablet (5 mg total) by mouth every 4 (four) hours as needed for up to 7 days for breakthrough pain.   potassium gluconate 595 (99 K) MG Tabs tablet Take 595 mg by mouth daily. Notes to patient: 02/12/2020    pravastatin 40 MG tablet Commonly known as: PRAVACHOL Take 40 mg by mouth daily. Notes to patient:  02/12/2020    venlafaxine 75 MG tablet Commonly known as: EFFEXOR Take 75 mg by mouth daily. Notes to patient: 02/12/2020    Vitamin D3 25 MCG tablet Commonly known as: Vitamin D Take 1,000 Units by mouth daily. Notes to patient: 02/12/2020        Diagnostic Studies: CT CHEST WO CONTRAST  Result Date: 02/09/2020 CLINICAL DATA:  Shortness of breath EXAM: CT CHEST WITHOUT CONTRAST TECHNIQUE: Multidetector CT imaging of the chest was performed following the standard protocol without IV contrast. COMPARISON:  Chest radiograph, 02/08/2020, 07/19/2013 FINDINGS: Cardiovascular: Aortic atherosclerosis. Aortic valve calcifications. Normal heart size. Scattered coronary artery calcifications. No pericardial effusion. Mediastinum/Nodes: No enlarged mediastinal, hilar, or axillary lymph nodes. Thyroid gland, trachea, and esophagus demonstrate no significant findings. Lungs/Pleura: Dense atelectasis or consolidation of the dependent left lower lobe and inferior lingula with associated elevation of the left hemidiaphragm. There are numerous fine centrilobular nodules of the bilateral lung apices. No pleural effusion or pneumothorax. Upper Abdomen: No acute abnormality.  Gallstones. Musculoskeletal: No chest wall mass or suspicious bone lesions identified. IMPRESSION: 1. Dense atelectasis or consolidation of the dependent left lower lobe and inferior lingula with associated elevation of the left hemidiaphragm, which may reflect chronic scarring/atelectasis or acute infectious consolidation, generally favor chronic findings, although this appearance is new compared to radiographs dated 2014. 2. Numerous fine centrilobular nodules of the bilateral lung apices, nonspecific and infectious or inflammatory, most commonly smoking-related respiratory bronchiolitis. 3.  Coronary artery disease. 4. Aortic valve calcifications. Aortic Atherosclerosis (ICD10-I70.0). 5.  Cholelithiasis. Electronically Signed   By: Eddie Candle M.D.    On: 02/09/2020 16:27   DG Chest Port 1 View  Result Date: 02/08/2020 CLINICAL DATA:  Low O2 saturation. EXAM: PORTABLE CHEST 1 VIEW COMPARISON:  Chest x-ray dated 07/19/2013 FINDINGS: There is slight atelectasis at the left lung base. The lungs are otherwise clear. Heart size and vascularity are normal. No acute bone abnormality. IMPRESSION: Slight atelectasis at the left lung base. Electronically Signed   By: Lorriane Shire M.D.   On: 02/08/2020 13:58   ECHOCARDIOGRAM COMPLETE  Result Date: 02/10/2020    ECHOCARDIOGRAM REPORT   Patient Name:   [name] Date of Exam: 02/10/2020 Medical Rec #:  [id number]       Height:  65.0 in Accession #:    [id number]      Weight:       286.1 lb Date of Birth:  02/19/1951        BSA:          2.303 m Patient Age:    73 years        BP:           145/84 mmHg Patient Gender: F               HR:           70 bpm. Exam Location:  Inpatient Procedure: 2D Echo Indications:    Acute respiratory failure with hypoxia [facility]) [[id number]]  History:        Patient has no prior history of Echocardiogram examinations.                 Risk Factors:Hypertension, Dyslipidemia, Prediabetes and Former                 Smoker. Left rotator cuff tear.  Sonographer:    Darlina Sicilian RDCS Referring Phys: [id number] [place]  Sonographer Comments: Technically challenging study due to left rotator cuff tear. IMPRESSIONS  1. Technically difficult; normal LV systolic function; mild LVE; grade 1 diastolic dysfunction; trace AI.  2. Left ventricular ejection fraction, by estimation, is 50 to 55%. The left ventricle has low normal function. The left ventricle has no regional wall motion abnormalities. The left ventricular internal cavity size was mildly dilated. Left ventricular diastolic parameters are consistent with Grade I diastolic dysfunction (impaired relaxation).  3. Right ventricular systolic function is normal. The right ventricular size is normal. Tricuspid regurgitation signal is  inadequate for assessing PA pressure.  4. The mitral valve is normal in structure. Trivial mitral valve regurgitation. No evidence of mitral stenosis.  5. The aortic valve is normal in structure. Aortic valve regurgitation is trivial. No aortic stenosis is present.  6. The inferior vena cava is dilated in size with <50% respiratory variability, suggesting right atrial pressure of 15 mmHg. FINDINGS  Left Ventricle: Left ventricular ejection fraction, by estimation, is 50 to 55%. The left ventricle has low normal function. The left ventricle has no regional wall motion abnormalities. The left ventricular internal cavity size was mildly dilated. There is no left ventricular hypertrophy. Left ventricular diastolic parameters are consistent with Grade I diastolic dysfunction (impaired relaxation). Right Ventricle: The right ventricular size is normal.Right ventricular systolic function is normal. Tricuspid regurgitation signal is inadequate for assessing PA pressure. The tricuspid regurgitant velocity is 1.60 m/s, and with an assumed right atrial pressure of 15 mmHg, the estimated right ventricular systolic pressure is 82.4 mmHg. Left Atrium: Left atrial size was normal in size. Right Atrium: Right atrial size was normal in size. Pericardium: There is no evidence of pericardial effusion. Mitral Valve: The mitral valve is normal in structure. Normal mobility of the mitral valve leaflets. Mild mitral annular calcification. Trivial mitral valve regurgitation. No evidence of mitral valve stenosis. Tricuspid Valve: The tricuspid valve is normal in structure. Tricuspid valve regurgitation is trivial. No evidence of tricuspid stenosis. Aortic Valve: The aortic valve is normal in structure. Aortic valve regurgitation is trivial. No aortic stenosis is present. Pulmonic Valve: The pulmonic valve was not well visualized. Pulmonic valve regurgitation is not visualized. No evidence of pulmonic stenosis. Aorta: The aortic root is  normal in size and structure. Venous: The inferior vena cava is dilated in size with less than 50%  respiratory variability, suggesting right atrial pressure of 15 mmHg.  Additional Comments: Technically difficult; normal LV systolic function; mild LVE; grade 1 diastolic dysfunction; trace AI.  LEFT VENTRICLE PLAX 2D LVIDd:         5.30 cm  Diastology LVIDs:         3.60 cm  LV e' lateral:   0.06 cm/s LV PW:         0.70 cm  LV E/e' lateral: 12.0 LV IVS:        0.90 cm  LV e' medial:    0.06 cm/s LVOT diam:     2.00 cm  LV E/e' medial:  12.5 LV SV:         68 LV SV Index:   30 LVOT Area:     3.14 cm  RIGHT VENTRICLE RV S prime:     9.03 cm/s TAPSE (M-mode): 1.7 cm LEFT ATRIUM             Index LA diam:        3.60 cm 1.56 cm/m LA Vol (A2C):   38.1 ml 16.54 ml/m LA Vol (A4C):   33.8 ml 14.68 ml/m LA Biplane Vol: 35.8 ml 15.54 ml/m  AORTIC VALVE LVOT Vmax:   90.80 cm/s LVOT Vmean:  60.400 cm/s LVOT VTI:    0.218 m  AORTA Ao Root diam: 2.70 cm MITRAL VALVE               TRICUSPID VALVE MV Area (PHT): 4.49 cm    TR Peak grad:   10.2 mmHg MV Decel Time: 169 msec    TR Vmax:        160.00 cm/s MV E velocity: 0.72 cm/s MV A velocity: 93.00 cm/s  SHUNTS MV E/A ratio:  0.01        Systemic VTI:  0.22 m                            Systemic Diam: 2.00 cm Kirk Ruths MD Electronically signed by Kirk Ruths MD Signature Date/Time: 02/10/2020/1:54:11 PM    Final     Disposition: Discharge disposition: 01-Home or Self Care         Follow-up Information    Renette Butters, MD.   Specialty: Orthopedic Surgery Contact information: [address] [place] [address] [phone]            Signed: Prudencio Burly III PA-C 02/13/2020, 8:45 AM

## 2020-02-08 NOTE — Progress Notes (Signed)
I was paged by patient's RN with result of EKG that had been ordered for evaluation of 1 day of acute hypoxic respiratory distress, with result including frequent PVC's in absence of any ST elevation. Will transfer to med-tele in light of finding of frequent PVC's with unknown chronicity. Mg 2.0. Potassium 4.2.   Babs Bertin, DO Hospitalist

## 2020-02-08 NOTE — Progress Notes (Signed)
Transferred to 1423 via W/C with  O2 2L Hand. Pulse Ox maintained 93-94% with no distress.  Tolerated transfer to bed Well. NT applied Tele.

## 2020-02-08 NOTE — Progress Notes (Addendum)
    Subjective: The patient was placed in observation overnight after Left rotator cuff surgery 02/07/20.  In the a.m. postop day 1 patient denies pain.  No CP or SOB.  She worked with OT and O2 sats when ambulating were in the 80's.  She was given her chronic pulmonary medications and nebulizer without significant improvement.  Hospital medicine team was consulted for evaluation.  Objective:   VITALS:   Vitals:   02/08/20 0901 02/08/20 1303 02/08/20 1341 02/08/20 1724  BP: (!) 160/98 (!) 148/86  (!) 166/99  Pulse: 92 92  93  Resp: 18 20  20   Temp: 98.1 F (36.7 C) 98.5 F (36.9 C)  (!) 97.3 F (36.3 C)  TempSrc: Oral Oral    SpO2: 94% (!) 87% 90% 93%  Weight:      Height:       CBC Latest Ref Rng & Units 01/27/2020 01/07/2018  WBC 4.0 - 10.5 K/uL 12.0(H) 10.8  Hemoglobin 12.0 - 15.0 g/dL 14.1 14.5  Hematocrit 36.0 - 46.0 % 43.3 42.2  Platelets 150 - 400 K/uL 278 267   BMP Latest Ref Rng & Units 01/27/2020  Glucose 70 - 99 mg/dL 96  BUN 8 - 23 mg/dL 16  Creatinine 0.44 - 1.00 mg/dL 1.05(H)  Sodium 135 - 145 mmol/L 135  Potassium 3.5 - 5.1 mmol/L 4.1  Chloride 98 - 111 mmol/L 94(L)  CO2 22 - 32 mmol/L 29  Calcium 8.9 - 10.3 mg/dL 9.3   Intake/Output      03/30 0701 - 03/31 0700 03/31 0701 - 04/01 0700   P.O. 780    I.V. (mL/kg) 2574.4 (19.7) 102.1 (0.8)   IV Piggyback 50    Total Intake(mL/kg) 3404.4 (26) 102.1 (0.8)   Urine (mL/kg/hr) 800 2450 (1.7)   Blood 50    Total Output 850 2450   Net +2554.4 -2347.9        Urine Occurrence 1 x 1 x      Physical Exam: General: NAD.  Upright in bed.  Calm, conversant. Resp: No increased wob Cardio: regular rate and rhythm ABD protuberant, soft Neurologically without focal deficit MSK Left arm in sling. Hand warm. Incision: dressing C/D/I  Assessment: 1 Day Post-Op  S/P Procedure(s) (LRB): SHOULDER ARTHROSCOPY WITH BANKART REPAIR (Left) by Dr. Ernesta Amble. Percell Miller on 02/07/20  Principal Problem:   Unspecified rotator  cuff tear or rupture of left shoulder, not specified as traumatic  Asthma  Postoperative Hypoxia - in the setting of chronic asthma, deconditioning, ?partial diaphragmatic hemiparesis (left) dt Interscalene brachial plexus block, and/or ?polypharmacy / sedating medications.  Plan: Evaluation by Hospital Medicine  Incentive Spirometry - CXR 02/08/20 showed slight left sided atelectasis   Weightbearing: NWB LUE Insicional and dressing care: Leave on until POD3.  See d/c instructions for further details. Orthopedic device(s): Sling Showering: Keep dressing dry VTE prophylaxis: SCDs, ambulation, lovenox. Pain control: Minimize narcotics Contact information:  Edmonia Lynch MD, Roxan Hockey PA-C  Dispo: Admit to inpatient.  Medical workup pending.   Prudencio Burly III, PA-C 02/08/2020, 5:48 PM

## 2020-02-08 NOTE — Progress Notes (Signed)
Patient was at 90% oxygen saturation on room air at rest in her room. Patient was 83% oxygen saturation on room air while ambulating in hall.

## 2020-02-08 NOTE — Plan of Care (Signed)

## 2020-02-09 ENCOUNTER — Inpatient Hospital Stay (HOSPITAL_COMMUNITY): Payer: Medicare HMO

## 2020-02-09 DIAGNOSIS — E66813 Obesity, class 3: Secondary | ICD-10-CM

## 2020-02-09 DIAGNOSIS — J309 Allergic rhinitis, unspecified: Secondary | ICD-10-CM | POA: Diagnosis present

## 2020-02-09 DIAGNOSIS — M75102 Unspecified rotator cuff tear or rupture of left shoulder, not specified as traumatic: Secondary | ICD-10-CM

## 2020-02-09 DIAGNOSIS — J4521 Mild intermittent asthma with (acute) exacerbation: Secondary | ICD-10-CM

## 2020-02-09 DIAGNOSIS — R0902 Hypoxemia: Secondary | ICD-10-CM | POA: Diagnosis present

## 2020-02-09 DIAGNOSIS — J301 Allergic rhinitis due to pollen: Secondary | ICD-10-CM

## 2020-02-09 HISTORY — DX: Allergic rhinitis, unspecified: J30.9

## 2020-02-09 HISTORY — DX: Morbid (severe) obesity due to excess calories: E66.01

## 2020-02-09 HISTORY — DX: Obesity, class 3: E66.813

## 2020-02-09 HISTORY — DX: Hypoxemia: R09.02

## 2020-02-09 LAB — CBC
HCT: 37.3 % (ref 36.0–46.0)
Hemoglobin: 12.4 g/dL (ref 12.0–15.0)
MCH: 30.9 pg (ref 26.0–34.0)
MCHC: 33.2 g/dL (ref 30.0–36.0)
MCV: 93 fL (ref 80.0–100.0)
Platelets: 207 10*3/uL (ref 150–400)
RBC: 4.01 MIL/uL (ref 3.87–5.11)
RDW: 14.2 % (ref 11.5–15.5)
WBC: 14 10*3/uL — ABNORMAL HIGH (ref 4.0–10.5)
nRBC: 0 % (ref 0.0–0.2)

## 2020-02-09 LAB — BASIC METABOLIC PANEL
Anion gap: 9 (ref 5–15)
BUN: 17 mg/dL (ref 8–23)
CO2: 31 mmol/L (ref 22–32)
Calcium: 8.9 mg/dL (ref 8.9–10.3)
Chloride: 99 mmol/L (ref 98–111)
Creatinine, Ser: 0.9 mg/dL (ref 0.44–1.00)
GFR calc Af Amer: >60 mL/min (ref >60)
GFR calc non Af Amer: >60 mL/min (ref >60)
Glucose, Bld: 117 mg/dL — ABNORMAL HIGH (ref 70–99)
Potassium: 3.5 mmol/L (ref 3.5–5.1)
Sodium: 139 mmol/L (ref 135–145)

## 2020-02-09 LAB — BRAIN NATRIURETIC PEPTIDE: B Natriuretic Peptide: 120.1 pg/mL — ABNORMAL HIGH (ref 0.0–100.0)

## 2020-02-09 LAB — BLOOD GAS, ARTERIAL
Acid-Base Excess: 9 mmol/L — ABNORMAL HIGH (ref 0.0–2.0)
Bicarbonate: 34.5 mmol/L — ABNORMAL HIGH (ref 20.0–28.0)
O2 Saturation: 94.5 %
Patient temperature: 98.1
pCO2 arterial: 51.7 mmHg — ABNORMAL HIGH (ref 32.0–48.0)
pH, Arterial: 7.439 (ref 7.350–7.450)
pO2, Arterial: 71 mmHg — ABNORMAL LOW (ref 83.0–108.0)

## 2020-02-09 LAB — MAGNESIUM: Magnesium: 2 mg/dL (ref 1.7–2.4)

## 2020-02-09 LAB — PROCALCITONIN: Procalcitonin: <0.1 ng/mL

## 2020-02-09 MED ORDER — BUDESONIDE-FORMOTEROL FUMARATE 160-4.5 MCG/ACT IN AERO: 1.0000 | INHALATION_SPRAY | Freq: Two times a day (BID) | RESPIRATORY_TRACT | 12 refills | Status: DC

## 2020-02-09 MED ORDER — FLUTICASONE PROPIONATE 50 MCG/ACT NA SUSP: 1.0000 | Freq: Every day | NASAL | 0 refills | Status: AC

## 2020-02-09 MED ORDER — IPRATROPIUM-ALBUTEROL 0.5-2.5 (3) MG/3ML IN SOLN
3.0000 mL | Freq: Three times a day (TID) | RESPIRATORY_TRACT | Status: DC
  Administered 2020-02-10: 3 mL via RESPIRATORY_TRACT
  Filled 2020-02-09: qty 3

## 2020-02-09 MED ORDER — IPRATROPIUM-ALBUTEROL 0.5-2.5 (3) MG/3ML IN SOLN
3.0000 mL | Freq: Four times a day (QID) | RESPIRATORY_TRACT | Status: DC
  Administered 2020-02-09: 3 mL via RESPIRATORY_TRACT
  Filled 2020-02-09: qty 3

## 2020-02-09 NOTE — Progress Notes (Addendum)
Hospital Course The patient was placed in observation after Left rotator cuff surgery with Interscalene brachial plexus block 02/07/20.  She worked with OT and O2 sats when ambulating were in the 80's.  She was given her chronic pulmonary medications and nebulizer without significant improvement.  CXR showed slight left atelectasis.  Hospital medicine team was consulted for evaluation of acute hypoxia.  Lab work and EKG was ordered.  Patient was transferred to med-tele overnight POD1 due to frequent PVC's without ST elevation.  The Patient has remained asymptomatic throughout her stay.  O2 sats 93-94% on 2L [place].  Subjective: Patient feels well.  Denies pain.  No CP, SOB.  Tolerating diet.  Voiding.  Reviewed hospital course and findings thus far with patient and daughter-in-law [name].  Objective:   VITALS:   Vitals:   02/08/20 1724 02/08/20 2036 02/08/20 2301 02/09/20 0450  BP: (!) 166/99 (!) 142/75 130/70 (!) 150/80  Pulse: 93 91 80 78  Resp: 20 20 20 20   Temp: (!) 97.3 F (36.3 C) 98 F (36.7 C) 98.7 F (37.1 C) 98 F (36.7 C)  TempSrc:  Oral Oral Oral  SpO2: 93% 91% 90% 94%  Weight:  129.8 kg    Height:  5\' 5"  (1.651 m)     CBC Latest Ref Rng & Units 02/09/2020 02/08/2020 01/27/2020  WBC 4.0 - 10.5 K/uL 14.0(H) 17.2(H) 12.0(H)  Hemoglobin 12.0 - 15.0 g/dL 12.4 12.8 14.1  Hematocrit 36.0 - 46.0 % 37.3 40.2 43.3  Platelets 150 - 400 K/uL 207 230 278   BMP Latest Ref Rng & Units 02/09/2020 02/08/2020 01/27/2020  Glucose 70 - 99 mg/dL 117(H) 154(H) 96  BUN 8 - 23 mg/dL 17 16 16   Creatinine 0.44 - 1.00 mg/dL 0.90 0.97 1.05(H)  Sodium 135 - 145 mmol/L 139 140 135  Potassium 3.5 - 5.1 mmol/L 3.5 4.2 4.1  Chloride 98 - 111 mmol/L 99 98 94(L)  CO2 22 - 32 mmol/L 31 32 29  Calcium 8.9 - 10.3 mg/dL 8.9 9.1 9.3   Intake/Output      03/31 0701 - 04/01 0700 04/01 0701 - 04/02 0700   P.O. 240    I.V. (mL/kg) 102.1 (0.8)    IV Piggyback     Total Intake(mL/kg) 342.1 (2.6)    Urine  (mL/kg/hr) 4550 (1.5)    Emesis/NG output 0    Stool 0    Blood     Total Output 4550    Net -4207.9         Urine Occurrence 2 x    Stool Occurrence 0 x    Emesis Occurrence 0 x       Physical Exam: General: NAD.  Upright in bed.  Calm, conversant. Resp: No increased wob Cardio: regular rate and rhythm ABD protuberant, soft Neurologically without focal deficit MSK Left arm in sling. Hand warm.  NVI distally Incision: dressing C/D/I  Assessment: 2 Days Post-Op  S/P Procedure(s) (LRB): SHOULDER ARTHROSCOPY WITH BANKART REPAIR (Left) by Dr. Ernesta Amble. Percell Miller on 02/07/20  Principal Problem:   Unspecified rotator cuff tear or rupture of left shoulder, not specified as traumatic Active Problems:   Hypoxia   Allergic rhinitis  Asthma  Acute Postoperative Hypoxic Respiratory Distress -Asymptomatic throughout hospitalization.  Etiology seems to be multifactorial:  Asthma, allergic rhinitis, deconditioning, and likely underlying previously undiagnosed OSA and COPD (long-time smoker until 2011).  Plan: Continued Evaluation / Recommendations by Hospital Medicine Acute Respiratory Distress.  Scheduled inhaled corticosteroid.  As needed albuterol.    Consider formal outpatient pulmonary function testing to evaluate for any additional evidence of obstructive lung disease given extensive smoking history.    Consider outpatient polysomnography for evaluation of OSA dt exacerbation of nocturnal hypoxia.   Add daily intranasal fluticasone to loratadine for allergic rhinitis.   Incentive Spirometry    Weightbearing: NWB LUE Insicional and dressing care: Leave on until POD3.  See d/c instructions for further details. Orthopedic device(s): Sling Showering: Keep dressing dry VTE prophylaxis: SCDs, ambulation, lovenox. Pain control: Minimize narcotics Contact information:  Edmonia Lynch MD, Roxan Hockey PA-C  Dispo: Continue inpatient.  Medical workup ongoing for  Acute Hypoxic Respiratory Distress. Continue OT while inpatient.  Stable from an orthopedic perspective.   Prudencio Burly III, PA-C 02/09/2020, 8:41 AM

## 2020-02-09 NOTE — Plan of Care (Signed)

## 2020-02-09 NOTE — Consult Note (Signed)
Medical Consultation   AMA MCMASTER  KCL:[id number]  DOB: 01/05/1951  DOA: 02/07/2020  PCP: Venetia Maxon Sharon [place], MD    Requesting physician: Dr. Roxan Hockey  Reason for consultation: Acute hypoxia   History of Present Illness: Sherry Riley is a 69 y.o. WF PMHx Depression, Mildd intermittent asthma, tobacco abuse (stopped 10 years ago; 36 years use) HLD, essential HTN, Prediabetes,   LEFT rotator cuff tear who was admitted to the orthopedic surgery service at [facility] on 02/07/2020 for definitive surgical management left rotator cuff tear.  Subsequently, the hospitalist service was consulted for recommendations regarding postoperative hypoxia.   On the day of admission, the patient underwent left shoulder arthroscopy with Bankart repair and associated interscalene brachial plexus block.  There is not appear to be any noted intraoperative complications.  However, postoperatively, the patient has been noted to be persistently hypoxic at rest, with oxygen saturations in the high 80s on room air, which improved into the low to mid 90s on 2 -3 L nasal cannula, and further exacerbation of patient's hypoxia with ambulation, at which time oxygen saturations decreased into the low 80s.  The patient reports that she is completely asymptomatic at the times of these desaturations, and specifically denies any associated shortness of breath, cough, pain, palpitations, diaphoresis, diaphoresis, dizziness, presyncope, syncope, peripheral edema, orthopnea, or PND.  Denies any hemoptysis, unilateral leg edema, calf tenderness, or lower extremity erythema.  She also denies any associated subjective fever, chills, rigors, or generalized myalgias. Denies any recent headache, neck stiffness, rhinitis, rhinorrhea, sore throat, nausea, vomiting, abdominal pain, diarrhea, or rash. No recent traveling or known COVID-19 exposures, preoperative nasopharyngeal COVID-19 PCR was found to be  negative when performed on 02/03/2020. Denies dysuria, gross hematuria, or change in urinary urgency/frequency.    The patient acknowledges that she is a former smoker, having smoked half a pack to 1 whole pack per day over the course of 35 to 40 years, before completely quitting smoking in 2011.  She denies any known underlying COPD, but reports has been diagnosed with asthma, with chart review and documentation confirming that the patient follows in allergy/immunology clinic for management of her asthma.  It appears that she was last seen in allergy/immunology clinic by Dr. Neldon Mc in November 2019.  Per review of documentation from that outpatient visit, the patient's asthma has been termed mild intermittent in nature, with spirometry demonstrating most recent prior FEV1 to be 70% of predicted. Dr. Bruna Potter note at that time conveyed plan for patient's asthma that included scheduled Symbicort twice daily as well as as needed albuterol. Per initial chart review, have not yet encountered documentation of previous full pulmonary function testing to evaluate for underlying COPD given significant smoking history.  The patient conveys that she uses her Symbicort on a as needed basis, and that she is not currently on any scheduled respiratory medications as an outpatient.  She also uses as needed albuterol at home, and denies any known baseline supplemental oxygen requirements.  Her past medical history is also notable for history of allergic rhinitis, for which she is on scheduled loratadine as an outpatient, but does not appear to be on intranasal steroids.   Aside from the aforementioned hypoxia, patient's vital signs have been stable postoperatively, and are notable for the following: Temperature max 98.5; heart rate 70-93; blood pressure 122/64 - 148/86.  Work-up leading up to consultation of the hospital  service notable for chest x-ray performed earlier today, which showed mild left basilar atelectasis, but  otherwise showed no evidence of acute cardiopulmonary process, including no evidence of infiltrate, edema, effusion, or pneumothorax.  Of note, preoperative EKG performed on 01/27/2020, in the absence of any previous EKG for point of comparison, showed normal sinus rhythm with heart rate 80, nonspecific Q waves in leads III and V3 and no evidence of T wave or ST changes, including no evidence of ST elevation.  Most recent labs occurred preoperatively on 01/27/2020, and were notable for mild leukocytosis.  -4/1 per daughter when asked this patient stop breathing response was yes patient needs a sleep study.  In addition patient has significant family history for CHF.     Review of Systems:  Review of Systems  Constitutional: Negative.   HENT: Negative.   Eyes: Negative.   Respiratory: Positive for shortness of breath and wheezing. Negative for cough, hemoptysis and sputum production.   Cardiovascular: Positive for leg swelling. Negative for chest pain, palpitations, orthopnea, claudication and PND.  Gastrointestinal: Negative.   Genitourinary: Negative.   Musculoskeletal: Positive for falls and joint pain. Negative for back pain, myalgias and neck pain.  Skin: Negative.   Neurological: Negative.   Endo/Heme/Allergies: Negative.   Psychiatric/Behavioral: Negative.      Past Medical History: Past Medical History:  Diagnosis Date  . Asthma   . Depression   . Fibromyalgia   . GERD (gastroesophageal reflux disease)   . High blood pressure   . High cholesterol   . Pneumonia   . Pre-diabetes     Past Surgical History: Past Surgical History:  Procedure Laterality Date  . ARTHROSCOPY KNEE W/ DRILLING Right   . CYST EXCISION  2018   Back cyst  . HERNIA REPAIR  2015  . SHOULDER ARTHROSCOPY WITH BANKART REPAIR Left 02/07/2020   Procedure: SHOULDER ARTHROSCOPY WITH BANKART REPAIR;  Surgeon: Renette Butters, MD;  Location: [facility];  Service: Orthopedics;  Laterality: Left;      Allergies:   Allergies  Allergen Reactions  . Atorvastatin Other (See Comments)    Myalgia  . Lyrica [Pregabalin] Rash  . Actonel [Risedronate Sodium]   . Coreg [Carvedilol] Hives  . Hydralazine Hcl Other (See Comments) and Cough    Chest pain, dyspnea  . Ace Inhibitors Rash  . Diltiazem Hcl Itching and Rash  . Diovan Hct [Valsartan-Hydrochlorothiazide] Rash  . Valsartan Rash     Social History:  reports that she quit smoking about 10 years ago. Her smoking use included cigarettes. She quit after 36.00 years of use. She has never used smokeless tobacco. She reports that she does not drink alcohol or use drugs.   Family History: Family History  Problem Relation Age of Onset  . Asthma Mother   . Heart attack Mother   . Lung cancer Father   . Diabetes Brother        Procedures/Significant Events:  3/31 PCXR;Slight atelectasis at the left lung base.    I have personally reviewed and interpreted all radiology studies and my findings are as above.  VENTILATOR SETTINGS: Nasal cannula 4/1 Flow; 2 L/min SPO2; 97%    Cultures   Antimicrobials:    Devices    LINES / TUBES:      Continuous Infusions: . lactated ringers Stopped (02/08/20 0850)  . methocarbamol (ROBAXIN) IV       Physical Exam: Vitals:   02/08/20 2301 02/09/20 0450 02/09/20 0928 02/09/20 1329  BP: 130/70 (!) 150/80  125/65 (!) 149/79  Pulse: 80 78 88 96  Resp: 20 20 18  (!) 22  Temp: 98.7 F (37.1 C) 98 F (36.7 C) 98 F (36.7 C) 98.1 F (36.7 C)  TempSrc: Oral Oral Oral Oral  SpO2: 90% 94% 93% 97%  Weight:      Height:        General: A/O x4, positive acute respiratory distress Eyes: negative scleral hemorrhage, negative anisocoria, negative icterus ENT: Negative Runny nose, negative gingival bleeding, Neck:  Negative scars, masses, torticollis, lymphadenopathy, JVD Lungs: Decreased breath sounds bilaterally positive mild wheezes, negative crackles Cardiovascular:  Regular rate and rhythm without murmur gallop or rub normal S1 and S2 Abdomen: MORBIDLY OBESE, negative abdominal pain, nondistended, positive soft, bowel sounds, no rebound, no ascites, no appreciable mass Extremities: No significant cyanosis, clubbing, or edema bilateral lower extremities Skin: Negative rashes, lesions, ulcers Psychiatric:  Negative depression, negative anxiety, negative fatigue, negative mania  Central nervous system:  Cranial nerves II through XII intact, tongue/uvula midline, all extremities muscle strength 5/5, sensation intact throughout,negative dysarthria, negative expressive aphasia, negative receptive aphasia.  Data reviewed:  I have personally reviewed following labs and imaging studies Labs:  CBC: Recent Labs  Lab 02/08/20 2017 02/09/20 0255  WBC 17.2* 14.0*  NEUTROABS 13.9*  --   HGB 12.8 12.4  HCT 40.2 37.3  MCV 95.9 93.0  PLT 230 315    Basic Metabolic Panel: Recent Labs  Lab 02/08/20 2017 02/09/20 0255  NA 140 139  K 4.2 3.5  CL 98 99  CO2 32 31  GLUCOSE 154* 117*  BUN 16 17  CREATININE 0.97 0.90  CALCIUM 9.1 8.9  MG 2.0 2.0   GFR Estimated Creatinine Clearance: 82.4 mL/min (by C-G formula based on SCr of 0.9 mg/dL). Liver Function Tests: No results for input(s): AST, ALT, ALKPHOS, BILITOT, PROT, ALBUMIN in the last 168 hours. No results for input(s): LIPASE, AMYLASE in the last 168 hours. No results for input(s): AMMONIA in the last 168 hours. Coagulation profile No results for input(s): INR, PROTIME in the last 168 hours.  Cardiac Enzymes: No results for input(s): CKTOTAL, CKMB, CKMBINDEX, TROPONINI in the last 168 hours. BNP: Invalid input(s): POCBNP CBG: No results for input(s): GLUCAP in the last 168 hours. D-Dimer No results for input(s): DDIMER in the last 72 hours. Hgb A1c No results for input(s): HGBA1C in the last 72 hours. Lipid Profile No results for input(s): CHOL, HDL, LDLCALC, TRIG, CHOLHDL, LDLDIRECT in the  last 72 hours. Thyroid function studies No results for input(s): TSH, T4TOTAL, T3FREE, THYROIDAB in the last 72 hours.  Invalid input(s): FREET3 Anemia work up No results for input(s): VITAMINB12, FOLATE, FERRITIN, TIBC, IRON, RETICCTPCT in the last 72 hours. Urinalysis No results found for: COLORURINE, APPEARANCEUR, LABSPEC, [place], GLUCOSEU, [place], [place], KETONESUR, PROTEINUR, UROBILINOGEN, NITRITE, LEUKOCYTESUR   Microbiology Recent Results (from the past 240 hour(s))  SARS CORONAVIRUS 2 (TAT 6-24 HRS) Nasopharyngeal Nasopharyngeal Swab     Status: None   Collection Time: 02/03/20  2:33 PM   Specimen: Nasopharyngeal Swab  Result Value Ref Range Status   SARS Coronavirus 2 NEGATIVE NEGATIVE Final    Comment: (NOTE) SARS-CoV-2 target nucleic acids are NOT DETECTED. The SARS-CoV-2 RNA is generally detectable in upper and lower respiratory specimens during the acute phase of infection. Negative results do not preclude SARS-CoV-2 infection, do not rule out co-infections with other pathogens, and should not be used as the sole basis for treatment or other patient management decisions. Negative results must  be combined with clinical observations, patient history, and epidemiological information. The expected result is Negative. Fact Sheet for Patients: SugarRoll.be Fact Sheet for Healthcare Providers: https://www.Kennie Snedden-mathews.com/ This test is not yet approved or cleared by the [place] FDA and  has been authorized for detection and/or diagnosis of SARS-CoV-2 by FDA under an Emergency Use Authorization (EUA). This EUA will remain  in effect (meaning this test can be used) for the duration of the COVID-19 declaration under Section 56 4(b)(1) of the Act, 21 U.S.C. section 360bbb-3(b)(1), unless the authorization is terminated or revoked sooner. Performed at [facility], [place] [address] [place], [place] [address]         Inpatient Medications:   Scheduled Meds: . docusate sodium  100 mg Oral BID  . enoxaparin (LOVENOX) injection  40 mg Subcutaneous Q24H  . famotidine  40 mg Oral Daily  . fluticasone furoate-vilanterol  1 puff Inhalation Daily  . loratadine  10 mg Oral Daily  . pravastatin  40 mg Oral Daily  . venlafaxine XR  75 mg Oral Daily   Continuous Infusions: . lactated ringers Stopped (02/08/20 0850)  . methocarbamol (ROBAXIN) IV       Radiological Exams on Admission: DG Chest Port 1 View  Result Date: 02/08/2020 CLINICAL DATA:  Low O2 saturation. EXAM: PORTABLE CHEST 1 VIEW COMPARISON:  Chest x-ray dated 07/19/2013 FINDINGS: There is slight atelectasis at the left lung base. The lungs are otherwise clear. Heart size and vascularity are normal. No acute bone abnormality. IMPRESSION: Slight atelectasis at the left lung base. Electronically Signed   By: Lorriane Shire M.D.   On: 02/08/2020 13:58    Impression/Recommendations Principal Problem:   Unspecified rotator cuff tear or rupture of left shoulder, not specified as traumatic Active Problems:   Hypoxia   Allergic rhinitis   Obesity, Class III, BMI 40-49.9 (morbid obesity) ([place])   Acute respiratory failure with hypoxia -DuoNeb QID -Flutter valve -Incentive spirometer -On PCXR patient has LLL atelectasis.  Given her significant smoking history and new onset hypoxia will obtain CT chest. -Echocardiogram pending.  Per daughter family has significant history of CHF and patient has waxing and waning periods of pedal edema. -Titrate O2 to maintain SPO2> 88%  OSA/OHS -ABG and a.m. labs consistent with respiratory acidosis fully compensated which would go along with patient with OSA/OHS. -CPAP per respiratory -Will require sleep study upon discharge.  Asthma mild intermittent per EMR -Patient did not understand how home medications will be taking.  Was taking Symbicort, and albuterol PRN -most recent FEV1 showing 70% of  predicted.  It appears that outpatient respiratory regimen consists of as needed Symbicort as well as as needed albuterol, with most recent documentation from allergy/immunology clinic conveying recommendation for scheduled Symbicort, given the benefit of scheduled inhaled corticosteroids in the setting of asthma vs prn.   COPD? -Given patient's smoking history although could not appreciate in [place] good chance CT chest will show COPD. -Titrate O2 to maintain SPO2> 88% -Flutter valve -Expiratory spirometer -DuoNeb QID -Brio Ellipta daily  LLL atelectasis -Suspicious.  Obtain chest CT to evaluate atelectasis vs mass.  Allergic rhinitis -Loratadine 10 mg daily    Thank you for this consultation.  Our [facility] hospitalist team will follow the patient with you.   Time Spent: 30 min   Billee Balcerzak J M.D. [facility] 02/09/2020, 2:29 PM  [phone]

## 2020-02-09 NOTE — Progress Notes (Signed)
SATURATION QUALIFICATIONS: (This note is used to comply with regulatory documentation for home oxygen)  Patient Saturations on Room Air at Rest = 90%  Patient Saturations on Room Air while Ambulating = 85%  Patient Saturations on 2 Liters of oxygen while Ambulating = 95%  Please briefly explain why patient needs home oxygen: Pt with increased work of breathing and decreased oxygen saturations on room air

## 2020-02-10 ENCOUNTER — Inpatient Hospital Stay (HOSPITAL_COMMUNITY): Payer: Medicare HMO

## 2020-02-10 ENCOUNTER — Telehealth: Payer: Self-pay | Admitting: Critical Care Medicine

## 2020-02-10 DIAGNOSIS — I5031 Acute diastolic (congestive) heart failure: Secondary | ICD-10-CM

## 2020-02-10 DIAGNOSIS — J9601 Acute respiratory failure with hypoxia: Secondary | ICD-10-CM

## 2020-02-10 DIAGNOSIS — I5032 Chronic diastolic (congestive) heart failure: Secondary | ICD-10-CM | POA: Diagnosis present

## 2020-02-10 DIAGNOSIS — C3492 Malignant neoplasm of unspecified part of left bronchus or lung: Secondary | ICD-10-CM | POA: Diagnosis present

## 2020-02-10 DIAGNOSIS — I10 Essential (primary) hypertension: Secondary | ICD-10-CM

## 2020-02-10 DIAGNOSIS — G4733 Obstructive sleep apnea (adult) (pediatric): Secondary | ICD-10-CM | POA: Diagnosis present

## 2020-02-10 DIAGNOSIS — E662 Morbid (severe) obesity with alveolar hypoventilation: Secondary | ICD-10-CM | POA: Diagnosis present

## 2020-02-10 DIAGNOSIS — R918 Other nonspecific abnormal finding of lung field: Secondary | ICD-10-CM

## 2020-02-10 DIAGNOSIS — M75122 Complete rotator cuff tear or rupture of left shoulder, not specified as traumatic: Secondary | ICD-10-CM

## 2020-02-10 HISTORY — DX: Acute respiratory failure with hypoxia: J96.01

## 2020-02-10 HISTORY — DX: Acute diastolic (congestive) heart failure: I50.31

## 2020-02-10 HISTORY — DX: Morbid (severe) obesity with alveolar hypoventilation: E66.2

## 2020-02-10 HISTORY — DX: Obstructive sleep apnea (adult) (pediatric): G47.33

## 2020-02-10 LAB — CBC WITH DIFFERENTIAL/PLATELET
Abs Immature Granulocytes: 0.04 10*3/uL (ref 0.00–0.07)
Basophils Absolute: 0.1 10*3/uL (ref 0.0–0.1)
Basophils Relative: 1 %
Eosinophils Absolute: 0.1 10*3/uL (ref 0.0–0.5)
Eosinophils Relative: 1 %
HCT: 39.5 % (ref 36.0–46.0)
Hemoglobin: 12.6 g/dL (ref 12.0–15.0)
Immature Granulocytes: 0 %
Lymphocytes Relative: 31 %
Lymphs Abs: 2.9 10*3/uL (ref 0.7–4.0)
MCH: 30 pg (ref 26.0–34.0)
MCHC: 31.9 g/dL (ref 30.0–36.0)
MCV: 94 fL (ref 80.0–100.0)
Monocytes Absolute: 1 10*3/uL (ref 0.1–1.0)
Monocytes Relative: 11 %
Neutro Abs: 5.3 10*3/uL (ref 1.7–7.7)
Neutrophils Relative %: 56 %
Platelets: 210 10*3/uL (ref 150–400)
RBC: 4.2 MIL/uL (ref 3.87–5.11)
RDW: 14.4 % (ref 11.5–15.5)
WBC: 9.3 10*3/uL (ref 4.0–10.5)
nRBC: 0 % (ref 0.0–0.2)

## 2020-02-10 LAB — COMPREHENSIVE METABOLIC PANEL
ALT: 16 U/L (ref 0–44)
AST: 16 U/L (ref 15–41)
Albumin: 3.3 g/dL — ABNORMAL LOW (ref 3.5–5.0)
Alkaline Phosphatase: 49 U/L (ref 38–126)
Anion gap: 9 (ref 5–15)
BUN: 21 mg/dL (ref 8–23)
CO2: 32 mmol/L (ref 22–32)
Calcium: 8.8 mg/dL — ABNORMAL LOW (ref 8.9–10.3)
Chloride: 99 mmol/L (ref 98–111)
Creatinine, Ser: 0.95 mg/dL (ref 0.44–1.00)
GFR calc Af Amer: >60 mL/min (ref >60)
GFR calc non Af Amer: >60 mL/min (ref >60)
Glucose, Bld: 107 mg/dL — ABNORMAL HIGH (ref 70–99)
Potassium: 3.4 mmol/L — ABNORMAL LOW (ref 3.5–5.1)
Sodium: 140 mmol/L (ref 135–145)
Total Bilirubin: 0.9 mg/dL (ref 0.3–1.2)
Total Protein: 6 g/dL — ABNORMAL LOW (ref 6.5–8.1)

## 2020-02-10 LAB — PROCALCITONIN: Procalcitonin: <0.1 ng/mL

## 2020-02-10 LAB — ECHOCARDIOGRAM COMPLETE
Height: 65 in
Weight: 4577.6 oz

## 2020-02-10 LAB — MAGNESIUM: Magnesium: 1.8 mg/dL (ref 1.7–2.4)

## 2020-02-10 LAB — PHOSPHORUS: Phosphorus: 4.3 mg/dL (ref 2.5–4.6)

## 2020-02-10 MED ORDER — CLONIDINE HCL 0.1 MG PO TABS
0.1000 mg | ORAL_TABLET | Freq: Two times a day (BID) | ORAL | Status: DC
  Administered 2020-02-10 – 2020-02-11 (×3): 0.1 mg via ORAL
  Filled 2020-02-10 (×3): qty 1

## 2020-02-10 MED ORDER — ALBUTEROL SULFATE (2.5 MG/3ML) 0.083% IN NEBU: 3.0000 mL | INHALATION_SOLUTION | RESPIRATORY_TRACT | Status: DC | PRN

## 2020-02-10 MED ORDER — IPRATROPIUM-ALBUTEROL 0.5-2.5 (3) MG/3ML IN SOLN
3.0000 mL | Freq: Two times a day (BID) | RESPIRATORY_TRACT | Status: DC
  Administered 2020-02-10 – 2020-02-11 (×2): 3 mL via RESPIRATORY_TRACT
  Filled 2020-02-10 (×2): qty 3

## 2020-02-10 MED ORDER — POTASSIUM CHLORIDE CRYS ER 20 MEQ PO TBCR
50.0000 meq | EXTENDED_RELEASE_TABLET | Freq: Once | ORAL | Status: AC
  Administered 2020-02-10: 50 meq via ORAL
  Filled 2020-02-10: qty 1

## 2020-02-10 NOTE — Plan of Care (Signed)

## 2020-02-10 NOTE — Telephone Encounter (Signed)
Please schedule the following:  Diagnosis: lung mass Procedure: EBUS, bronch with fluoro for transbronchial biopsy Date: 4/13  (Dr. Tamala Julian) Alternate Date: 4/12 (Dr. Carlis Abbott) Time: either AM or PM Location: [facility] Medication Restriction: none Anticoagulate/Antiplatelet: none Pre-op Labs Ordered: CBC, CMP, PT/INR, PTT Imaging request: none  Please coordinate Pre-op COVID Testing   Julian Hy, DO 02/10/20 2:20 PM [facility]

## 2020-02-10 NOTE — Progress Notes (Signed)
  Echocardiogram 2D Echocardiogram has been performed.  Darlina Sicilian M 02/10/2020, 1:20 PM

## 2020-02-10 NOTE — Care Management Important Message (Signed)
Important Message  Patient Details IM Letter given to [name] SW Case Manager to present to the Patient Name: Sherry Riley MRN: [id number] Date of Birth: [birth year]   Medicare Important Message Given:  Yes     [name] 02/10/2020, 10:05 AM

## 2020-02-10 NOTE — Consult Note (Addendum)
Medical Consultation   Sherry Riley  EHO:[id number]  DOB: [birth year]  DOA: 02/07/2020  PCP: Venetia Maxon [place], MD    Requesting physician: Dr. Roxan Hockey  Reason for consultation: Acute hypoxia   History of Present Illness: Sherry Riley is a 69 y.o. WF PMHx Depression, Mild intermittent asthma, tobacco abuse (stopped 10 years ago; 36 years use) HLD, Essential HTN, Prediabetes,   LEFT rotator cuff tear who was admitted to the orthopedic surgery service at [facility] on 02/07/2020 for definitive surgical management left rotator cuff tear.  Subsequently, the hospitalist service was consulted for recommendations regarding postoperative hypoxia.   On the day of admission, the patient underwent left shoulder arthroscopy with Bankart repair and associated interscalene brachial plexus block.  There is not appear to be any noted intraoperative complications.  However, postoperatively, the patient has been noted to be persistently hypoxic at rest, with oxygen saturations in the high 80s on room air, which improved into the low to mid 90s on 2 -3 L nasal cannula, and further exacerbation of patient's hypoxia with ambulation, at which time oxygen saturations decreased into the low 80s.  The patient reports that she is completely asymptomatic at the times of these desaturations, and specifically denies any associated shortness of breath, cough, pain, palpitations, diaphoresis, diaphoresis, dizziness, presyncope, syncope, peripheral edema, orthopnea, or PND.  Denies any hemoptysis, unilateral leg edema, calf tenderness, or lower extremity erythema.  She also denies any associated subjective fever, chills, rigors, or generalized myalgias. Denies any recent headache, neck stiffness, rhinitis, rhinorrhea, sore throat, nausea, vomiting, abdominal pain, diarrhea, or rash. No recent traveling or known COVID-19 exposures, preoperative nasopharyngeal COVID-19 PCR was found to be  negative when performed on 02/03/2020. Denies dysuria, gross hematuria, or change in urinary urgency/frequency.    The patient acknowledges that she is a former smoker, having smoked half a pack to 1 whole pack per day over the course of 35 to 40 years, before completely quitting smoking in 2011.  She denies any known underlying COPD, but reports has been diagnosed with asthma, with chart review and documentation confirming that the patient follows in allergy/immunology clinic for management of her asthma.  It appears that she was last seen in allergy/immunology clinic by Dr. Neldon Mc in November 2019.  Per review of documentation from that outpatient visit, the patient's asthma has been termed mild intermittent in nature, with spirometry demonstrating most recent prior FEV1 to be 70% of predicted. Dr. Bruna Potter note at that time conveyed plan for patient's asthma that included scheduled Symbicort twice daily as well as as needed albuterol. Per initial chart review, have not yet encountered documentation of previous full pulmonary function testing to evaluate for underlying COPD given significant smoking history.  The patient conveys that she uses her Symbicort on a as needed basis, and that she is not currently on any scheduled respiratory medications as an outpatient.  She also uses as needed albuterol at home, and denies any known baseline supplemental oxygen requirements.  Her past medical history is also notable for history of allergic rhinitis, for which she is on scheduled loratadine as an outpatient, but does not appear to be on intranasal steroids.   Aside from the aforementioned hypoxia, patient's vital signs have been stable postoperatively, and are notable for the following: Temperature max 98.5; heart rate 70-93; blood pressure 122/64 - 148/86.  Work-up leading up to consultation of the hospital  service notable for chest x-ray performed earlier today, which showed mild left basilar atelectasis, but  otherwise showed no evidence of acute cardiopulmonary process, including no evidence of infiltrate, edema, effusion, or pneumothorax.  Of note, preoperative EKG performed on 01/27/2020, in the absence of any previous EKG for point of comparison, showed normal sinus rhythm with heart rate 80, nonspecific Q waves in leads III and V3 and no evidence of T wave or ST changes, including no evidence of ST elevation.  Most recent labs occurred preoperatively on 01/27/2020, and were notable for mild leukocytosis.  -4/1 per daughter when asked this patient stop breathing response was yes patient needs a sleep study.  In addition patient has significant family history for CHF.     Review of Systems:  Review of Systems  Constitutional: Negative.   HENT: Negative.   Eyes: Negative.   Respiratory: Positive for shortness of breath and wheezing. Negative for cough, hemoptysis and sputum production.   Cardiovascular: Positive for leg swelling. Negative for chest pain, palpitations, orthopnea, claudication and PND.  Gastrointestinal: Negative.   Genitourinary: Negative.   Musculoskeletal: Positive for falls and joint pain. Negative for back pain, myalgias and neck pain.  Skin: Negative.   Neurological: Negative.   Endo/Heme/Allergies: Negative.   Psychiatric/Behavioral: Negative.      Past Medical History: Past Medical History:  Diagnosis Date  . Asthma   . Depression   . Fibromyalgia   . GERD (gastroesophageal reflux disease)   . High blood pressure   . High cholesterol   . Pneumonia   . Pre-diabetes     Past Surgical History: Past Surgical History:  Procedure Laterality Date  . ARTHROSCOPY KNEE W/ DRILLING Right   . CYST EXCISION  2018   Back cyst  . HERNIA REPAIR  2015  . SHOULDER ARTHROSCOPY WITH BANKART REPAIR Left 02/07/2020   Procedure: SHOULDER ARTHROSCOPY WITH BANKART REPAIR;  Surgeon: Renette Butters, MD;  Location: [facility];  Service: Orthopedics;  Laterality: Left;      Allergies:   Allergies  Allergen Reactions  . Atorvastatin Other (See Comments)    Myalgia  . Lyrica [Pregabalin] Rash  . Actonel [Risedronate Sodium]   . Coreg [Carvedilol] Hives  . Hydralazine Hcl Other (See Comments) and Cough    Chest pain, dyspnea  . Ace Inhibitors Rash  . Diltiazem Hcl Itching and Rash  . Diovan Hct [Valsartan-Hydrochlorothiazide] Rash  . Valsartan Rash     Social History:  reports that she quit smoking about 10 years ago. Her smoking use included cigarettes. She quit after 36.00 years of use. She has never used smokeless tobacco. She reports that she does not drink alcohol or use drugs.   Family History: Family History  Problem Relation Age of Onset  . Asthma Mother   . Heart attack Mother   . Lung cancer Father   . Diabetes Brother     Consults PCCM Dr. Ernest Mallick   Procedures/Significant Events:  3/31 PCXR;Slight atelectasis at the left lung base. 4/1 CT chest Wo contrast;-dense atelectasis or consolidation of the dependent LEFT lower lobe and inferior lingula with associated elevation of the left hemidiaphragm, which may reflect chronic scarring/atelectasis or acute infectious consolidation, generally favor chronic findings, although this appearance is new compared to radiographs dated 2014. -Numerous fine centrilobular nodules of the bilateral lung apices, nonspecific and infectious or inflammatory, most commonly smoking-related respiratory bronchiolitis. -CAD 4/2 Echocardiogram;LVEF 50 to 55%.  -Left ventricular diastolic parameters are consistent with Grade I diastolic dysfunction  I have personally reviewed and interpreted all radiology studies and my findings are as above.  VENTILATOR SETTINGS: Nasal cannula 4/2 Flow; 2 L/min SPO2; 95%    Cultures   Antimicrobials: Anti-infectives (From admission, onward)   Start     Dose/Rate Stop   02/07/20 0749  ceFAZolin (ANCEF) 2-4 GM/100ML-% IVPB    Note to Pharmacy:  Lavon Paganini   : cabinet override    02/07/20 1959   02/07/20 0600  ceFAZolin (ANCEF) 3 g in dextrose 5 % 50 mL IVPB     3 g 100 mL/hr over 30 Minutes 02/07/20 1037       Devices    LINES / TUBES:      Continuous Infusions: . lactated ringers Stopped (02/08/20 0850)  . methocarbamol (ROBAXIN) IV       Physical Exam: Vitals:   02/09/20 2254 02/10/20 0421 02/10/20 0734 02/10/20 1222  BP:  (!) 147/79  (!) 145/84  Pulse:  66  70  Resp:  16  18  Temp:  98 F (36.7 C)  97.9 F (36.6 C)  TempSrc:    Oral  SpO2: 93% 99% 95% 94%  Weight:      Height:       Physical Exam:  General: A/O x4, positive acute respiratory distress Eyes: negative scleral hemorrhage, negative anisocoria, negative icterus ENT: Negative Runny nose, negative gingival bleeding, Neck:  Negative scars, masses, torticollis, lymphadenopathy, JVD Lungs: Clear to auscultation bilaterally without wheezes or crackles Cardiovascular: Regular rate and rhythm without murmur gallop or rub normal S1 and S2 Abdomen: Morbidly OBESE, negative abdominal pain, nondistended, positive soft, bowel sounds, no rebound, no ascites, no appreciable mass Extremities: Patient with left arm in sling, swelling around shoulder appropriate for recent surgery. Skin: Negative rashes, lesions, ulcers Psychiatric:  Negative depression, negative anxiety, negative fatigue, negative mania  Central nervous system:  Cranial nerves II through XII intact, tongue/uvula midline, all extremities muscle strength 5/5, sensation intact throughout, negative dysarthria, negative expressive aphasia, negative receptive aphasia.   Data reviewed:  I have personally reviewed following labs and imaging studies Labs:  CBC: Recent Labs  Lab 02/08/20 2017 02/09/20 0255 02/10/20 0311  WBC 17.2* 14.0* 9.3  NEUTROABS 13.9*  --  5.3  HGB 12.8 12.4 12.6  HCT 40.2 37.3 39.5  MCV 95.9 93.0 94.0  PLT 230 207 099    Basic Metabolic Panel: Recent Labs   Lab 02/08/20 2017 02/08/20 2017 02/09/20 0255 02/10/20 0311  NA 140  --  139 140  K 4.2   < > 3.5 3.4*  CL 98  --  99 99  CO2 32  --  31 32  GLUCOSE 154*  --  117* 107*  BUN 16  --  17 21  CREATININE 0.97  --  0.90 0.95  CALCIUM 9.1  --  8.9 8.8*  MG 2.0  --  2.0 1.8  PHOS  --   --   --  4.3   < > = values in this interval not displayed.   GFR Estimated Creatinine Clearance: 78.1 mL/min (by C-G formula based on SCr of 0.95 mg/dL). Liver Function Tests: Recent Labs  Lab 02/10/20 0311  AST 16  ALT 16  ALKPHOS 49  BILITOT 0.9  PROT 6.0*  ALBUMIN 3.3*   No results for input(s): LIPASE, AMYLASE in the last 168 hours. No results for input(s): AMMONIA in the last 168 hours. Coagulation profile No results for input(s): INR, PROTIME in the last 168 hours.  Cardiac  Enzymes: No results for input(s): CKTOTAL, CKMB, CKMBINDEX, TROPONINI in the last 168 hours. BNP: Invalid input(s): POCBNP CBG: No results for input(s): GLUCAP in the last 168 hours. D-Dimer No results for input(s): DDIMER in the last 72 hours. Hgb A1c No results for input(s): HGBA1C in the last 72 hours. Lipid Profile No results for input(s): CHOL, HDL, LDLCALC, TRIG, CHOLHDL, LDLDIRECT in the last 72 hours. Thyroid function studies No results for input(s): TSH, T4TOTAL, T3FREE, THYROIDAB in the last 72 hours.  Invalid input(s): FREET3 Anemia work up No results for input(s): VITAMINB12, FOLATE, FERRITIN, TIBC, IRON, RETICCTPCT in the last 72 hours. Urinalysis No results found for: COLORURINE, APPEARANCEUR, LABSPEC, [place], GLUCOSEU, [place], [place], KETONESUR, PROTEINUR, UROBILINOGEN, NITRITE, LEUKOCYTESUR   Microbiology Recent Results (from the past 240 hour(s))  SARS CORONAVIRUS 2 (TAT 6-24 HRS) Nasopharyngeal Nasopharyngeal Swab     Status: None   Collection Time: 02/03/20  2:33 PM   Specimen: Nasopharyngeal Swab  Result Value Ref Range Status   SARS Coronavirus 2 NEGATIVE NEGATIVE Final     Comment: (NOTE) SARS-CoV-2 target nucleic acids are NOT DETECTED. The SARS-CoV-2 RNA is generally detectable in upper and lower respiratory specimens during the acute phase of infection. Negative results do not preclude SARS-CoV-2 infection, do not rule out co-infections with other pathogens, and should not be used as the sole basis for treatment or other patient management decisions. Negative results must be combined with clinical observations, patient history, and epidemiological information. The expected result is Negative. Fact Sheet for Patients: SugarRoll.be Fact Sheet for Healthcare Providers: https://www.Ethelean Colla-mathews.com/ This test is not yet approved or cleared by the [place] FDA and  has been authorized for detection and/or diagnosis of SARS-CoV-2 by FDA under an Emergency Use Authorization (EUA). This EUA will remain  in effect (meaning this test can be used) for the duration of the COVID-19 declaration under Section 56 4(b)(1) of the Act, 21 U.S.C. section 360bbb-3(b)(1), unless the authorization is terminated or revoked sooner. Performed at [facility], [place] [address], [place],  [address]        Inpatient Medications:   Scheduled Meds: . cloNIDine  0.1 mg Oral BID  . docusate sodium  100 mg Oral BID  . enoxaparin (LOVENOX) injection  40 mg Subcutaneous Q24H  . famotidine  40 mg Oral Daily  . fluticasone furoate-vilanterol  1 puff Inhalation Daily  . ipratropium-albuterol  3 mL Nebulization TID  . loratadine  10 mg Oral Daily  . potassium chloride  50 mEq Oral Once  . pravastatin  40 mg Oral Daily  . venlafaxine XR  75 mg Oral Daily   Continuous Infusions: . lactated ringers Stopped (02/08/20 0850)  . methocarbamol (ROBAXIN) IV       Radiological Exams on Admission: CT CHEST WO CONTRAST  Result Date: 02/09/2020 CLINICAL DATA:  Shortness of breath EXAM: CT CHEST WITHOUT CONTRAST TECHNIQUE:  Multidetector CT imaging of the chest was performed following the standard protocol without IV contrast. COMPARISON:  Chest radiograph, 02/08/2020, 07/19/2013 FINDINGS: Cardiovascular: Aortic atherosclerosis. Aortic valve calcifications. Normal heart size. Scattered coronary artery calcifications. No pericardial effusion. Mediastinum/Nodes: No enlarged mediastinal, hilar, or axillary lymph nodes. Thyroid gland, trachea, and esophagus demonstrate no significant findings. Lungs/Pleura: Dense atelectasis or consolidation of the dependent left lower lobe and inferior lingula with associated elevation of the left hemidiaphragm. There are numerous fine centrilobular nodules of the bilateral lung apices. No pleural effusion or pneumothorax. Upper Abdomen: No acute abnormality.  Gallstones. Musculoskeletal: No chest wall  mass or suspicious bone lesions identified. IMPRESSION: 1. Dense atelectasis or consolidation of the dependent left lower lobe and inferior lingula with associated elevation of the left hemidiaphragm, which may reflect chronic scarring/atelectasis or acute infectious consolidation, generally favor chronic findings, although this appearance is new compared to radiographs dated 2014. 2. Numerous fine centrilobular nodules of the bilateral lung apices, nonspecific and infectious or inflammatory, most commonly smoking-related respiratory bronchiolitis. 3.  Coronary artery disease. 4. Aortic valve calcifications. Aortic Atherosclerosis (ICD10-I70.0). 5.  Cholelithiasis. Electronically Signed   By: Eddie Candle M.D.   On: 02/09/2020 16:27   ECHOCARDIOGRAM COMPLETE  Result Date: 02/10/2020    ECHOCARDIOGRAM REPORT   Patient Name:   Sherry Riley Date of Exam: 02/10/2020 Medical Rec #:  [id number]       Height:       65.0 in Accession #:    [id number]      Weight:       286.1 lb Date of Birth:  [birth year]        BSA:          2.303 m Patient Age:    35 years        BP:           145/84 mmHg Patient Gender: F                HR:           70 bpm. Exam Location:  Inpatient Procedure: 2D Echo Indications:    Acute respiratory failure with hypoxia [facility]) [[id number]]  History:        Patient has no prior history of Echocardiogram examinations.                 Risk Factors:Hypertension, Dyslipidemia, Prediabetes and Former                 Smoker. Left rotator cuff tear.  Sonographer:    Darlina Sicilian RDCS Referring Phys: [id number] Victor  Sonographer Comments: Technically challenging study due to left rotator cuff tear. IMPRESSIONS  1. Technically difficult; normal LV systolic function; mild LVE; grade 1 diastolic dysfunction; trace AI.  2. Left ventricular ejection fraction, by estimation, is 50 to 55%. The left ventricle has low normal function. The left ventricle has no regional wall motion abnormalities. The left ventricular internal cavity size was mildly dilated. Left ventricular diastolic parameters are consistent with Grade I diastolic dysfunction (impaired relaxation).  3. Right ventricular systolic function is normal. The right ventricular size is normal. Tricuspid regurgitation signal is inadequate for assessing PA pressure.  4. The mitral valve is normal in structure. Trivial mitral valve regurgitation. No evidence of mitral stenosis.  5. The aortic valve is normal in structure. Aortic valve regurgitation is trivial. No aortic stenosis is present.  6. The inferior vena cava is dilated in size with <50% respiratory variability, suggesting right atrial pressure of 15 mmHg. FINDINGS  Left Ventricle: Left ventricular ejection fraction, by estimation, is 50 to 55%. The left ventricle has low normal function. The left ventricle has no regional wall motion abnormalities. The left ventricular internal cavity size was mildly dilated. There is no left ventricular hypertrophy. Left ventricular diastolic parameters are consistent with Grade I diastolic dysfunction (impaired relaxation). Right Ventricle: The right ventricular size  is normal.Right ventricular systolic function is normal. Tricuspid regurgitation signal is inadequate for assessing PA pressure. The tricuspid regurgitant velocity is 1.60 m/s, and with an assumed right atrial pressure of 15 mmHg, the estimated  right ventricular systolic pressure is 01.7 mmHg. Left Atrium: Left atrial size was normal in size. Right Atrium: Right atrial size was normal in size. Pericardium: There is no evidence of pericardial effusion. Mitral Valve: The mitral valve is normal in structure. Normal mobility of the mitral valve leaflets. Mild mitral annular calcification. Trivial mitral valve regurgitation. No evidence of mitral valve stenosis. Tricuspid Valve: The tricuspid valve is normal in structure. Tricuspid valve regurgitation is trivial. No evidence of tricuspid stenosis. Aortic Valve: The aortic valve is normal in structure. Aortic valve regurgitation is trivial. No aortic stenosis is present. Pulmonic Valve: The pulmonic valve was not well visualized. Pulmonic valve regurgitation is not visualized. No evidence of pulmonic stenosis. Aorta: The aortic root is normal in size and structure. Venous: The inferior vena cava is dilated in size with less than 50% respiratory variability, suggesting right atrial pressure of 15 mmHg.  Additional Comments: Technically difficult; normal LV systolic function; mild LVE; grade 1 diastolic dysfunction; trace AI.  LEFT VENTRICLE PLAX 2D LVIDd:         5.30 cm  Diastology LVIDs:         3.60 cm  LV e' lateral:   0.06 cm/s LV PW:         0.70 cm  LV E/e' lateral: 12.0 LV IVS:        0.90 cm  LV e' medial:    0.06 cm/s LVOT diam:     2.00 cm  LV E/e' medial:  12.5 LV SV:         68 LV SV Index:   30 LVOT Area:     3.14 cm  RIGHT VENTRICLE RV S prime:     9.03 cm/s TAPSE (M-mode): 1.7 cm LEFT ATRIUM             Index LA diam:        3.60 cm 1.56 cm/m LA Vol (A2C):   38.1 ml 16.54 ml/m LA Vol (A4C):   33.8 ml 14.68 ml/m LA Biplane Vol: 35.8 ml 15.54 ml/m   AORTIC VALVE LVOT Vmax:   90.80 cm/s LVOT Vmean:  60.400 cm/s LVOT VTI:    0.218 m  AORTA Ao Root diam: 2.70 cm MITRAL VALVE               TRICUSPID VALVE MV Area (PHT): 4.49 cm    TR Peak grad:   10.2 mmHg MV Decel Time: 169 msec    TR Vmax:        160.00 cm/s MV E velocity: 0.72 cm/s MV A velocity: 93.00 cm/s  SHUNTS MV E/A ratio:  0.01        Systemic VTI:  0.22 m                            Systemic Diam: 2.00 cm Kirk Ruths MD Electronically signed by Kirk Ruths MD Signature Date/Time: 02/10/2020/1:54:11 PM    Final     Impression/Recommendations Principal Problem:   Unspecified rotator cuff tear or rupture of left shoulder, not specified as traumatic Active Problems:   Hypoxia   Allergic rhinitis   Obesity, Class III, BMI 40-49.9 (morbid obesity) (HCC)   Acute respiratory failure with hypoxia (HCC)   OSA (obstructive sleep apnea)   Obesity hypoventilation syndrome (HCC)   Adenocarcinoma of left lung (HCC)   Acute diastolic CHF (congestive heart failure) (HCC)   Acute respiratory failure with hypoxia -DuoNeb QID -Flutter valve -Incentive spirometer -  On PCXR patient has LLL atelectasis.  Given her significant smoking history and new onset hypoxia will obtain CT chest. -Echocardiogram pending.  Per daughter family has significant history of CHF and patient has waxing and waning periods of pedal edema. SATURATION QUALIFICATIONS: (This note is used to comply with regulatory documentation for home oxygen) Patient Saturations on Room Air at Rest = 90% Patient Saturations on Room Air while Ambulating = 85% Patient Saturations on 2 Liters of oxygen while Ambulating = 95% Please briefly explain why patient needs home oxygen: Pt with increased work of breathing and decreased oxygen saturations on room air -Patient meets criteria for home O2 -2 L O2 via [place] titrate to maintain SPO2> 88% -Provide Inogen portable home O2 concentrator  OSA/OHS -ABG and a.m. labs consistent with  respiratory acidosis fully compensated which would go along with patient with OSA/OHS. -CPAP per respiratory -Will require sleep study upon discharge.  Patient has been evaluated by PCCM will arrange timing of spirometry, sleep study, following lung biopsy.  See atelectasis  Asthma mild intermittent per EMR -Patient did not understand how home medications will be taking.  Was taking Symbicort, and albuterol PRN -most recent FEV1 showing 70% of predicted.  It appears that outpatient respiratory regimen consists of as needed Symbicort as well as as needed albuterol, with most recent documentation from allergy/immunology clinic conveying recommendation for scheduled Symbicort, given the benefit of scheduled inhaled corticosteroids in the setting of asthma vs prn.  COPD -Given patient's smoking history although could not appreciate in [place] good chance CT chest will show COPD. -Titrate O2 to maintain SPO2> 88% -Flutter valve -Expiratory spirometer -DuoNeb QID, recommend discharging on this medication. -Albuterol PRN as rescue inhaler. -Brio Ellipta daily -Findings on chest CT NOT consistent with COPD; see results below  LLL atelectasis vs Adenocarcinoma -Suspicious.  Obtain chest CT to evaluate atelectasis vs mass. -4/2 reviewed chest CT and given patient's history and symptoms suspicious for Adenocarcinoma vs BAC.  Reviewed CT with Dr. Arby Barrette PCCM and she concurs that findings are suspicious and require biopsy. -Patient be seen by PCCM late next week for outpatient diagnostic bronchoscopy  Acute diastolic CHF -Daily weight -Strict in and out -  Essential HTN -hold patient's home chlorthalidone; secondary hypokalemia -Allergy to ACE I; rash -4/2 Clonidine 0.1 mg BID  Allergic rhinitis -Loratadine 10 mg daily  Hypokalemia -Potassium goal> 4 -K-Dur 50 mEq    Thank you for this consultation.  Our [facility] hospitalist team will follow the patient with you.   Time Spent: 30  min   Aerica Rincon J M.D. [facility] 02/10/2020, 2:45 PM  [phone]

## 2020-02-10 NOTE — Progress Notes (Signed)
SPORTS MEDICINE AND JOINT REPLACEMENT  Lara Mulch, MD    Carlyon Shadow, PA-C [address], [place], [place]  [address]                             [phone]   PROGRESS NOTE  Subjective:  negative for Chest Pain  negative for Shortness of Breath  negative for Nausea/Vomiting   negative for Calf Pain  negative for Bowel Movement   Tolerating Diet: yes         Patient reports pain as 3 on 0-10 scale.    Objective: Vital signs in last 24 hours:    Patient Vitals for the past 24 hrs:  BP Temp Temp src Pulse Resp SpO2  02/10/20 0734 - - - - - 95 %  02/10/20 0421 (!) 147/79 98 F (36.7 C) - 66 16 99 %  02/09/20 2254 - - - - - 93 %  02/09/20 1953 (!) 145/88 98 F (36.7 C) Oral 86 16 93 %  02/09/20 1650 (!) 147/84 98 F (36.7 C) Oral 89 20 97 %  02/09/20 1329 (!) 149/79 98.1 F (36.7 C) Oral 96 (!) 22 97 %  02/09/20 0928 125/65 98 F (36.7 C) Oral 88 18 93 %    @flow {1959:LAST@   Intake/Output from previous day:   04/01 0701 - 04/02 0700 In: 480 [P.O.:480] Out: 3800 [Urine:3800]   Intake/Output this shift:   No intake/output data recorded.   Intake/Output      04/01 0701 - 04/02 0700 04/02 0701 - 04/03 0700   P.O. 480    I.V. (mL/kg)     Total Intake(mL/kg) 480 (3.7)    Urine (mL/kg/hr) 3800 (1.2)    Emesis/NG output     Stool 0    Total Output 3800    Net -3320         Urine Occurrence 1 x    Stool Occurrence 1 x       LABORATORY DATA: Recent Labs    02/08/20 2017 02/09/20 0255 02/10/20 0311  WBC 17.2* 14.0* 9.3  HGB 12.8 12.4 12.6  HCT 40.2 37.3 39.5  PLT 230 207 210   Recent Labs    02/08/20 2017 02/09/20 0255 02/10/20 0311  NA 140 139 140  K 4.2 3.5 3.4*  CL 98 99 99  CO2 32 31 32  BUN 16 17 21   CREATININE 0.97 0.90 0.95  GLUCOSE 154* 117* 107*  CALCIUM 9.1 8.9 8.8*   No results found for: INR, PROTIME  Examination:  General appearance: alert, cooperative and no distress Extremities: extremities normal, atraumatic, no  cyanosis or edema  Wound Exam: clean, dry, intact   Drainage:  None: wound tissue dry  Motor Exam: Opposition, Pinch and Wrist Dorsiflexion Intact  Sensory Exam: Radial, Ulnar and Median normal   Assessment:    3 Days Post-Op  Procedure(s) (LRB): SHOULDER ARTHROSCOPY WITH BANKART REPAIR (Left)  ADDITIONAL DIAGNOSIS:  Principal Problem:   Unspecified rotator cuff tear or rupture of left shoulder, not specified as traumatic Active Problems:   Hypoxia   Allergic rhinitis   Obesity, Class III, BMI 40-49.9 (morbid obesity) (HCC)     Plan: Physical Therapy as ordered Non Weight Bearing (NWB)  DVT Prophylaxis:  Lovenox  Patient sitting up at bedside. She is doing fine from an orthopedic standpoint. Medicine following for hypoxia.  Will continue to follow until medicine gives the ok for discharge.  Donia Ast 02/10/2020, 7:59 AM

## 2020-02-10 NOTE — Consult Note (Signed)
NAME:  Sherry Riley, MRN:  [id number], DOB:  04/28/1951, LOS: 2 ADMISSION DATE:  02/07/2020, CONSULTATION DATE:  02/10/20 REFERRING MD:  Percell Miller, CHIEF COMPLAINT:  Lung mass   Brief History   Former smoker, incidentally discovered abnormal CXR & CT scan post-op. No former imaging for comparison.  History of present illness   Sherry Riley is a 69 y/o woman admitted for elective rotator cuff surgery on 3/30. She was hypoxic post-op and admitted to the hospital.  A chest x-ray was performed to evaluate her hypoxia, demonstrating possible atelectasis in the left base.  CT scan to better evaluate this demonstrated dense consolidation in the lingula and left lower lobe.  No cough, sputum production, shortness of breath.  She is wheezing of unknown chronicity.  At baseline she has no activity limitations but is not particularly active, especially since Covid.  She has been gaining weight which she attributes to being less active and spending more time at home.  No former history of cancer.  Her father had lung cancer.  She quit smoking in 2011 after 40 years x 1 pack/day.  During this hospitalization it has become apparent that she has snoring and episodes of witnessed apneas at night.  Sleep study has been recommended.  With her smoking history and wheezing on exam throughout her stay there is been concern for COPD.  She has never had PFTs in the past.  Past Medical History  Obesity Prediabetes Hyperlipidemia Hypertension GERD Fibromyalgia  Significant Hospital Events   3/30 shoulder arthroscopy with Bankart repair  Consults:  Ortho Internal Medicine Pulm  Procedures:  3/30 shoulder arthroplasty with Bankart repair  Significant Diagnostic Tests:  CT chest 4/1-dense left lingular consolidation overlying left hemidiaphragm, likely atelectasis.  Left lower lobe dense consolidation with air bronchograms sparing a central area with more normal lung tissue.  Mild airway thickening.  1 small  pretracheal lymph node, otherwise no significant adenopathy.  No pleural effusions.  Micro Data:    Antimicrobials:    Interim history/subjective:    Objective   Blood pressure (!) 145/84, pulse 70, temperature 97.9 F (36.6 C), temperature source Oral, resp. rate 18, height 5\' 5"  (1.651 m), weight 129.8 kg, SpO2 94 %.        Intake/Output Summary (Last 24 hours) at 02/10/2020 1439 Last data filed at 02/10/2020 1146 Gross per 24 hour  Intake 480 ml  Output 3900 ml  Net -3420 ml   Filed Weights   02/07/20 0808 02/08/20 2036  Weight: 130.8 kg 129.8 kg    Examination: General: Elderly woman lying in bed no acute distress HENT: [place]/AT, eyes anicteric Lungs: Clear to auscultation anteriorly.  Breathing comfortably on supplemental oxygen.  No accessory muscle use. Cardiovascular: Regular rate and rhythm Abdomen: Obese, soft, nontender, nondistended Extremities: No clubbing, cyanosis, or edema. Left upper extremity with surgical bandages in a sling. Neuro: Awake and alert, answering questions appropriately, moving all extremities.   Derm: no rashes or wounds  Resolved Hospital Problem list     Assessment & Plan:  Abnormal CT with LLL lung consolidation concerning for LLL lung mass. No corresponding RLL abnormalities to suggest atelectasis, and with a central area of normal tissue, atelectasis seems less likely.  -Reviewed CT scan with the patient and her son at bedside.  Recommend diagnostic bronchoscopy & EBUS, which would be performed as an outpatient in the next 1 to 2 weeks.  We will set this up at the beginning of next week.  I discussed the  risks, benefits, alternatives, but I feel that biopsy rather than performing follow-up imaging would be in her best interest. -We will need PFTs, PET scan, brain MRI as an outpatient. -We will arrange outpatient pulmonary follow-up.  Concern for OSA -Needs outpatient sleep medicine follow-up.  Chronic hypoxic respiratory failure,  likely COPD -Needs outpatient PFTs -Will be discharged home with oxygen per [facility] management  Best practice:    Labs   CBC: Recent Labs  Lab 02/08/20 2017 02/09/20 0255 02/10/20 0311  WBC 17.2* 14.0* 9.3  NEUTROABS 13.9*  --  5.3  HGB 12.8 12.4 12.6  HCT 40.2 37.3 39.5  MCV 95.9 93.0 94.0  PLT 230 207 127    Basic Metabolic Panel: Recent Labs  Lab 02/08/20 2017 02/09/20 0255 02/10/20 0311  NA 140 139 140  K 4.2 3.5 3.4*  CL 98 99 99  CO2 32 31 32  GLUCOSE 154* 117* 107*  BUN 16 17 21   CREATININE 0.97 0.90 0.95  CALCIUM 9.1 8.9 8.8*  MG 2.0 2.0 1.8  PHOS  --   --  4.3   GFR: Estimated Creatinine Clearance: 78.1 mL/min (by C-G formula based on SCr of 0.95 mg/dL). Recent Labs  Lab 02/08/20 2017 02/09/20 0255 02/09/20 2132 02/10/20 0311  PROCALCITON  --   --  <0.10 <0.10  WBC 17.2* 14.0*  --  9.3    Liver Function Tests: Recent Labs  Lab 02/10/20 0311  AST 16  ALT 16  ALKPHOS 49  BILITOT 0.9  PROT 6.0*  ALBUMIN 3.3*   No results for input(s): LIPASE, AMYLASE in the last 168 hours. No results for input(s): AMMONIA in the last 168 hours.  ABG    Component Value Date/Time   PHART 7.439 02/09/2020 0258   PCO2ART 51.7 (H) 02/09/2020 0258   PO2ART 71.0 (L) 02/09/2020 0258   HCO3 34.5 (H) 02/09/2020 0258   O2SAT 94.5 02/09/2020 0258     Coagulation Profile: No results for input(s): INR, PROTIME in the last 168 hours.  Cardiac Enzymes: No results for input(s): CKTOTAL, CKMB, CKMBINDEX, TROPONINI in the last 168 hours.  HbA1C: No results found for: HGBA1C  CBG: No results for input(s): GLUCAP in the last 168 hours.  Review of Systems:   No weight loss, cough, sputum, hemoptysis, dizziness, new weakness.  Past Medical History  She,  has a past medical history of Asthma, Depression, Fibromyalgia, GERD (gastroesophageal reflux disease), High blood pressure, High cholesterol, Pneumonia, and Pre-diabetes.   Surgical History    Past Surgical  History:  Procedure Laterality Date  . ARTHROSCOPY KNEE W/ DRILLING Right   . CYST EXCISION  2018   Back cyst  . HERNIA REPAIR  2015  . SHOULDER ARTHROSCOPY WITH BANKART REPAIR Left 02/07/2020   Procedure: SHOULDER ARTHROSCOPY WITH BANKART REPAIR;  Surgeon: Renette Butters, MD;  Location: [facility];  Service: Orthopedics;  Laterality: Left;     Social History   reports that she quit smoking about 10 years ago. Her smoking use included cigarettes. She quit after 36.00 years of use. She has never used smokeless tobacco. She reports that she does not drink alcohol or use drugs.   Family History   Her family history includes Asthma in her mother; Diabetes in her brother; Heart attack in her mother; Lung cancer in her father.   Allergies Allergies  Allergen Reactions  . Atorvastatin Other (See Comments)    Myalgia  . Lyrica [Pregabalin] Rash  . Actonel [Risedronate Sodium]   . Coreg [  Carvedilol] Hives  . Hydralazine Hcl Other (See Comments) and Cough    Chest pain, dyspnea  . Ace Inhibitors Rash  . Diltiazem Hcl Itching and Rash  . Diovan Hct [Valsartan-Hydrochlorothiazide] Rash  . Valsartan Rash     Home Medications  Prior to Admission medications   Medication Sig Start Date End Date Taking? Authorizing Provider  albuterol (PROAIR HFA) 108 (90 Base) MCG/ACT inhaler Inhale 2 puffs into the lungs every 4 (four) hours as needed for wheezing or shortness of breath. Inhale two puffs every four to six hours as needed for breathing problems.    Yes [provider]  albuterol (PROVENTIL) (2.5 MG/3ML) 0.083% nebulizer solution Take 2.5 mg by nebulization every 4 (four) hours as needed for wheezing or shortness of breath.   Yes [provider]  Calcium-Magnesium-Vitamin D (CALCIUM MAGNESIUM PO) Take 1,000 mg by mouth 4 (four) times daily.    Yes [provider]  chlorthalidone (HYGROTON) 25 MG tablet Take 25 mg by mouth daily. 10/16/17  Yes [provider]    famotidine (PEPCID) 40 MG tablet Take 40 mg by mouth daily. 09/29/19  Yes [provider]  gabapentin (NEURONTIN) 800 MG tablet Take 800 mg by mouth 4 (four) times daily.  10/16/17  Yes [provider]  ipratropium (ATROVENT) 0.02 % nebulizer solution Can use one vial in nebulizer every four to six hours as needed for cough or wheeze.  Mix with Albuterol. Patient taking differently: Take 0.5 mg by nebulization every 2 (two) hours as needed for wheezing or shortness of breath. Can use one vial in nebulizer every four to six hours as needed for cough or wheeze.  Mix with Albuterol. 01/07/18  Yes Kozlow, Donnamarie Poag, MD  loratadine (CLARITIN) 10 MG tablet Take 10 mg by mouth daily.   Yes [provider]  LORazepam (ATIVAN) 0.5 MG tablet Take 0.5 mg by mouth 2 (two) times daily as needed (Depression).  12/25/17  Yes [provider]  meclizine (ANTIVERT) 12.5 MG tablet Take 12.5 mg by mouth 2 (two) times daily as needed for dizziness.  10/12/17  Yes [provider]  metroNIDAZOLE (METROCREAM) 0.75 % cream Apply 1 application topically daily as needed (acne).  12/29/17  Yes [provider]  Misc Natural Products (OSTEO BI-FLEX JOINT SHIELD PO) Take 1 tablet by mouth in the morning and at bedtime.    Yes [provider]  Omega-3 Fatty Acids (FISH OIL) 1200 MG CAPS Take 1,200 mg by mouth daily.    Yes [provider]  potassium gluconate 595 (99 K) MG TABS tablet Take 595 mg by mouth daily. 09/12/19  Yes [provider]  pravastatin (PRAVACHOL) 40 MG tablet Take 40 mg by mouth daily.  12/31/17  Yes [provider]  venlafaxine (EFFEXOR) 75 MG tablet Take 75 mg by mouth daily.    Yes [provider]  Vitamin D3 (VITAMIN D) 25 MCG tablet Take 1,000 Units by mouth daily.   Yes [provider]  baclofen (LIORESAL) 10 MG tablet Take 1 tablet (10 mg total) by mouth 3 (three) times daily as needed for muscle spasms.  02/07/20   Martensen, Charna Elizabeth III, PA-C  budesonide-formoterol (SYMBICORT) 160-4.5 MCG/ACT inhaler Inhale 1 puff into the lungs 2 (two) times daily. 02/09/20   Prudencio Burly III, PA-C  celecoxib (CELEBREX) 100 MG capsule Take 1 capsule (100 mg total) by mouth 2 (two) times daily for 14 days. 02/07/20 02/21/20  Prudencio Burly III, PA-C  fluticasone (FLONASE) 50 MCG/ACT nasal spray Place 1 spray into both nostrils daily. 02/09/20 03/10/20  Prudencio Burly III, PA-C  ondansetron (ZOFRAN) 4 MG tablet Take 1 tablet (4 mg total) by mouth every 8 (eight) hours as needed for nausea or vomiting. 02/07/20   Prudencio Burly III, PA-C  oxyCODONE (ROXICODONE) 5 MG immediate release tablet Take 1 tablet (5 mg total) by mouth every 4 (four) hours as needed for up to 7 days for breakthrough pain. 02/07/20 02/14/20  Prudencio Burly III, PA-C    Julian Hy, DO 02/10/20 2:40 PM [facility]

## 2020-02-11 LAB — COMPREHENSIVE METABOLIC PANEL
ALT: 17 U/L (ref 0–44)
AST: 15 U/L (ref 15–41)
Albumin: 3.4 g/dL — ABNORMAL LOW (ref 3.5–5.0)
Alkaline Phosphatase: 55 U/L (ref 38–126)
Anion gap: 8 (ref 5–15)
BUN: 21 mg/dL (ref 8–23)
CO2: 31 mmol/L (ref 22–32)
Calcium: 8.9 mg/dL (ref 8.9–10.3)
Chloride: 101 mmol/L (ref 98–111)
Creatinine, Ser: 0.99 mg/dL (ref 0.44–1.00)
GFR calc Af Amer: >60 mL/min (ref >60)
GFR calc non Af Amer: 59 mL/min — ABNORMAL LOW (ref >60)
Glucose, Bld: 111 mg/dL — ABNORMAL HIGH (ref 70–99)
Potassium: 4.2 mmol/L (ref 3.5–5.1)
Sodium: 140 mmol/L (ref 135–145)
Total Bilirubin: 0.8 mg/dL (ref 0.3–1.2)
Total Protein: 6.1 g/dL — ABNORMAL LOW (ref 6.5–8.1)

## 2020-02-11 LAB — PHOSPHORUS: Phosphorus: 4.5 mg/dL (ref 2.5–4.6)

## 2020-02-11 LAB — CBC WITH DIFFERENTIAL/PLATELET
Abs Immature Granulocytes: 0.04 10*3/uL (ref 0.00–0.07)
Basophils Absolute: 0.1 10*3/uL (ref 0.0–0.1)
Basophils Relative: 1 %
Eosinophils Absolute: 0.2 10*3/uL (ref 0.0–0.5)
Eosinophils Relative: 2 %
HCT: 40.2 % (ref 36.0–46.0)
Hemoglobin: 12.9 g/dL (ref 12.0–15.0)
Immature Granulocytes: 0 %
Lymphocytes Relative: 33 %
Lymphs Abs: 3.7 10*3/uL (ref 0.7–4.0)
MCH: 30.5 pg (ref 26.0–34.0)
MCHC: 32.1 g/dL (ref 30.0–36.0)
MCV: 95 fL (ref 80.0–100.0)
Monocytes Absolute: 0.9 10*3/uL (ref 0.1–1.0)
Monocytes Relative: 8 %
Neutro Abs: 6.2 10*3/uL (ref 1.7–7.7)
Neutrophils Relative %: 56 %
Platelets: 247 10*3/uL (ref 150–400)
RBC: 4.23 MIL/uL (ref 3.87–5.11)
RDW: 14.3 % (ref 11.5–15.5)
WBC: 11 10*3/uL — ABNORMAL HIGH (ref 4.0–10.5)
nRBC: 0 % (ref 0.0–0.2)

## 2020-02-11 LAB — PROCALCITONIN: Procalcitonin: <0.1 ng/mL

## 2020-02-11 LAB — MAGNESIUM: Magnesium: 2 mg/dL (ref 1.7–2.4)

## 2020-02-11 MED ORDER — IPRATROPIUM-ALBUTEROL 0.5-2.5 (3) MG/3ML IN SOLN: 3.0000 mL | Freq: Two times a day (BID) | RESPIRATORY_TRACT | 0 refills | Status: DC

## 2020-02-11 MED ORDER — CLONIDINE HCL 0.1 MG PO TABS: 0.1000 mg | ORAL_TABLET | Freq: Two times a day (BID) | ORAL | 0 refills | Status: AC

## 2020-02-11 NOTE — Progress Notes (Signed)
[facility] Service (OTS)  4 Days Post-Op Procedure(s) (LRB): SHOULDER ARTHROSCOPY WITH BANKART REPAIR (Left)  Subjective: Patient reports pain as mild.  Hopeful to go home today.   Objective: Current Vitals Blood pressure 132/78, pulse 75, temperature 98.1 F (36.7 C), temperature source Oral, resp. rate 18, height 5\' 5"  (1.651 m), weight 131.1 kg, SpO2 96 %. Vital signs in last 24 hours: Temp:  [97.9 F (36.6 C)-98.1 F (36.7 C)] 98.1 F (36.7 C) (04/03 0447) Pulse Rate:  [70-80] 75 (04/03 0848) Resp:  [18] 18 (04/03 0848) BP: (132-153)/(78-84) 132/78 (04/03 0447) SpO2:  [94 %-97 %] 96 % (04/03 0848) Weight:  [128.3 kg-131.1 kg] 131.1 kg (04/03 0500)  Intake/Output from previous day: 04/02 0701 - 04/03 0700 In: 840 [P.O.:840] Out: 3000 [Urine:3000]  LABS Recent Labs    02/08/20 2017 02/09/20 0255 02/10/20 0311 02/11/20 0326  HGB 12.8 12.4 12.6 12.9   Recent Labs    02/10/20 0311 02/11/20 0326  WBC 9.3 11.0*  RBC 4.20 4.23  HCT 39.5 40.2  PLT 210 247   Recent Labs    02/10/20 0311 02/11/20 0326  NA 140 140  K 3.4* 4.2  CL 99 101  CO2 32 31  BUN 21 21  CREATININE 0.95 0.99  GLUCOSE 107* 111*  CALCIUM 8.8* 8.9   No results for input(s): LABPT, INR in the last 72 hours.   Physical Exam On O2, daughter at bedside LUE   Sling in place, drsg C/D/I  Sens  Ax/R/M/U intact  Mot   Ax/ R/ PIN/ M/ AIN/ U intact  Brisk CR  Assessment/Plan: 4 Days Post-Op Procedure(s) (LRB): SHOULDER ARTHROSCOPY WITH BANKART REPAIR (Left) 1. OT  2. DVT proph Other (comment) Not indicated for upper extrem surg; lung bx scheduled 4/13 3. Possible d/c today if able to maintain sats with ambulation 4. F/u 8-14 days with Dr. Percell Miller  Really appreciate Dr. Sherral Hammers of Medical Service and his thorough evaluation and management, which has been outstanding with Bx scheduled, home oxygen, med changes.  [place], MD [facility],  PC [phone] (p)

## 2020-02-11 NOTE — Plan of Care (Signed)
Discharge instructions reviewed with patient and daughter, questions answered, verbalized understanding.  Patient transported via wheelchair to main entrance to be taken home by her daughter.

## 2020-02-11 NOTE — TOC Progression Note (Signed)
Transition of Care [facility]) - Progression Note    Patient Details  Name: Sherry Riley MRN: [id number] Date of Birth: 1950/12/11  [facility]) CM/SW Contact  Joaquin Courts, RN Phone Number: 02/11/2020, 11:34 AM  Clinical Narrative:   CM noted desaturation screen results. Patient does not qualify for home O2.          Expected Discharge Plan and Services           Expected Discharge Date: 02/11/20                                     Social Determinants of Health (SDOH) Interventions    Readmission Risk Interventions No flowsheet data found.

## 2020-02-11 NOTE — Progress Notes (Signed)
PROGRESS NOTE    Sherry Riley  ZOX:[id number] DOB: 03/28/1951 DOA: 02/07/2020 PCP: Venetia Maxon, Sharon Mt, MD  Outpatient Specialists:   Brief Narrative: Patient is a 69 year old female, morbidly obese, with past medical history significant for mild intermittent asthma, reformed tobacco use user (quit 10 years ago, after 36 years refused), hypertension, prediabetes mellitus and depression.  Patient was admitted by the orthopedic team and underwent surgical repair of left rotator cuff tear.  Hospitalist team was consulted to assist with hypoxia.  Initial plan was to discharge patient on supplemental home oxygen.  However, evaluation for home oxygen requirement on earlier today indicated that patient will not need any home oxygen.  Patient was seen alongside patient's daughter and nurse.  Patient and patient's daughter understand that they can contact the primary care provider or the pulmonologist if patient develops shortness of breath.  Orthopedic team plans to discharge patient home today.   Assessment & Plan:   Principal Problem:   Unspecified rotator cuff tear or rupture of left shoulder, not specified as traumatic Active Problems:   Hypoxia   Allergic rhinitis   Obesity, Class III, BMI 40-49.9 (morbid obesity) (HCC)   Acute respiratory failure with hypoxia (HCC)   OSA (obstructive sleep apnea)   Obesity hypoventilation syndrome (HCC)   Adenocarcinoma of left lung (HCC)   Acute diastolic CHF (congestive heart failure) (HCC)   Acute respiratory failure with hypoxia following surgery: Resolved. Assessment done today indicates patient would not qualify for home oxygen. Follow-up with primary care provider and pulmonary team on discharge.  Morbid obesity: Diet and exercise on discharge. Consider assessment for probably undiagnosed OSA/OHS.  Left lower lobe atelectasis versus adenocarcinoma: Follow-up with pulmonary team on discharge. Lung lesion will need further work-up.  Mild  asthma: Stable.  Diastolic CHF: Stable.   Subjective: No complaints. No shortness of breath No chest pain.  Objective: Vitals:   02/10/20 2154 02/11/20 0447 02/11/20 0500 02/11/20 0848  BP: (!) 153/80 132/78    Pulse: 80 70  75  Resp:  18  18  Temp: 98.1 F (36.7 C) 98.1 F (36.7 C)    TempSrc: Oral Oral    SpO2: 97% 95%  96%  Weight:   131.1 kg   Height:        Intake/Output Summary (Last 24 hours) at 02/11/2020 1150 Last data filed at 02/11/2020 0954 Gross per 24 hour  Intake 480 ml  Output 1900 ml  Net -1420 ml   Filed Weights   02/08/20 2036 02/10/20 1446 02/11/20 0500  Weight: 129.8 kg 128.3 kg 131.1 kg    Examination:  General exam: Appears calm and comfortable.  Patient is morbidly obese.  Left total extremity is in a sling. Respiratory system: Clear to auscultation. Respiratory effort normal. Cardiovascular system: S1 & S2 heard. No pedal edema. Gastrointestinal system: Abdomen is morbidly obese, soft and nontender.  Organs are difficult to assess.   Central nervous system: Alert and oriented.  Patient moves all extremities.   Extremities: No leg edema.  Data Reviewed: I have personally reviewed following labs and imaging studies  CBC: Recent Labs  Lab 02/08/20 2017 02/09/20 0255 02/10/20 0311 02/11/20 0326  WBC 17.2* 14.0* 9.3 11.0*  NEUTROABS 13.9*  --  5.3 6.2  HGB 12.8 12.4 12.6 12.9  HCT 40.2 37.3 39.5 40.2  MCV 95.9 93.0 94.0 95.0  PLT 230 207 210 811   Basic Metabolic Panel: Recent Labs  Lab 02/08/20 2017 02/09/20 0255 02/10/20 0311 02/11/20 0326  NA  140 139 140 140  K 4.2 3.5 3.4* 4.2  CL 98 99 99 101  CO2 32 31 32 31  GLUCOSE 154* 117* 107* 111*  BUN 16 17 21 21   CREATININE 0.97 0.90 0.95 0.99  CALCIUM 9.1 8.9 8.8* 8.9  MG 2.0 2.0 1.8 2.0  PHOS  --   --  4.3 4.5   GFR: Estimated Creatinine Clearance: 75.4 mL/min (by C-G formula based on SCr of 0.99 mg/dL). Liver Function Tests: Recent Labs  Lab 02/10/20 0311  02/11/20 0326  AST 16 15  ALT 16 17  ALKPHOS 49 55  BILITOT 0.9 0.8  PROT 6.0* 6.1*  ALBUMIN 3.3* 3.4*   No results for input(s): LIPASE, AMYLASE in the last 168 hours. No results for input(s): AMMONIA in the last 168 hours. Coagulation Profile: No results for input(s): INR, PROTIME in the last 168 hours. Cardiac Enzymes: No results for input(s): CKTOTAL, CKMB, CKMBINDEX, TROPONINI in the last 168 hours. BNP (last 3 results) No results for input(s): PROBNP in the last 8760 hours. HbA1C: No results for input(s): HGBA1C in the last 72 hours. CBG: No results for input(s): GLUCAP in the last 168 hours. Lipid Profile: No results for input(s): CHOL, HDL, LDLCALC, TRIG, CHOLHDL, LDLDIRECT in the last 72 hours. Thyroid Function Tests: No results for input(s): TSH, T4TOTAL, FREET4, T3FREE, THYROIDAB in the last 72 hours. Anemia Panel: No results for input(s): VITAMINB12, FOLATE, FERRITIN, TIBC, IRON, RETICCTPCT in the last 72 hours. Urine analysis: No results found for: COLORURINE, APPEARANCEUR, LABSPEC, PHURINE, GLUCOSEU, HGBUR, BILIRUBINUR, KETONESUR, PROTEINUR, UROBILINOGEN, NITRITE, LEUKOCYTESUR Sepsis Labs: @LABRCNTIP (procalcitonin:4,lacticidven:4)  ) Recent Results (from the past 240 hour(s))  SARS CORONAVIRUS 2 (TAT 6-24 HRS) Nasopharyngeal Nasopharyngeal Swab     Status: None   Collection Time: 02/03/20  2:33 PM   Specimen: Nasopharyngeal Swab  Result Value Ref Range Status   SARS Coronavirus 2 NEGATIVE NEGATIVE Final    Comment: (NOTE) SARS-CoV-2 target nucleic acids are NOT DETECTED. The SARS-CoV-2 RNA is generally detectable in upper and lower respiratory specimens during the acute phase of infection. Negative results do not preclude SARS-CoV-2 infection, do not rule out co-infections with other pathogens, and should not be used as the sole basis for treatment or other patient management decisions. Negative results must be combined with clinical observations, patient  history, and epidemiological information. The expected result is Negative. Fact Sheet for Patients: SugarRoll.be Fact Sheet for Healthcare Providers: https://www.woods-mathews.com/ This test is not yet approved or cleared by the [place] FDA and  has been authorized for detection and/or diagnosis of SARS-CoV-2 by FDA under an Emergency Use Authorization (EUA). This EUA will remain  in effect (meaning this test can be used) for the duration of the COVID-19 declaration under Section 56 4(b)(1) of the Act, 21 U.S.C. section 360bbb-3(b)(1), unless the authorization is terminated or revoked sooner. Performed at [facility], [place] [address] [place], [place] [address]          Radiology Studies: CT CHEST WO CONTRAST  Result Date: 02/09/2020 CLINICAL DATA:  Shortness of breath EXAM: CT CHEST WITHOUT CONTRAST TECHNIQUE: Multidetector CT imaging of the chest was performed following the standard protocol without IV contrast. COMPARISON:  Chest radiograph, 02/08/2020, 07/19/2013 FINDINGS: Cardiovascular: Aortic atherosclerosis. Aortic valve calcifications. Normal heart size. Scattered coronary artery calcifications. No pericardial effusion. Mediastinum/Nodes: No enlarged mediastinal, hilar, or axillary lymph nodes. Thyroid gland, trachea, and esophagus demonstrate no significant findings. Lungs/Pleura: Dense atelectasis or consolidation of the dependent left lower lobe and  inferior lingula with associated elevation of the left hemidiaphragm. There are numerous fine centrilobular nodules of the bilateral lung apices. No pleural effusion or pneumothorax. Upper Abdomen: No acute abnormality.  Gallstones. Musculoskeletal: No chest wall mass or suspicious bone lesions identified. IMPRESSION: 1. Dense atelectasis or consolidation of the dependent left lower lobe and inferior lingula with associated elevation of the left hemidiaphragm, which may reflect  chronic scarring/atelectasis or acute infectious consolidation, generally favor chronic findings, although this appearance is new compared to radiographs dated 2014. 2. Numerous fine centrilobular nodules of the bilateral lung apices, nonspecific and infectious or inflammatory, most commonly smoking-related respiratory bronchiolitis. 3.  Coronary artery disease. 4. Aortic valve calcifications. Aortic Atherosclerosis (ICD10-I70.0). 5.  Cholelithiasis. Electronically Signed   By: Eddie Candle M.D.   On: 02/09/2020 16:27   ECHOCARDIOGRAM COMPLETE  Result Date: 02/10/2020    ECHOCARDIOGRAM REPORT   Patient Name:   PERLA ECHAVARRIA Date of Exam: 02/10/2020 Medical Rec #:  [id number]       Height:       65.0 in Accession #:    [id number]      Weight:       286.1 lb Date of Birth:  10-23-1951        BSA:          2.303 m Patient Age:    60 years        BP:           145/84 mmHg Patient Gender: F               HR:           70 bpm. Exam Location:  Inpatient Procedure: 2D Echo Indications:    Acute respiratory failure with hypoxia [facility]) [[id number]]  History:        Patient has no prior history of Echocardiogram examinations.                 Risk Factors:Hypertension, Dyslipidemia, Prediabetes and Former                 Smoker. Left rotator cuff tear.  Sonographer:    Darlina Sicilian RDCS Referring Phys: [id number] [place]  Sonographer Comments: Technically challenging study due to left rotator cuff tear. IMPRESSIONS  1. Technically difficult; normal LV systolic function; mild LVE; grade 1 diastolic dysfunction; trace AI.  2. Left ventricular ejection fraction, by estimation, is 50 to 55%. The left ventricle has low normal function. The left ventricle has no regional wall motion abnormalities. The left ventricular internal cavity size was mildly dilated. Left ventricular diastolic parameters are consistent with Grade I diastolic dysfunction (impaired relaxation).  3. Right ventricular systolic function is normal. The right  ventricular size is normal. Tricuspid regurgitation signal is inadequate for assessing PA pressure.  4. The mitral valve is normal in structure. Trivial mitral valve regurgitation. No evidence of mitral stenosis.  5. The aortic valve is normal in structure. Aortic valve regurgitation is trivial. No aortic stenosis is present.  6. The inferior vena cava is dilated in size with <50% respiratory variability, suggesting right atrial pressure of 15 mmHg. FINDINGS  Left Ventricle: Left ventricular ejection fraction, by estimation, is 50 to 55%. The left ventricle has low normal function. The left ventricle has no regional wall motion abnormalities. The left ventricular internal cavity size was mildly dilated. There is no left ventricular hypertrophy. Left ventricular diastolic parameters are consistent with Grade I diastolic dysfunction (impaired relaxation). Right Ventricle: The right ventricular  size is normal.Right ventricular systolic function is normal. Tricuspid regurgitation signal is inadequate for assessing PA pressure. The tricuspid regurgitant velocity is 1.60 m/s, and with an assumed right atrial pressure of 15 mmHg, the estimated right ventricular systolic pressure is 94.8 mmHg. Left Atrium: Left atrial size was normal in size. Right Atrium: Right atrial size was normal in size. Pericardium: There is no evidence of pericardial effusion. Mitral Valve: The mitral valve is normal in structure. Normal mobility of the mitral valve leaflets. Mild mitral annular calcification. Trivial mitral valve regurgitation. No evidence of mitral valve stenosis. Tricuspid Valve: The tricuspid valve is normal in structure. Tricuspid valve regurgitation is trivial. No evidence of tricuspid stenosis. Aortic Valve: The aortic valve is normal in structure. Aortic valve regurgitation is trivial. No aortic stenosis is present. Pulmonic Valve: The pulmonic valve was not well visualized. Pulmonic valve regurgitation is not visualized. No  evidence of pulmonic stenosis. Aorta: The aortic root is normal in size and structure. Venous: The inferior vena cava is dilated in size with less than 50% respiratory variability, suggesting right atrial pressure of 15 mmHg.  Additional Comments: Technically difficult; normal LV systolic function; mild LVE; grade 1 diastolic dysfunction; trace AI.  LEFT VENTRICLE PLAX 2D LVIDd:         5.30 cm  Diastology LVIDs:         3.60 cm  LV e' lateral:   0.06 cm/s LV PW:         0.70 cm  LV E/e' lateral: 12.0 LV IVS:        0.90 cm  LV e' medial:    0.06 cm/s LVOT diam:     2.00 cm  LV E/e' medial:  12.5 LV SV:         68 LV SV Index:   30 LVOT Area:     3.14 cm  RIGHT VENTRICLE RV S prime:     9.03 cm/s TAPSE (M-mode): 1.7 cm LEFT ATRIUM             Index LA diam:        3.60 cm 1.56 cm/m LA Vol (A2C):   38.1 ml 16.54 ml/m LA Vol (A4C):   33.8 ml 14.68 ml/m LA Biplane Vol: 35.8 ml 15.54 ml/m  AORTIC VALVE LVOT Vmax:   90.80 cm/s LVOT Vmean:  60.400 cm/s LVOT VTI:    0.218 m  AORTA Ao Root diam: 2.70 cm MITRAL VALVE               TRICUSPID VALVE MV Area (PHT): 4.49 cm    TR Peak grad:   10.2 mmHg MV Decel Time: 169 msec    TR Vmax:        160.00 cm/s MV E velocity: 0.72 cm/s MV A velocity: 93.00 cm/s  SHUNTS MV E/A ratio:  0.01        Systemic VTI:  0.22 m                            Systemic Diam: 2.00 cm Kirk Ruths MD Electronically signed by Kirk Ruths MD Signature Date/Time: 02/10/2020/1:54:11 PM    Final         Scheduled Meds: . cloNIDine  0.1 mg Oral BID  . docusate sodium  100 mg Oral BID  . enoxaparin (LOVENOX) injection  40 mg Subcutaneous Q24H  . famotidine  40 mg Oral Daily  . fluticasone furoate-vilanterol  1 puff Inhalation Daily  .  ipratropium-albuterol  3 mL Nebulization BID  . loratadine  10 mg Oral Daily  . pravastatin  40 mg Oral Daily  . venlafaxine XR  75 mg Oral Daily   Continuous Infusions: . lactated ringers Stopped (02/08/20 0850)  . methocarbamol (ROBAXIN) IV        LOS: 3 days    Time spent: 25 minutes   Dana Allan, MD  [facility] Pager #: [phone] 7PM-7AM contact night coverage as above

## 2020-02-11 NOTE — Progress Notes (Signed)
SATURATION QUALIFICATIONS: (This note is used to comply with regulatory documentation for home oxygen)  Patient Saturations on Room Air at Rest = 95%  Patient Saturations on Room Air while Ambulating = 92%  Patient Saturations on n/a Liters of oxygen while Ambulating = n/a  Please briefly explain why patient needs home oxygen: patient does not qualify for home O2.

## 2020-02-13 NOTE — Telephone Encounter (Signed)
Bronch/ebvus@4 /12/21@cone  endo@9 :00am do you want me to contact this pt? Sherry Riley

## 2020-02-13 NOTE — Telephone Encounter (Signed)
I had to move this pt to tues 02/21/20@7 :30am so dr Tamala Julian will have to do it covid is 02/17/20@10 :10 do I need to call the pt to inform them?

## 2020-02-13 NOTE — Telephone Encounter (Signed)
Please schedule the following:  Diagnosis: lung mass  Procedure: EBUS, bronch with fluoro for transbronchial biopsy  Date: 4/13 (Dr. Tamala Julian)  Alternate Date: 4/12 (Dr. Carlis Abbott)  Time: either AM or PM  Location: [facility]  Medication Restriction: none  Anticoagulate/Antiplatelet: none  Pre-op Labs Ordered: CBC, CMP, PT/INR, PTT  Imaging request: none  Please coordinate Pre-op COVID Testing

## 2020-02-14 NOTE — Telephone Encounter (Signed)
Pt has been notified of these appts Sherry Riley

## 2020-02-16 DIAGNOSIS — M12812 Other specific arthropathies, not elsewhere classified, left shoulder: Secondary | ICD-10-CM | POA: Diagnosis not present

## 2020-02-16 DIAGNOSIS — I110 Hypertensive heart disease with heart failure: Secondary | ICD-10-CM | POA: Diagnosis not present

## 2020-02-16 DIAGNOSIS — J449 Chronic obstructive pulmonary disease, unspecified: Secondary | ICD-10-CM | POA: Diagnosis not present

## 2020-02-16 DIAGNOSIS — E662 Morbid (severe) obesity with alveolar hypoventilation: Secondary | ICD-10-CM | POA: Diagnosis not present

## 2020-02-16 DIAGNOSIS — I10 Essential (primary) hypertension: Secondary | ICD-10-CM | POA: Diagnosis not present

## 2020-02-16 DIAGNOSIS — R918 Other nonspecific abnormal finding of lung field: Secondary | ICD-10-CM | POA: Diagnosis not present

## 2020-02-16 DIAGNOSIS — I2510 Atherosclerotic heart disease of native coronary artery without angina pectoris: Secondary | ICD-10-CM | POA: Diagnosis not present

## 2020-02-16 DIAGNOSIS — I700 Atherosclerosis of aorta: Secondary | ICD-10-CM | POA: Diagnosis not present

## 2020-02-16 DIAGNOSIS — I5030 Unspecified diastolic (congestive) heart failure: Secondary | ICD-10-CM | POA: Diagnosis not present

## 2020-02-16 DIAGNOSIS — I519 Heart disease, unspecified: Secondary | ICD-10-CM | POA: Diagnosis not present

## 2020-02-17 ENCOUNTER — Other Ambulatory Visit (HOSPITAL_COMMUNITY)
Admission: RE | Admit: 2020-02-17 | Discharge: 2020-02-17 | Disposition: A | Discharge status: Home / Self Care | Payer: Medicare HMO | Source: Ambulatory Visit | Attending: Internal Medicine | Admitting: Internal Medicine

## 2020-02-17 DIAGNOSIS — Z20822 Contact with and (suspected) exposure to covid-19: Secondary | ICD-10-CM | POA: Insufficient documentation

## 2020-02-17 DIAGNOSIS — Z01812 Encounter for preprocedural laboratory examination: Secondary | ICD-10-CM | POA: Diagnosis not present

## 2020-02-17 LAB — SARS CORONAVIRUS 2 (TAT 6-24 HRS): SARS Coronavirus 2: NEGATIVE

## 2020-02-20 ENCOUNTER — Other Ambulatory Visit: Payer: Self-pay

## 2020-02-20 ENCOUNTER — Encounter (HOSPITAL_COMMUNITY): Payer: Self-pay | Admitting: Internal Medicine

## 2020-02-20 NOTE — Anesthesia Preprocedure Evaluation (Addendum)
Anesthesia Evaluation  Patient identified by MRN, date of birth, ID band Patient awake    Reviewed: Allergy & Precautions, NPO status , Patient's Chart, lab work & pertinent test results  History of Anesthesia Complications Negative for: history of anesthetic complications  Airway Mallampati: II  TM Distance: >3 FB Neck ROM: Full    Dental  (+) Caps, Dental Advisory Given   Pulmonary sleep apnea , COPD,  COPD inhaler, former smoker,  02/17/2020 SARS coronavirus NEG   breath sounds clear to auscultation       Cardiovascular hypertension, Pt. on medications (-) angina Rhythm:Regular Rate:Normal  02/10/2020 ECHO: EF 50-55%, valves OK   Neuro/Psych negative neurological ROS     GI/Hepatic Neg liver ROS, GERD  Controlled,  Endo/Other  Morbid obesity  Renal/GU negative Renal ROS     Musculoskeletal   Abdominal (+) + obese,   Peds  Hematology negative hematology ROS (+)   Anesthesia Other Findings   Reproductive/Obstetrics                           Anesthesia Physical Anesthesia Plan  ASA: III  Anesthesia Plan: General   Post-op Pain Management:    Induction: Intravenous  PONV Risk Score and Plan: 3 and Ondansetron and Dexamethasone  Airway Management Planned: Oral ETT  Additional Equipment:   Intra-op Plan:   Post-operative Plan: Extubation in OR  Informed Consent: I have reviewed the patients History and Physical, chart, labs and discussed the procedure including the risks, benefits and alternatives for the proposed anesthesia with the patient or authorized representative who has indicated his/her understanding and acceptance.     Dental advisory given  Plan Discussed with: CRNA and Surgeon  Anesthesia Plan Comments: (Pt was admitted for elective rotator cuff surgery on 3/30. She was noted hypoxic post-op when working with OT - she was reportedly not symptomatic from this.  Hospital medicine team was consulted for evaluation.  Etiology initially thought to be multifactorial: Asthma, allergic rhinitis, deconditioning, and likely underlying previously undiagnosed OSA and COPD (long-time smoker until 2011). She had a CT scan which was abnormal and concerning for lung mass.  Pulmonary was consulted and outpatient bronchoscopy and EBUS recommended. At time of discharge pt underwent evaluation for home oxygen requirement which indicated that the patient did not need home oxygen. During hospitalization it was also noted that she has snoring and episodes of witnessed apneas at night.  Sleep study was recommended.   CMET and CBC from 02/11/20 reviewed, unremarkable   EKG 02/08/20 poor quality with significant artifact, difficult to interpret. EKG 01/27/20 shows NSR, rate 80, Low voltage QRS, Cannot rule out Anterior infarct , age undetermined.  TTE 02/10/20: 1. Technically difficult; normal LV systolic function; mild LVE; grade 1  diastolic dysfunction; trace AI.  2. Left ventricular ejection fraction, by estimation, is 50 to 55%. The  left ventricle has low normal function. The left ventricle has no regional  wall motion abnormalities. The left ventricular internal cavity size was  mildly dilated. Left ventricular  diastolic parameters are consistent with Grade I diastolic dysfunction  (impaired relaxation).  3. Right ventricular systolic function is normal. The right ventricular  size is normal. Tricuspid regurgitation signal is inadequate for assessing  PA pressure.  4. The mitral valve is normal in structure. Trivial mitral valve  regurgitation. No evidence of mitral stenosis.  5. The aortic valve is normal in structure. Aortic valve regurgitation is  trivial. No aortic stenosis  is present.  6. The inferior vena cava is dilated in size with <50% respiratory  variability, suggesting right atrial pressure of 15 mmHg.   Chest CT 02/09/20: IMPRESSION: 1. Dense  atelectasis or consolidation of the dependent left lower lobe and inferior lingula with associated elevation of the left hemidiaphragm, which may reflect chronic scarring/atelectasis or acute infectious consolidation, generally favor chronic findings, although this appearance is new compared to radiographs dated 2014.  2. Numerous fine centrilobular nodules of the bilateral lung apices, nonspecific and infectious or inflammatory, most commonly smoking-related respiratory bronchiolitis.  3.  Coronary artery disease.  4. Aortic valve calcifications. Aortic Atherosclerosis (ICD10-I70.0).  5.  Cholelithiasis.)      Anesthesia Quick Evaluation

## 2020-02-20 NOTE — Progress Notes (Signed)
PCP - Dr. Loletha Grayer. Street  Cardiologist - Denies  Chest x-ray - 02/08/20 (E)  EKG - DOS  Stress Test - Denies  ECHO - 02/10/20 (E)  Cardiac Cath - Denies  AICD-na PM-na LOOP-na  Sleep Study - Denies- Pt sts she has an appt coming up CPAP - Denies  LABS- 02/11/20: CBC w/D, CMP 02/17/20: COVID- Neg  ASA- Denies  ERAS- No  HA1C- Denies  Anesthesia- Yes- EKG  Pt denies having chest pain, sob, or fever during the pre-op phone call. All instructions explained to the pt, with a verbal understanding including:as of today, stop taking all Aspirin (unless instructed by your doctor) and Other Aspirin containing products, Vitamins, Fish oils, and Herbal medications. Also stop all NSAIDS i.e. Advil, Ibuprofen, Motrin, Aleve, Anaprox, Naproxen, BC, Goody Powders, and all Supplements.  Pt also instructed to self quarantine after being tested for COVID-19. The opportunity to ask questions was provided.   Coronavirus Screening  Have you experienced the following symptoms:  Cough yes/no: No Fever (>100.58F)  yes/no: No Runny nose yes/no: No Sore throat yes/no: No Difficulty breathing/shortness of breath  yes/no: No  Have you or a family member traveled in the last 14 days and where? yes/no: No   If the patient indicates "YES" to the above questions, their PAT will be rescheduled to limit the exposure to others and, the surgeon will be notified. THE PATIENT WILL NEED TO BE ASYMPTOMATIC FOR 14 DAYS.   If the patient is not experiencing any of these symptoms, the PAT nurse will instruct them to NOT bring anyone with them to their appointment since they may have these symptoms or traveled as well.   Please remind your patients and families that hospital visitation restrictions are in effect and the importance of the restrictions.

## 2020-02-20 NOTE — Progress Notes (Signed)
Anesthesia Chart Review: Same day workup  Pt was admitted for elective rotator cuff surgery on 3/30. She was noted hypoxic post-op when working with OT - she was reportedly not symptomatic from this. Hospital medicine team was consulted for evaluation.  Etiology initially thought to be multifactorial: Asthma, allergic rhinitis, deconditioning, and likely underlying previously undiagnosed OSA and COPD (long-time smoker until 2011). She had a CT scan which was abnormal and concerning for lung mass.  Pulmonary was consulted and outpatient bronchoscopy and EBUS recommended. At time of discharge pt underwent evaluation for home oxygen requirement which indicated that the patient did not need home oxygen. During hospitalization it was also noted that she has snoring and episodes of witnessed apneas at night.  Sleep study was recommended.   CMET and CBC from 02/11/20 reviewed, unremarkable   EKG 02/08/20 poor quality with significant artifact, difficult to interpret. EKG 01/27/20 shows NSR, rate 80, Low voltage QRS, Cannot rule out Anterior infarct , age undetermined.  TTE 02/10/20: 1. Technically difficult; normal LV systolic function; mild LVE; grade 1  diastolic dysfunction; trace AI.  2. Left ventricular ejection fraction, by estimation, is 50 to 55%. The  left ventricle has low normal function. The left ventricle has no regional  wall motion abnormalities. The left ventricular internal cavity size was  mildly dilated. Left ventricular  diastolic parameters are consistent with Grade I diastolic dysfunction  (impaired relaxation).  3. Right ventricular systolic function is normal. The right ventricular  size is normal. Tricuspid regurgitation signal is inadequate for assessing  PA pressure.  4. The mitral valve is normal in structure. Trivial mitral valve  regurgitation. No evidence of mitral stenosis.  5. The aortic valve is normal in structure. Aortic valve regurgitation is  trivial. No aortic  stenosis is present.  6. The inferior vena cava is dilated in size with <50% respiratory  variability, suggesting right atrial pressure of 15 mmHg.   Chest CT 02/09/20: IMPRESSION: 1. Dense atelectasis or consolidation of the dependent left lower lobe and inferior lingula with associated elevation of the left hemidiaphragm, which may reflect chronic scarring/atelectasis or acute infectious consolidation, generally favor chronic findings, although this appearance is new compared to radiographs dated 2014.  2. Numerous fine centrilobular nodules of the bilateral lung apices, nonspecific and infectious or inflammatory, most commonly smoking-related respiratory bronchiolitis.  3.  Coronary artery disease.  4. Aortic valve calcifications. Aortic Atherosclerosis (ICD10-I70.0).  5.  Cholelithiasis.   Wynonia Musty [facility]/Anesthesiology Phone [phone] 02/20/2020 3:16 PM'

## 2020-02-21 ENCOUNTER — Telehealth: Payer: Self-pay | Admitting: Critical Care Medicine

## 2020-02-21 ENCOUNTER — Ambulatory Visit (HOSPITAL_COMMUNITY): Payer: Medicare HMO | Admitting: Physician Assistant

## 2020-02-21 ENCOUNTER — Ambulatory Visit (HOSPITAL_COMMUNITY): Payer: Medicare HMO

## 2020-02-21 ENCOUNTER — Encounter (HOSPITAL_COMMUNITY): Payer: Self-pay | Admitting: Internal Medicine

## 2020-02-21 ENCOUNTER — Ambulatory Visit (HOSPITAL_COMMUNITY)
Admission: RE | Admit: 2020-02-21 | Discharge: 2020-02-21 | Disposition: A | Discharge status: Home / Self Care | Payer: Medicare HMO | Attending: Internal Medicine | Admitting: Internal Medicine

## 2020-02-21 ENCOUNTER — Encounter (HOSPITAL_COMMUNITY)
Admission: RE | Disposition: A | Discharge status: Home / Self Care | Payer: Self-pay | Source: Home / Self Care | Attending: Internal Medicine

## 2020-02-21 ENCOUNTER — Other Ambulatory Visit: Payer: Self-pay

## 2020-02-21 DIAGNOSIS — J45909 Unspecified asthma, uncomplicated: Secondary | ICD-10-CM | POA: Insufficient documentation

## 2020-02-21 DIAGNOSIS — Z888 Allergy status to other drugs, medicaments and biological substances status: Secondary | ICD-10-CM | POA: Insufficient documentation

## 2020-02-21 DIAGNOSIS — Z833 Family history of diabetes mellitus: Secondary | ICD-10-CM | POA: Diagnosis not present

## 2020-02-21 DIAGNOSIS — J9601 Acute respiratory failure with hypoxia: Secondary | ICD-10-CM | POA: Diagnosis not present

## 2020-02-21 DIAGNOSIS — R7303 Prediabetes: Secondary | ICD-10-CM | POA: Diagnosis not present

## 2020-02-21 DIAGNOSIS — Z825 Family history of asthma and other chronic lower respiratory diseases: Secondary | ICD-10-CM | POA: Diagnosis not present

## 2020-02-21 DIAGNOSIS — M797 Fibromyalgia: Secondary | ICD-10-CM | POA: Insufficient documentation

## 2020-02-21 DIAGNOSIS — C3492 Malignant neoplasm of unspecified part of left bronchus or lung: Secondary | ICD-10-CM | POA: Diagnosis not present

## 2020-02-21 DIAGNOSIS — Z87891 Personal history of nicotine dependence: Secondary | ICD-10-CM | POA: Insufficient documentation

## 2020-02-21 DIAGNOSIS — Z7951 Long term (current) use of inhaled steroids: Secondary | ICD-10-CM | POA: Insufficient documentation

## 2020-02-21 DIAGNOSIS — Z791 Long term (current) use of non-steroidal anti-inflammatories (NSAID): Secondary | ICD-10-CM | POA: Diagnosis not present

## 2020-02-21 DIAGNOSIS — Z8249 Family history of ischemic heart disease and other diseases of the circulatory system: Secondary | ICD-10-CM | POA: Diagnosis not present

## 2020-02-21 DIAGNOSIS — K219 Gastro-esophageal reflux disease without esophagitis: Secondary | ICD-10-CM | POA: Insufficient documentation

## 2020-02-21 DIAGNOSIS — R918 Other nonspecific abnormal finding of lung field: Secondary | ICD-10-CM

## 2020-02-21 DIAGNOSIS — Z9889 Other specified postprocedural states: Secondary | ICD-10-CM

## 2020-02-21 DIAGNOSIS — Z801 Family history of malignant neoplasm of trachea, bronchus and lung: Secondary | ICD-10-CM | POA: Insufficient documentation

## 2020-02-21 DIAGNOSIS — Z79899 Other long term (current) drug therapy: Secondary | ICD-10-CM | POA: Insufficient documentation

## 2020-02-21 DIAGNOSIS — E7800 Pure hypercholesterolemia, unspecified: Secondary | ICD-10-CM | POA: Insufficient documentation

## 2020-02-21 DIAGNOSIS — F329 Major depressive disorder, single episode, unspecified: Secondary | ICD-10-CM | POA: Diagnosis not present

## 2020-02-21 DIAGNOSIS — I5031 Acute diastolic (congestive) heart failure: Secondary | ICD-10-CM | POA: Diagnosis not present

## 2020-02-21 DIAGNOSIS — I110 Hypertensive heart disease with heart failure: Secondary | ICD-10-CM | POA: Diagnosis not present

## 2020-02-21 DIAGNOSIS — R69 Illness, unspecified: Secondary | ICD-10-CM | POA: Diagnosis not present

## 2020-02-21 DIAGNOSIS — C3432 Malignant neoplasm of lower lobe, left bronchus or lung: Secondary | ICD-10-CM | POA: Diagnosis not present

## 2020-02-21 HISTORY — PX: VIDEO BRONCHOSCOPY: SHX5072

## 2020-02-21 HISTORY — PX: FINE NEEDLE ASPIRATION: SHX5430

## 2020-02-21 HISTORY — PX: BIOPSY: SHX5522

## 2020-02-21 HISTORY — DX: Other complications of anesthesia, initial encounter: T88.59XA

## 2020-02-21 HISTORY — PX: ENDOBRONCHIAL ULTRASOUND: SHX5096

## 2020-02-21 LAB — ABO/RH: ABO/RH(D): A NEG

## 2020-02-21 LAB — TYPE AND SCREEN
ABO/RH(D): A NEG
Antibody Screen: NEGATIVE

## 2020-02-21 LAB — PROTIME-INR
INR: 1.1 (ref 0.8–1.2)
Prothrombin Time: 13.7 seconds (ref 11.4–15.2)

## 2020-02-21 LAB — APTT: aPTT: 31 seconds (ref 24–36)

## 2020-02-21 SURGERY — BRONCHOSCOPY, WITH FLUOROSCOPY
Anesthesia: General

## 2020-02-21 MED ORDER — LIDOCAINE 2% (20 MG/ML) 5 ML SYRINGE
INTRAMUSCULAR | Status: DC | PRN
  Administered 2020-02-21: 30 mg via INTRAVENOUS

## 2020-02-21 MED ORDER — SUGAMMADEX SODIUM 200 MG/2ML IV SOLN
INTRAVENOUS | Status: DC | PRN
  Administered 2020-02-21: 300 mg via INTRAVENOUS

## 2020-02-21 MED ORDER — IPRATROPIUM-ALBUTEROL 0.5-2.5 (3) MG/3ML IN SOLN
3.0000 mL | RESPIRATORY_TRACT | Status: DC | PRN
  Administered 2020-02-21: 3 mL via RESPIRATORY_TRACT

## 2020-02-21 MED ORDER — PROPOFOL 10 MG/ML IV BOLUS
INTRAVENOUS | Status: DC | PRN
  Administered 2020-02-21: 150 mg via INTRAVENOUS

## 2020-02-21 MED ORDER — ROCURONIUM BROMIDE 100 MG/10ML IV SOLN
INTRAVENOUS | Status: DC | PRN
  Administered 2020-02-21: 30 mg via INTRAVENOUS
  Administered 2020-02-21: 20 mg via INTRAVENOUS

## 2020-02-21 MED ORDER — SUCCINYLCHOLINE CHLORIDE 20 MG/ML IJ SOLN
INTRAMUSCULAR | Status: DC | PRN
  Administered 2020-02-21: 120 mg via INTRAVENOUS

## 2020-02-21 MED ORDER — EPHEDRINE SULFATE-NACL 50-0.9 MG/10ML-% IV SOSY
PREFILLED_SYRINGE | INTRAVENOUS | Status: DC | PRN
  Administered 2020-02-21 (×2): 10 mg via INTRAVENOUS

## 2020-02-21 MED ORDER — ONDANSETRON HCL 4 MG/2ML IJ SOLN
INTRAMUSCULAR | Status: DC | PRN
  Administered 2020-02-21: 4 mg via INTRAVENOUS

## 2020-02-21 MED ORDER — LACTATED RINGERS IV SOLN: INTRAVENOUS | Status: DC | PRN

## 2020-02-21 MED ORDER — PHENYLEPHRINE HCL (PRESSORS) 10 MG/ML IV SOLN
INTRAVENOUS | Status: DC | PRN
  Administered 2020-02-21: 120 ug via INTRAVENOUS
  Administered 2020-02-21: 100 ug via INTRAVENOUS

## 2020-02-21 MED ORDER — IPRATROPIUM-ALBUTEROL 0.5-2.5 (3) MG/3ML IN SOLN
RESPIRATORY_TRACT | Status: AC
  Filled 2020-02-21: qty 3

## 2020-02-21 MED ORDER — DEXAMETHASONE SODIUM PHOSPHATE 10 MG/ML IJ SOLN
INTRAMUSCULAR | Status: DC | PRN
  Administered 2020-02-21: 10 mg via INTRAVENOUS

## 2020-02-21 SURGICAL SUPPLY — 35 items
ADAPTER VALVE BIOPSY EBUS (MISCELLANEOUS) IMPLANT
ADPTR VALVE BIOPSY EBUS (MISCELLANEOUS)
BRUSH CYTOL CELLEBRITY 1.5X140 (MISCELLANEOUS) IMPLANT
CANISTER SUCT 3000ML PPV (MISCELLANEOUS) ×3 IMPLANT
CONT SPEC 4OZ CLIKSEAL STRL BL (MISCELLANEOUS) ×3 IMPLANT
COVER BACK TABLE 60X90IN (DRAPES) ×3 IMPLANT
FORCEPS BIOP RJ4 1.8 (CUTTING FORCEPS) IMPLANT
GAUZE SPONGE 4X4 12PLY STRL (GAUZE/BANDAGES/DRESSINGS) ×3 IMPLANT
GLOVE BIO SURGEON STRL SZ7.5 (GLOVE) ×3 IMPLANT
GOWN STRL REUS W/ TWL LRG LVL3 (GOWN DISPOSABLE) ×2 IMPLANT
GOWN STRL REUS W/ TWL XL LVL3 (GOWN DISPOSABLE) ×2 IMPLANT
GOWN STRL REUS W/TWL LRG LVL3 (GOWN DISPOSABLE) ×1
GOWN STRL REUS W/TWL XL LVL3 (GOWN DISPOSABLE) ×1
KIT CLEAN ENDO COMPLIANCE (KITS) ×6 IMPLANT
KIT TURNOVER KIT B (KITS) ×3 IMPLANT
MARKER SKIN DUAL TIP RULER LAB (MISCELLANEOUS) ×3 IMPLANT
NDL ASPIRATION VIZISHOT 19G (NEEDLE) IMPLANT
NDL ASPIRATION VIZISHOT 21G (NEEDLE) IMPLANT
NEEDLE ASPIRATION VIZISHOT 19G (NEEDLE) IMPLANT
NEEDLE ASPIRATION VIZISHOT 21G (NEEDLE) IMPLANT
NS IRRIG 1000ML POUR BTL (IV SOLUTION) ×3 IMPLANT
OIL SILICONE PENTAX (PARTS (SERVICE/REPAIRS)) ×3 IMPLANT
PAD ARMBOARD 7.5X6 YLW CONV (MISCELLANEOUS) ×6 IMPLANT
SYR 20ML ECCENTRIC (SYRINGE) ×6 IMPLANT
SYR 20ML LL LF (SYRINGE) ×6 IMPLANT
SYR 50ML SLIP (SYRINGE) IMPLANT
SYR 5ML LUER SLIP (SYRINGE) ×3 IMPLANT
TOWEL GREEN STERILE FF (TOWEL DISPOSABLE) ×3 IMPLANT
TRAP SPECIMEN MUCOUS 40CC (MISCELLANEOUS) IMPLANT
TUBE CONNECTING 20X1/4 (TUBING) ×6 IMPLANT
UNDERPAD 30X30 (UNDERPADS AND DIAPERS) ×3 IMPLANT
VALVE BIOPSY  SINGLE USE (MISCELLANEOUS) ×1
VALVE BIOPSY SINGLE USE (MISCELLANEOUS) ×2 IMPLANT
VALVE SUCTION BRONCHIO DISP (MISCELLANEOUS) ×3 IMPLANT
WATER STERILE IRR 1000ML POUR (IV SOLUTION) ×3 IMPLANT

## 2020-02-21 NOTE — Discharge Instructions (Signed)
General Anesthesia, Adult, Care After This sheet gives you information about how to care for yourself after your procedure. Your health care provider may also give you more specific instructions. If you have problems or questions, contact your health care provider. What can I expect after the procedure? After the procedure, the following side effects are common:  Pain or discomfort at the IV site.  Nausea.  Vomiting.  Sore throat.  Trouble concentrating.  Feeling cold or chills.  Weak or tired.  Sleepiness and fatigue.  Soreness and body aches. These side effects can affect parts of the body that were not involved in surgery. Follow these instructions at home:  For at least 24 hours after the procedure:  Have a responsible adult stay with you. It is important to have someone help care for you until you are awake and alert.  Rest as needed.  Do not: ? Participate in activities in which you could fall or become injured. ? Drive. ? Use heavy machinery. ? Drink alcohol. ? Take sleeping pills or medicines that cause drowsiness. ? Make important decisions or sign legal documents. ? Take care of children on your own. Eating and drinking  Follow any instructions from your health care provider about eating or drinking restrictions.  When you feel hungry, start by eating small amounts of foods that are soft and easy to digest (bland), such as toast. Gradually return to your regular diet.  Drink enough fluid to keep your urine pale yellow.  If you vomit, rehydrate by drinking water, juice, or clear broth. General instructions  If you have sleep apnea, surgery and certain medicines can increase your risk for breathing problems. Follow instructions from your health care provider about wearing your sleep device: ? Anytime you are sleeping, including during daytime naps. ? While taking prescription pain medicines, sleeping medicines, or medicines that make you drowsy.  Return to  your normal activities as told by your health care provider. Ask your health care provider what activities are safe for you.  Take over-the-counter and prescription medicines only as told by your health care provider.  If you smoke, do not smoke without supervision.  Keep all follow-up visits as told by your health care provider. This is important. Contact a health care provider if:  You have nausea or vomiting that does not get better with medicine.  You cannot eat or drink without vomiting.  You have pain that does not get better with medicine.  You are unable to pass urine.  You develop a skin rash.  You have a fever.  You have redness around your IV site that gets worse. Get help right away if:  You have difficulty breathing.  You have chest pain.  You have blood in your urine or stool, or you vomit blood. Summary  After the procedure, it is common to have a sore throat or nausea. It is also common to feel tired.  Have a responsible adult stay with you for the first 24 hours after general anesthesia. It is important to have someone help care for you until you are awake and alert.  When you feel hungry, start by eating small amounts of foods that are soft and easy to digest (bland), such as toast. Gradually return to your regular diet.  Drink enough fluid to keep your urine pale yellow.  Return to your normal activities as told by your health care provider. Ask your health care provider what activities are safe for you. This information is not   intended to replace advice given to you by your health care provider. Make sure you discuss any questions you have with your health care provider. Document Revised: 10/30/2017 Document Reviewed: 06/12/2017 Elsevier Patient Education  2020 [employer]  

## 2020-02-21 NOTE — Transfer of Care (Signed)
Immediate Anesthesia Transfer of Care Note  Patient: Sherry Riley  Procedure(s) Performed: VIDEO BRONCHOSCOPY WITH FLUORO ENDOBRONCHIAL ULTRASOUND FINE NEEDLE ASPIRATION (FNA) LINEAR BIOPSY  Patient Location: PACU  Anesthesia Type:General  Level of Consciousness: awake, alert  and oriented  Airway & Oxygen Therapy: Patient Spontanous Breathing and Patient connected to nasal cannula oxygen  Post-op Assessment: Report given to RN and Post -op Vital signs reviewed and stable  Post vital signs: Reviewed and stable  Last Vitals:  Vitals Value Taken Time  BP 121/108 02/21/20 0945  Temp 36.5 C 02/21/20 0945  Pulse 75 02/21/20 0954  Resp 17 02/21/20 0954  SpO2 98 % 02/21/20 0954  Vitals shown include unvalidated device data.  Last Pain:  Vitals:   02/21/20 0945  TempSrc:   PainSc: 0-No pain      Patients Stated Pain Goal: 2 (90/30/14 9969)  Complications: No apparent anesthesia complications

## 2020-02-21 NOTE — Discharge Summary (Signed)
Physician Discharge Summary  Patient ID: Sherry Riley MRN: [id number] DOB/AGE: Apr 05, 1951 69 y.o.  Admit date: 02/21/2020 Discharge date: 02/21/2020  Admission Diagnoses: left lung mass  Discharge Diagnoses:  Left lung mass  Discharged Condition: stable  Hospital Course: Admitted for bronchoscopy with EBUS and biopsy of LLL. She underwent general anesthesia, which she tolerated well. She was extubated successfully. She recovered well and was able to be discharged home with her daughter in law Sherry Riley.  Consults: None  Significant Diagnostic Studies: see procedure note  Treatments: procedures: see procedure note    Disposition:  Home     Allergies as of 02/21/2020      Reactions   Atorvastatin Other (See Comments)   Myalgia   Lyrica [pregabalin] Rash   Actonel [risedronate Sodium]    Coreg [carvedilol] Hives   Hydralazine Hcl Other (See Comments), Cough   Chest pain, dyspnea   Ace Inhibitors Rash   Diltiazem Hcl Itching, Rash   Diovan Hct [valsartan-hydrochlorothiazide] Rash   Valsartan Rash      Medication List    TAKE these medications   albuterol (2.5 MG/3ML) 0.083% nebulizer solution Commonly known as: PROVENTIL Take 2.5 mg by nebulization every 4 (four) hours as needed for wheezing or shortness of breath.   ProAir HFA 108 (90 Base) MCG/ACT inhaler Generic drug: albuterol Inhale 2 puffs into the lungs every 4 (four) hours as needed for wheezing or shortness of breath. Inhale two puffs every four to six hours as needed for breathing problems.   baclofen 10 MG tablet Commonly known as: LIORESAL Take 1 tablet (10 mg total) by mouth 3 (three) times daily as needed for muscle spasms.   budesonide-formoterol 160-4.5 MCG/ACT inhaler Commonly known as: Symbicort Inhale 1 puff into the lungs 2 (two) times daily.   CALCIUM MAGNESIUM PO Take 1,000 mg by mouth 4 (four) times daily.   celecoxib 100 MG capsule Commonly known as: CeleBREX Take 1 capsule (100 mg  total) by mouth 2 (two) times daily for 14 days.   chlorthalidone 25 MG tablet Commonly known as: HYGROTON Take 25 mg by mouth daily.   cloNIDine 0.1 MG tablet Commonly known as: CATAPRES Take 1 tablet (0.1 mg total) by mouth 2 (two) times daily.   famotidine 40 MG tablet Commonly known as: PEPCID Take 40 mg by mouth daily.   Fish Oil 1200 MG Caps Take 1,200 mg by mouth daily.   fluticasone 50 MCG/ACT nasal spray Commonly known as: Flonase Place 1 spray into both nostrils daily.   gabapentin 800 MG tablet Commonly known as: NEURONTIN Take 800 mg by mouth 4 (four) times daily.   ipratropium 0.02 % nebulizer solution Commonly known as: ATROVENT Can use one vial in nebulizer every four to six hours as needed for cough or wheeze.  Mix with Albuterol. What changed:   how much to take  how to take this  when to take this  reasons to take this   ipratropium-albuterol 0.5-2.5 (3) MG/3ML Soln Commonly known as: DUONEB Take 3 mLs by nebulization 2 (two) times daily.   loratadine 10 MG tablet Commonly known as: CLARITIN Take 10 mg by mouth daily.   LORazepam 0.5 MG tablet Commonly known as: ATIVAN Take 0.5 mg by mouth 2 (two) times daily as needed (Depression).   meclizine 12.5 MG tablet Commonly known as: ANTIVERT Take 12.5 mg by mouth 2 (two) times daily as needed for dizziness.   metroNIDAZOLE 0.75 % cream Commonly known as: METROCREAM Apply 1 application topically  daily as needed (acne).   ondansetron 4 MG tablet Commonly known as: Zofran Take 1 tablet (4 mg total) by mouth every 8 (eight) hours as needed for nausea or vomiting.   OSTEO BI-FLEX JOINT SHIELD PO Take 1 tablet by mouth in the morning and at bedtime.   potassium gluconate 595 (99 K) MG Tabs tablet Take 595 mg by mouth daily.   pravastatin 40 MG tablet Commonly known as: PRAVACHOL Take 40 mg by mouth daily.   venlafaxine 75 MG tablet Commonly known as: EFFEXOR Take 75 mg by mouth daily.    Vitamin D3 25 MCG tablet Commonly known as: Vitamin D Take 1,000 Units by mouth daily.        Signed: Julian Hy 02/21/2020, 9:51 AM

## 2020-02-21 NOTE — H&P (Signed)
Sherry Riley is an 69 y.o. female.   Chief Complaint: lung mass HPI: No supplemental oxygen at discharge. Using inhalers daily. No change in symptoms since discharge. NPO this morning other than meds with a sip of water. Daughter in law [name] will drive her home.  Past Medical History:  Diagnosis Date  . Asthma   . Complication of anesthesia    Hard to wake up.  . Depression   . Fibromyalgia   . GERD (gastroesophageal reflux disease)   . High blood pressure   . High cholesterol   . Pneumonia   . Pre-diabetes     Past Surgical History:  Procedure Laterality Date  . ARTHROSCOPY KNEE W/ DRILLING Right   . CYST EXCISION  2018   Back cyst  . HERNIA REPAIR  2015  . SHOULDER ARTHROSCOPY WITH BANKART REPAIR Left 02/07/2020   Procedure: SHOULDER ARTHROSCOPY WITH BANKART REPAIR;  Surgeon: Renette Butters, MD;  Location: [facility];  Service: Orthopedics;  Laterality: Left;    Family History  Problem Relation Age of Onset  . Asthma Mother   . Heart attack Mother   . Lung cancer Father   . Diabetes Brother    Social History:  reports that she quit smoking about 10 years ago. Her smoking use included cigarettes. She quit after 36.00 years of use. She has never used smokeless tobacco. She reports that she does not drink alcohol or use drugs.  Allergies:  Allergies  Allergen Reactions  . Atorvastatin Other (See Comments)    Myalgia  . Lyrica [Pregabalin] Rash  . Actonel [Risedronate Sodium]   . Coreg [Carvedilol] Hives  . Hydralazine Hcl Other (See Comments) and Cough    Chest pain, dyspnea  . Ace Inhibitors Rash  . Diltiazem Hcl Itching and Rash  . Diovan Hct [Valsartan-Hydrochlorothiazide] Rash  . Valsartan Rash    Medications Prior to Admission  Medication Sig Dispense Refill  . albuterol (PROAIR HFA) 108 (90 Base) MCG/ACT inhaler Inhale 2 puffs into the lungs every 4 (four) hours as needed for wheezing or shortness of breath. Inhale two puffs every four to six hours as  needed for breathing problems.     Marland Kitchen albuterol (PROVENTIL) (2.5 MG/3ML) 0.083% nebulizer solution Take 2.5 mg by nebulization every 4 (four) hours as needed for wheezing or shortness of breath.    . budesonide-formoterol (SYMBICORT) 160-4.5 MCG/ACT inhaler Inhale 1 puff into the lungs 2 (two) times daily. 1 Inhaler 12  . Calcium-Magnesium-Vitamin D (CALCIUM MAGNESIUM PO) Take 1,000 mg by mouth 4 (four) times daily.     . celecoxib (CELEBREX) 100 MG capsule Take 1 capsule (100 mg total) by mouth 2 (two) times daily for 14 days. 28 capsule 0  . chlorthalidone (HYGROTON) 25 MG tablet Take 25 mg by mouth daily.  3  . cloNIDine (CATAPRES) 0.1 MG tablet Take 1 tablet (0.1 mg total) by mouth 2 (two) times daily. 60 tablet 0  . famotidine (PEPCID) 40 MG tablet Take 40 mg by mouth daily.    . fluticasone (FLONASE) 50 MCG/ACT nasal spray Place 1 spray into both nostrils daily. 15 mL 0  . gabapentin (NEURONTIN) 800 MG tablet Take 800 mg by mouth 4 (four) times daily.   5  . ipratropium (ATROVENT) 0.02 % nebulizer solution Can use one vial in nebulizer every four to six hours as needed for cough or wheeze.  Mix with Albuterol. (Patient taking differently: Take 0.5 mg by nebulization every 2 (two) hours as needed for  wheezing or shortness of breath. Can use one vial in nebulizer every four to six hours as needed for cough or wheeze.  Mix with Albuterol.) 150 mL 1  . ipratropium-albuterol (DUONEB) 0.5-2.5 (3) MG/3ML SOLN Take 3 mLs by nebulization 2 (two) times daily. 360 mL 0  . loratadine (CLARITIN) 10 MG tablet Take 10 mg by mouth daily.    Marland Kitchen LORazepam (ATIVAN) 0.5 MG tablet Take 0.5 mg by mouth 2 (two) times daily as needed (Depression).   2  . meclizine (ANTIVERT) 12.5 MG tablet Take 12.5 mg by mouth 2 (two) times daily as needed for dizziness.   1  . metroNIDAZOLE (METROCREAM) 0.75 % cream Apply 1 application topically daily as needed (acne).   30  . Misc Natural Products (OSTEO BI-FLEX JOINT SHIELD PO) Take  1 tablet by mouth in the morning and at bedtime.     . Omega-3 Fatty Acids (FISH OIL) 1200 MG CAPS Take 1,200 mg by mouth daily.     . ondansetron (ZOFRAN) 4 MG tablet Take 1 tablet (4 mg total) by mouth every 8 (eight) hours as needed for nausea or vomiting. 20 tablet 0  . potassium gluconate 595 (99 K) MG TABS tablet Take 595 mg by mouth daily.    . pravastatin (PRAVACHOL) 40 MG tablet Take 40 mg by mouth daily.     Marland Kitchen venlafaxine (EFFEXOR) 75 MG tablet Take 75 mg by mouth daily.     . Vitamin D3 (VITAMIN D) 25 MCG tablet Take 1,000 Units by mouth daily.    . baclofen (LIORESAL) 10 MG tablet Take 1 tablet (10 mg total) by mouth 3 (three) times daily as needed for muscle spasms. 20 each 0    Results for orders placed or performed during the hospital encounter of 02/21/20 (from the past 48 hour(s))  Protime-INR     Status: None   Collection Time: 02/21/20  6:33 AM  Result Value Ref Range   Prothrombin Time 13.7 11.4 - 15.2 seconds   INR 1.1 0.8 - 1.2    Comment: (NOTE) INR goal varies based on device and disease states. Performed at [facility], [place] [address], [place], [place] [address]   APTT     Status: None   Collection Time: 02/21/20  6:33 AM  Result Value Ref Range   aPTT 31 24 - 36 seconds    Comment: Performed at [facility] [address] [place], [place] [address]   No results found.  Review of Systems  Constitutional: Negative.   HENT: Negative.   Respiratory: Negative for shortness of breath and wheezing.   Cardiovascular: Negative.     Blood pressure (!) 134/57, pulse 76, temperature 97.9 F (36.6 C), temperature source Oral, resp. rate 18, height 5\' 5"  (1.651 m), weight 124.3 kg, SpO2 95 %. Physical Exam  Vitals reviewed. Constitutional: She appears well-developed and well-nourished. No distress.  HENT:  Head: Normocephalic and atraumatic.  Cardiovascular: Normal rate and regular rhythm.  No murmur heard. GI: Soft. She exhibits no distension.  There is no abdominal tenderness.  Musculoskeletal:        General: No deformity or edema.     Cervical back: Neck supple.  Neurological: She is alert. Coordination normal.  Skin: Skin is warm and dry. No rash noted.  Psychiatric: She has a normal mood and affect. Her behavior is normal.     Assessment/Plan Lung mass -will proceed with bronchoscopy with transbronchial biopsy, EBUS -consent signed & witnessed,  all questions answered  Julian Hy, DO 02/21/2020, 7:17 AM

## 2020-02-21 NOTE — Procedures (Signed)
Video Bronchoscopy with Endobronchial Ultrasound Procedure Note  Date of Operation: 02/21/2020  Pre-op Diagnosis: left lung mass  Post-op Diagnosis: left lung mass  Surgeon: Julian Hy  Assistants:   Anesthesia: General endotracheal anesthesia  Operation: Flexible video fiberoptic bronchoscopy with endobronchial ultrasound and biopsies. Transbronchial biopsies left lower lobe.  Estimated Blood Loss: Minimal  Complications: none  Indications and History: Sherry Riley is a 69 y.o. female with a LLL mass.  The risks, benefits, complications, treatment options and expected outcomes were discussed with the patient.  The possibilities of pneumothorax, pneumonia, reaction to medication, pulmonary aspiration, perforation of a viscus, bleeding, failure to diagnose a condition, and creating a complication requiring transfusion or operation were discussed with the patient who freely signed the consent. All questions were answered.   Description of Procedure: The patient was examined in the preoperative area and history and data from the preprocedure consultation were reviewed. It was deemed appropriate to proceed.  The patient was taken to endoscopy, identified as Sherry Riley with DOB 19-Jun-1951 and MRN [id number], and the procedure verified as Flexible Video Fiberoptic Bronchoscopy, EBUS.  A Time Out was held and the above information confirmed. General anesthesia was initiated and the patient  was orally intubated. The video fiberoptic bronchoscope was introduced via the endotracheal tube and a general inspection was performed which showed normal airways. The standard scope was then withdrawn and the endobronchial ultrasound was used to identify and characterize the peratracheal, hilar, and bronchial lymph nodes. Inspection showed 1cm station 7LN. Using real-time ultrasound guidance, Wang needle biopsies were take from Station 7 nodes and were sent for cytology and flow cytometry. All other  lymph node stations were normal. Subsequently LLL transbronchial biopsies were taken for pathology, flow cytometry, and micro. The patient tolerated the procedure well without apparent complications. There was no significant blood loss. The bronchoscope was withdrawn. Anesthesia was reversed, and the patient was taken to the PACU for recovery.   Samples: 1. Wang needle biopsies from 7 node 2. Transbronchial biopsies from LLL   Plans:  The patient will be discharged from the PACU to home when recovered from anesthesia. We will review the cytology, pathology and microbiology results with the patient when they become available. Outpatient followup will be with Dr. Carlis Abbott.   Julian Hy, DO 02/21/20 9:43 AM [facility]

## 2020-02-21 NOTE — Anesthesia Procedure Notes (Addendum)
Procedure Name: Intubation Date/Time: 02/21/2020 7:51 AM Performed by: Eligha Bridegroom, CRNA Pre-anesthesia Checklist: Patient identified, Emergency Drugs available, Suction available, Patient being monitored and Timeout performed Patient Re-evaluated:Patient Re-evaluated prior to induction Oxygen Delivery Method: Circle system utilized Preoxygenation: Pre-oxygenation with 100% oxygen Induction Type: IV induction Ventilation: Mask ventilation without difficulty Laryngoscope Size: Mac and 3 Grade View: Grade I Tube type: Oral Tube size: 8.0 mm Number of attempts: 1 Airway Equipment and Method: Stylet Placement Confirmation: ETT inserted through vocal cords under direct vision,  positive ETCO2 and breath sounds checked- equal and bilateral Secured at: 22 cm Tube secured with: Tape Dental Injury: Teeth and Oropharynx as per pre-operative assessment and Injury to lip

## 2020-02-21 NOTE — Anesthesia Postprocedure Evaluation (Signed)
Anesthesia Post Note  Patient: Sherry Riley  Procedure(s) Performed: VIDEO BRONCHOSCOPY WITH FLUORO ENDOBRONCHIAL ULTRASOUND FINE NEEDLE ASPIRATION (FNA) LINEAR BIOPSY     Patient location during evaluation: PACU Anesthesia Type: General Level of consciousness: awake and alert, oriented and patient cooperative Pain management: pain level controlled Vital Signs Assessment: post-procedure vital signs reviewed and stable Respiratory status: spontaneous breathing, nonlabored ventilation and respiratory function stable Cardiovascular status: blood pressure returned to baseline and stable Postop Assessment: no apparent nausea or vomiting and adequate PO intake Anesthetic complications: no    Last Vitals:  Vitals:   02/21/20 1030 02/21/20 1035  BP: 102/71   Pulse: 81   Resp: (!) 22   Temp: 36.5 C   SpO2: 100% 95%    Last Pain:  Vitals:   02/21/20 1015  TempSrc:   PainSc: 0-No pain                 Syndey Jaskolski,E. Asal Teas

## 2020-02-21 NOTE — Progress Notes (Signed)
Patient ambulated to bathroom and to phase II. Patient maintained an O2 sat of 94-98% the entire time.

## 2020-02-21 NOTE — Telephone Encounter (Signed)
ATC pt, no answer. Left message for pt to call back.  

## 2020-02-22 ENCOUNTER — Encounter: Payer: Self-pay | Admitting: *Deleted

## 2020-02-23 LAB — SURGICAL PATHOLOGY

## 2020-02-23 LAB — CYTOLOGY - NON PAP

## 2020-02-26 LAB — AEROBIC/ANAEROBIC CULTURE W GRAM STAIN (SURGICAL/DEEP WOUND)
Culture: NO GROWTH
Gram Stain: NONE SEEN

## 2020-02-27 DIAGNOSIS — M25612 Stiffness of left shoulder, not elsewhere classified: Secondary | ICD-10-CM | POA: Diagnosis not present

## 2020-02-27 DIAGNOSIS — M6281 Muscle weakness (generalized): Secondary | ICD-10-CM | POA: Diagnosis not present

## 2020-02-27 DIAGNOSIS — M25512 Pain in left shoulder: Secondary | ICD-10-CM | POA: Diagnosis not present

## 2020-02-28 ENCOUNTER — Other Ambulatory Visit: Payer: Self-pay

## 2020-02-28 ENCOUNTER — Ambulatory Visit (INDEPENDENT_AMBULATORY_CARE_PROVIDER_SITE_OTHER): Payer: Medicare HMO

## 2020-02-28 ENCOUNTER — Ambulatory Visit: Payer: Medicare HMO | Admitting: Adult Health

## 2020-02-28 ENCOUNTER — Encounter: Payer: Self-pay | Admitting: Adult Health

## 2020-02-28 VITALS — BP 120/70 | HR 101 | Temp 97.4°F | Ht 65.0 in | Wt 279.4 lb

## 2020-02-28 DIAGNOSIS — G4733 Obstructive sleep apnea (adult) (pediatric): Secondary | ICD-10-CM | POA: Diagnosis not present

## 2020-02-28 DIAGNOSIS — J4530 Mild persistent asthma, uncomplicated: Secondary | ICD-10-CM | POA: Diagnosis not present

## 2020-02-28 DIAGNOSIS — R918 Other nonspecific abnormal finding of lung field: Secondary | ICD-10-CM

## 2020-02-28 NOTE — Progress Notes (Signed)
@Patient  ID: Sherry Riley, female    DOB: 10-08-1951, 69 y.o.   MRN: [id number]  Chief Complaint  Patient presents with  . Follow-up    Referring provider: Street, [place], *  HPI: 69 year old female former smoker seen for pulmonary consult during hospitalization 01/2020 for hypoxemia and lung mass.  TEST/EVENTS :  CT chest 4/1-dense left lingular consolidation overlying left hemidiaphragm, likely atelectasis.  Left lower lobe dense consolidation with air bronchograms sparing a central area with more normal lung tissue.  Mild airway thickening.  1 small pretracheal lymph node, otherwise no significant adenopathy.  No pleural effusions.  02/28/20 Follow up : Lung mass  Patient returns for a post hospital follow-up.  Patient was admitted for elective rotator cuff surgery February 07, 2020.  Postop she has some hypoxemia and abnormal chest x-ray with atelectasis in the left base.  CT chest showed a dense lingular consolidation with air bronchograms sparing a central area with more normal lung tissue.  She had a 40-pack-year smoking history, quit in 2011. During hospitalization she was also noted for snoring and episodes of witnessed apnea with concern of possible underlying sleep apnea.  She has some intermittent wheezing and shortness of breath with concerns of underlying COPD .  She has been seen by Dr. Neldon Mc in the past for asthma and allergies.  A previous spirometry showed FEV1 at 70%.  She is on Symbicort.  Takes this 1 puff daily-most days.  She was recommended for aggressive pulmonary hygiene with nebulizers flutter valve and incentive spirometer.  And mobilization as able.  A 2D echo showed EF 50 to 40%, grade 1 diastolic dysfunction normal right ventricle and right ventricle systolic function.  ABG showed compensated resp acidosis w/ hypercarbia  with pH of 7.43 PCO2 51 PO2 71 and bicarb 34 Patient was set up for a EBUS on April 13.  Bronchoscopy showed normal airways.  Cytology and  pathology was negative for malignancy.  Lymphocytes were present.  Flow cytology was sent , results are pending.  Path showed scant reactive bronchial epithelial cells no evidence of malignancy.  Cultures were negative. Since discharge patient says she is starting to feel better.  She is healing from her shoulder and is trying to move around a little bit more.  She says her breathing has improved she gets winded with heavy activity.  Has some occasional cough minimal congestion.  She denies any hemoptysis chest pain orthopnea. Patient has been set up for a sleep consult that is scheduled for 3 weeks with Dr. Annamaria Boots. Today in the office her chest x-ray is clear with significant improvement and aeration of the left base.    Allergies  Allergen Reactions  . Atorvastatin Other (See Comments)    Myalgia  . Lyrica [Pregabalin] Rash  . Actonel [Risedronate Sodium]   . Coreg [Carvedilol] Hives  . Hydralazine Hcl Other (See Comments) and Cough    Chest pain, dyspnea  . Ace Inhibitors Rash  . Diltiazem Hcl Itching and Rash  . Diovan Hct [Valsartan-Hydrochlorothiazide] Rash  . Valsartan Rash     There is no immunization history on file for this patient.  Past Medical History:  Diagnosis Date  . Asthma   . Complication of anesthesia    Hard to wake up.  . Depression   . Fibromyalgia   . GERD (gastroesophageal reflux disease)   . High blood pressure   . High cholesterol   . Pneumonia   . Pre-diabetes     Tobacco  History: Social History   Tobacco Use  Smoking Status Former Smoker  . Years: 36.00  . Types: Cigarettes  . Quit date: 2011  . Years since quitting: 10.3  Smokeless Tobacco Never Used  Tobacco Comment   one-half to one pack per day   Counseling given: Not Answered Comment: one-half to one pack per day   Outpatient Medications Prior to Visit  Medication Sig Dispense Refill  . albuterol (PROAIR HFA) 108 (90 Base) MCG/ACT inhaler Inhale 2 puffs into the lungs every 4  (four) hours as needed for wheezing or shortness of breath. Inhale two puffs every four to six hours as needed for breathing problems.     Marland Kitchen albuterol (PROVENTIL) (2.5 MG/3ML) 0.083% nebulizer solution Take 2.5 mg by nebulization every 4 (four) hours as needed for wheezing or shortness of breath.    . baclofen (LIORESAL) 10 MG tablet Take 1 tablet (10 mg total) by mouth 3 (three) times daily as needed for muscle spasms. 20 each 0  . budesonide-formoterol (SYMBICORT) 160-4.5 MCG/ACT inhaler Inhale 1 puff into the lungs 2 (two) times daily. 1 Inhaler 12  . Calcium-Magnesium-Vitamin D (CALCIUM MAGNESIUM PO) Take 1,000 mg by mouth 4 (four) times daily.     . chlorthalidone (HYGROTON) 25 MG tablet Take 25 mg by mouth daily.  3  . cloNIDine (CATAPRES) 0.1 MG tablet Take 1 tablet (0.1 mg total) by mouth 2 (two) times daily. 60 tablet 0  . famotidine (PEPCID) 40 MG tablet Take 40 mg by mouth daily.    . fluticasone (FLONASE) 50 MCG/ACT nasal spray Place 1 spray into both nostrils daily. 15 mL 0  . gabapentin (NEURONTIN) 800 MG tablet Take 800 mg by mouth 4 (four) times daily.   5  . ipratropium (ATROVENT) 0.02 % nebulizer solution Can use one vial in nebulizer every four to six hours as needed for cough or wheeze.  Mix with Albuterol. (Patient taking differently: Take 0.5 mg by nebulization every 2 (two) hours as needed for wheezing or shortness of breath. Can use one vial in nebulizer every four to six hours as needed for cough or wheeze.  Mix with Albuterol.) 150 mL 1  . ipratropium-albuterol (DUONEB) 0.5-2.5 (3) MG/3ML SOLN Take 3 mLs by nebulization 2 (two) times daily. 360 mL 0  . loratadine (CLARITIN) 10 MG tablet Take 10 mg by mouth daily.    Marland Kitchen LORazepam (ATIVAN) 0.5 MG tablet Take 0.5 mg by mouth 2 (two) times daily as needed (Depression).   2  . meclizine (ANTIVERT) 12.5 MG tablet Take 12.5 mg by mouth 2 (two) times daily as needed for dizziness.   1  . metroNIDAZOLE (METROCREAM) 0.75 % cream Apply 1  application topically daily as needed (acne).   30  . Misc Natural Products (OSTEO BI-FLEX JOINT SHIELD PO) Take 1 tablet by mouth in the morning and at bedtime.     . Omega-3 Fatty Acids (FISH OIL) 1200 MG CAPS Take 1,200 mg by mouth daily.     . ondansetron (ZOFRAN) 4 MG tablet Take 1 tablet (4 mg total) by mouth every 8 (eight) hours as needed for nausea or vomiting. 20 tablet 0  . potassium gluconate 595 (99 K) MG TABS tablet Take 595 mg by mouth daily.    . pravastatin (PRAVACHOL) 40 MG tablet Take 40 mg by mouth daily.     Marland Kitchen venlafaxine (EFFEXOR) 75 MG tablet Take 75 mg by mouth daily.     . Vitamin D3 (VITAMIN D) 25  MCG tablet Take 1,000 Units by mouth daily.     No facility-administered medications prior to visit.     Review of Systems:   Constitutional:   No  weight loss, night sweats,  Fevers, chills,  +fatigue, or  lassitude.  HEENT:   No headaches,  Difficulty swallowing,  Tooth/dental problems, or  Sore throat,                No sneezing, itching, ear ache, nasal congestion, post nasal drip,   CV:  No chest pain,  Orthopnea, PND, swelling in lower extremities, anasarca, dizziness, palpitations, syncope.   GI  No heartburn, indigestion, abdominal pain, nausea, vomiting, diarrhea, change in bowel habits, loss of appetite, bloody stools.   Resp:    No chest wall deformity  Skin: no rash or lesions.  GU: no dysuria, change in color of urine, no urgency or frequency.  No flank pain, no hematuria   MS:  No joint pain or swelling.  No decreased range of motion.  No back pain.    Physical Exam    GEN: A/Ox3; pleasant , NAD, BMI 46   HEENT:  [place]/AT,  , NOSE-clear, THROAT-clear, no lesions, no postnasal drip or exudate noted.  Class II-III MP airway  NECK:  Supple w/ fair ROM; no JVD; normal carotid impulses w/o bruits; no thyromegaly or nodules palpated; no lymphadenopathy.    RESP  Clear  P & A; w/o, wheezes/ rales/ or rhonchi. no accessory muscle use, no dullness to  percussion  CARD:  RRR, no m/r/g, tr  peripheral edema, pulses intact, no cyanosis or clubbing.  GI:   Soft & nt; nml bowel sounds; no organomegaly or masses detected.   Musco: Warm bil, left shoulder sling  Neuro: alert, no focal deficits noted.    Skin: Warm, no lesions or rashes    Lab Results:  CBC    Component Value Date/Time   WBC 11.0 (H) 02/11/2020 0326   RBC 4.23 02/11/2020 0326   HGB 12.9 02/11/2020 0326   HGB 14.5 01/07/2018 1116   HCT 40.2 02/11/2020 0326   HCT 42.2 01/07/2018 1116   PLT 247 02/11/2020 0326   PLT 267 01/07/2018 1116   MCV 95.0 02/11/2020 0326   MCV 89 01/07/2018 1116   MCH 30.5 02/11/2020 0326   MCHC 32.1 02/11/2020 0326   RDW 14.3 02/11/2020 0326   RDW 14.2 01/07/2018 1116   LYMPHSABS 3.7 02/11/2020 0326   LYMPHSABS 3.9 (H) 01/07/2018 1116   MONOABS 0.9 02/11/2020 0326   EOSABS 0.2 02/11/2020 0326   EOSABS 0.1 01/07/2018 1116   BASOSABS 0.1 02/11/2020 0326   BASOSABS 0.1 01/07/2018 1116    BMET    Component Value Date/Time   NA 140 02/11/2020 0326   K 4.2 02/11/2020 0326   CL 101 02/11/2020 0326   CO2 31 02/11/2020 0326   GLUCOSE 111 (H) 02/11/2020 0326   BUN 21 02/11/2020 0326   CREATININE 0.99 02/11/2020 0326   CALCIUM 8.9 02/11/2020 0326   GFRNONAA 59 (L) 02/11/2020 0326   GFRAA >60 02/11/2020 0326    BNP    Component Value Date/Time   BNP 120.1 (H) 02/09/2020 0255    ProBNP No results found for: PROBNP  Imaging: CT CHEST WO CONTRAST  Result Date: 02/09/2020 CLINICAL DATA:  Shortness of breath EXAM: CT CHEST WITHOUT CONTRAST TECHNIQUE: Multidetector CT imaging of the chest was performed following the standard protocol without IV contrast. COMPARISON:  Chest radiograph, 02/08/2020, 07/19/2013 FINDINGS: Cardiovascular: Aortic  atherosclerosis. Aortic valve calcifications. Normal heart size. Scattered coronary artery calcifications. No pericardial effusion. Mediastinum/Nodes: No enlarged mediastinal, hilar, or axillary  lymph nodes. Thyroid gland, trachea, and esophagus demonstrate no significant findings. Lungs/Pleura: Dense atelectasis or consolidation of the dependent left lower lobe and inferior lingula with associated elevation of the left hemidiaphragm. There are numerous fine centrilobular nodules of the bilateral lung apices. No pleural effusion or pneumothorax. Upper Abdomen: No acute abnormality.  Gallstones. Musculoskeletal: No chest wall mass or suspicious bone lesions identified. IMPRESSION: 1. Dense atelectasis or consolidation of the dependent left lower lobe and inferior lingula with associated elevation of the left hemidiaphragm, which may reflect chronic scarring/atelectasis or acute infectious consolidation, generally favor chronic findings, although this appearance is new compared to radiographs dated 2014. 2. Numerous fine centrilobular nodules of the bilateral lung apices, nonspecific and infectious or inflammatory, most commonly smoking-related respiratory bronchiolitis. 3.  Coronary artery disease. 4. Aortic valve calcifications. Aortic Atherosclerosis (ICD10-I70.0). 5.  Cholelithiasis. Electronically Signed   By: Eddie Candle M.D.   On: 02/09/2020 16:27   DG CHEST PORT 1 VIEW  Result Date: 02/21/2020 CLINICAL DATA:  Post bronchoscopy EXAM: PORTABLE CHEST 1 VIEW COMPARISON:  02/08/2020 FINDINGS: Elevated/eventrated right diaphragm, also suggested on prior CT 02/09/2020. Normal heart size and stable mediastinal contours. There is no edema, consolidation, effusion, or pneumothorax. IMPRESSION: No acute finding after bronchoscopy. Electronically Signed   By: Monte Fantasia M.D.   On: 02/21/2020 10:27   DG Chest Port 1 View  Result Date: 02/08/2020 CLINICAL DATA:  Low O2 saturation. EXAM: PORTABLE CHEST 1 VIEW COMPARISON:  Chest x-ray dated 07/19/2013 FINDINGS: There is slight atelectasis at the left lung base. The lungs are otherwise clear. Heart size and vascularity are normal. No acute bone  abnormality. IMPRESSION: Slight atelectasis at the left lung base. Electronically Signed   By: Lorriane Shire M.D.   On: 02/08/2020 13:58   ECHOCARDIOGRAM COMPLETE  Result Date: 02/10/2020    ECHOCARDIOGRAM REPORT   Patient Name:   Sherry Riley Date of Exam: 02/10/2020 Medical Rec #:  [id number]       Height:       65.0 in Accession #:    [id number]      Weight:       286.1 lb Date of Birth:  1951/04/28        BSA:          2.303 m Patient Age:    20 years        BP:           145/84 mmHg Patient Gender: F               HR:           70 bpm. Exam Location:  Inpatient Procedure: 2D Echo Indications:    Acute respiratory failure with hypoxia ([place]) [[id number]]  History:        Patient has no prior history of Echocardiogram examinations.                 Risk Factors:Hypertension, Dyslipidemia, Prediabetes and Former                 Smoker. Left rotator cuff tear.  Sonographer:    Darlina Sicilian RDCS Referring Phys: [id number] [place]  Sonographer Comments: Technically challenging study due to left rotator cuff tear. IMPRESSIONS  1. Technically difficult; normal LV systolic function; mild LVE; grade 1 diastolic dysfunction; trace AI.  2. Left ventricular ejection fraction,  by estimation, is 50 to 55%. The left ventricle has low normal function. The left ventricle has no regional wall motion abnormalities. The left ventricular internal cavity size was mildly dilated. Left ventricular diastolic parameters are consistent with Grade I diastolic dysfunction (impaired relaxation).  3. Right ventricular systolic function is normal. The right ventricular size is normal. Tricuspid regurgitation signal is inadequate for assessing PA pressure.  4. The mitral valve is normal in structure. Trivial mitral valve regurgitation. No evidence of mitral stenosis.  5. The aortic valve is normal in structure. Aortic valve regurgitation is trivial. No aortic stenosis is present.  6. The inferior vena cava is dilated in size with <50%  respiratory variability, suggesting right atrial pressure of 15 mmHg. FINDINGS  Left Ventricle: Left ventricular ejection fraction, by estimation, is 50 to 55%. The left ventricle has low normal function. The left ventricle has no regional wall motion abnormalities. The left ventricular internal cavity size was mildly dilated. There is no left ventricular hypertrophy. Left ventricular diastolic parameters are consistent with Grade I diastolic dysfunction (impaired relaxation). Right Ventricle: The right ventricular size is normal.Right ventricular systolic function is normal. Tricuspid regurgitation signal is inadequate for assessing PA pressure. The tricuspid regurgitant velocity is 1.60 m/s, and with an assumed right atrial pressure of 15 mmHg, the estimated right ventricular systolic pressure is 32.2 mmHg. Left Atrium: Left atrial size was normal in size. Right Atrium: Right atrial size was normal in size. Pericardium: There is no evidence of pericardial effusion. Mitral Valve: The mitral valve is normal in structure. Normal mobility of the mitral valve leaflets. Mild mitral annular calcification. Trivial mitral valve regurgitation. No evidence of mitral valve stenosis. Tricuspid Valve: The tricuspid valve is normal in structure. Tricuspid valve regurgitation is trivial. No evidence of tricuspid stenosis. Aortic Valve: The aortic valve is normal in structure. Aortic valve regurgitation is trivial. No aortic stenosis is present. Pulmonic Valve: The pulmonic valve was not well visualized. Pulmonic valve regurgitation is not visualized. No evidence of pulmonic stenosis. Aorta: The aortic root is normal in size and structure. Venous: The inferior vena cava is dilated in size with less than 50% respiratory variability, suggesting right atrial pressure of 15 mmHg.  Additional Comments: Technically difficult; normal LV systolic function; mild LVE; grade 1 diastolic dysfunction; trace AI.  LEFT VENTRICLE PLAX 2D LVIDd:          5.30 cm  Diastology LVIDs:         3.60 cm  LV e' lateral:   0.06 cm/s LV PW:         0.70 cm  LV E/e' lateral: 12.0 LV IVS:        0.90 cm  LV e' medial:    0.06 cm/s LVOT diam:     2.00 cm  LV E/e' medial:  12.5 LV SV:         68 LV SV Index:   30 LVOT Area:     3.14 cm  RIGHT VENTRICLE RV S prime:     9.03 cm/s TAPSE (M-mode): 1.7 cm LEFT ATRIUM             Index LA diam:        3.60 cm 1.56 cm/m LA Vol (A2C):   38.1 ml 16.54 ml/m LA Vol (A4C):   33.8 ml 14.68 ml/m LA Biplane Vol: 35.8 ml 15.54 ml/m  AORTIC VALVE LVOT Vmax:   90.80 cm/s LVOT Vmean:  60.400 cm/s LVOT VTI:    0.218 m  AORTA Ao Root diam: 2.70 cm MITRAL VALVE               TRICUSPID VALVE MV Area (PHT): 4.49 cm    TR Peak grad:   10.2 mmHg MV Decel Time: 169 msec    TR Vmax:        160.00 cm/s MV E velocity: 0.72 cm/s MV A velocity: 93.00 cm/s  SHUNTS MV E/A ratio:  0.01        Systemic VTI:  0.22 m                            Systemic Diam: 2.00 cm Kirk Ruths MD Electronically signed by Kirk Ruths MD Signature Date/Time: 02/10/2020/1:54:11 PM    Final    DG C-ARM BRONCHOSCOPY  Result Date: 02/21/2020 C-ARM BRONCHOSCOPY: Fluoroscopy was utilized by the requesting physician.  No radiographic interpretation.      No flowsheet data found.  No results found for: NITRICOXIDE      Assessment & Plan:   No problem-specific Assessment & Plan notes found for this encounter.     Rexene Edison, NP 02/28/2020

## 2020-02-28 NOTE — Patient Instructions (Addendum)
Chest xray today .  Mucinex DM Twice daily  As needed  Cough/congestion .  Increase Symbicort 2 puffs Twice daily  , rinse after use.  Follow up in 4-6 weeks with Dr. Carlis Abbott  with PFT  Follow up for Sleep consult next month as planned with Dr. Annamaria Boots.

## 2020-02-29 ENCOUNTER — Telehealth: Payer: Self-pay | Admitting: Adult Health

## 2020-02-29 DIAGNOSIS — R0602 Shortness of breath: Secondary | ICD-10-CM

## 2020-02-29 DIAGNOSIS — R918 Other nonspecific abnormal finding of lung field: Secondary | ICD-10-CM | POA: Insufficient documentation

## 2020-02-29 DIAGNOSIS — J189 Pneumonia, unspecified organism: Secondary | ICD-10-CM

## 2020-02-29 DIAGNOSIS — M25512 Pain in left shoulder: Secondary | ICD-10-CM | POA: Diagnosis not present

## 2020-02-29 DIAGNOSIS — M6281 Muscle weakness (generalized): Secondary | ICD-10-CM | POA: Diagnosis not present

## 2020-02-29 DIAGNOSIS — J45909 Unspecified asthma, uncomplicated: Secondary | ICD-10-CM | POA: Insufficient documentation

## 2020-02-29 DIAGNOSIS — M25612 Stiffness of left shoulder, not elsewhere classified: Secondary | ICD-10-CM | POA: Diagnosis not present

## 2020-02-29 HISTORY — DX: Pneumonia, unspecified organism: J18.9

## 2020-02-29 NOTE — Assessment & Plan Note (Signed)
History of asthma and allergies currently on Symbicort daily. Suspect patient may have a component of underlying COPD as she has a strong smoking history.  She needs formal full PFTs.  Would increase Symbicort to 2 puffs twice daily.  Add Mucinex as needed.  Plan  Patient Instructions  Chest xray today .  Mucinex DM Twice daily  As needed  Cough/congestion .  Increase Symbicort 2 puffs Twice daily  , rinse after use.  Follow up in 4-6 weeks with Dr. Carlis Abbott  with PFT  Follow up for Sleep consult next month as planned with Dr. Annamaria Boots.

## 2020-02-29 NOTE — Telephone Encounter (Signed)
I called [name] and made her aware of chest x-ray results.

## 2020-02-29 NOTE — Assessment & Plan Note (Signed)
Suspected underlying OSA and OHS with positive daytime sleepiness, snoring and witnessed apneic events.  She has an upcoming sleep consult.  Would follow-up according with Dr. Annamaria Boots for work-up.

## 2020-02-29 NOTE — Assessment & Plan Note (Signed)
Abnormal chest x-ray and CT chest showing left lower lobe and lingular consolidation concerning for possible lung mass and patient with heavy smoking history-notable in the postop setting.  Reviewed EBUS pathology, cytology and culture data with patient and family member.  Results are nondiagnostic.  Chest x-ray today shows resolution of left lower lobe atelectasis and consolidation.  She has significantly improved aeration.  For now we will repeat CT chest in 3 months to follow for complete resolution.  She has high risk with her heavy smoking history.  Would continue with pulmonary hygiene regimen and mobilization.

## 2020-03-05 DIAGNOSIS — M25512 Pain in left shoulder: Secondary | ICD-10-CM | POA: Diagnosis not present

## 2020-03-05 DIAGNOSIS — M6281 Muscle weakness (generalized): Secondary | ICD-10-CM | POA: Diagnosis not present

## 2020-03-05 DIAGNOSIS — M25612 Stiffness of left shoulder, not elsewhere classified: Secondary | ICD-10-CM | POA: Diagnosis not present

## 2020-03-07 DIAGNOSIS — M6281 Muscle weakness (generalized): Secondary | ICD-10-CM | POA: Diagnosis not present

## 2020-03-07 DIAGNOSIS — M25612 Stiffness of left shoulder, not elsewhere classified: Secondary | ICD-10-CM | POA: Diagnosis not present

## 2020-03-07 DIAGNOSIS — M25512 Pain in left shoulder: Secondary | ICD-10-CM | POA: Diagnosis not present

## 2020-03-12 DIAGNOSIS — M25612 Stiffness of left shoulder, not elsewhere classified: Secondary | ICD-10-CM | POA: Diagnosis not present

## 2020-03-12 DIAGNOSIS — M6281 Muscle weakness (generalized): Secondary | ICD-10-CM | POA: Diagnosis not present

## 2020-03-12 DIAGNOSIS — M25512 Pain in left shoulder: Secondary | ICD-10-CM | POA: Diagnosis not present

## 2020-03-14 DIAGNOSIS — M25512 Pain in left shoulder: Secondary | ICD-10-CM | POA: Diagnosis not present

## 2020-03-14 DIAGNOSIS — M25612 Stiffness of left shoulder, not elsewhere classified: Secondary | ICD-10-CM | POA: Diagnosis not present

## 2020-03-14 DIAGNOSIS — M6281 Muscle weakness (generalized): Secondary | ICD-10-CM | POA: Diagnosis not present

## 2020-03-15 DIAGNOSIS — M79662 Pain in left lower leg: Secondary | ICD-10-CM | POA: Diagnosis not present

## 2020-03-15 DIAGNOSIS — M79605 Pain in left leg: Secondary | ICD-10-CM | POA: Diagnosis not present

## 2020-03-15 DIAGNOSIS — Z7901 Long term (current) use of anticoagulants: Secondary | ICD-10-CM | POA: Diagnosis not present

## 2020-03-16 DIAGNOSIS — M25562 Pain in left knee: Secondary | ICD-10-CM | POA: Diagnosis not present

## 2020-03-19 ENCOUNTER — Ambulatory Visit: Payer: Medicare HMO | Admitting: Internal Medicine

## 2020-03-19 ENCOUNTER — Encounter: Payer: Self-pay | Admitting: Internal Medicine

## 2020-03-19 ENCOUNTER — Other Ambulatory Visit: Payer: Self-pay

## 2020-03-19 VITALS — BP 118/74 | HR 80 | Temp 97.3°F | Ht 65.0 in | Wt 285.0 lb

## 2020-03-19 DIAGNOSIS — M25612 Stiffness of left shoulder, not elsewhere classified: Secondary | ICD-10-CM | POA: Diagnosis not present

## 2020-03-19 DIAGNOSIS — M25512 Pain in left shoulder: Secondary | ICD-10-CM | POA: Diagnosis not present

## 2020-03-19 DIAGNOSIS — M6281 Muscle weakness (generalized): Secondary | ICD-10-CM | POA: Diagnosis not present

## 2020-03-19 DIAGNOSIS — G4733 Obstructive sleep apnea (adult) (pediatric): Secondary | ICD-10-CM | POA: Diagnosis not present

## 2020-03-19 NOTE — Assessment & Plan Note (Signed)
Obesity impacting other health issues.  Plan- emphasizing importance of real effort to normalize weight

## 2020-03-19 NOTE — Assessment & Plan Note (Signed)
High probability.based on history and exam. Appropriate educational discussion including importance of body weight and driving safety, testing, treatment options and medical concerns.  Plan- Sleep study, then probably CPAP

## 2020-03-19 NOTE — Progress Notes (Signed)
03/19/20- 17 yoF former smoker referred by Rexene Edison, NP for sleep evaluation with concern of possible OSA. Medical problem list includes CHF, Allergic Rhinitis, Asthma, OHS, Morbid Obesisty,  Had office f/u 4/20 after hosp for rotator cuff repair, when she was noted to have possible lung mass and smoking changes. Noted in hosp to be snoring with witnessed apnea. General pulmonary f/u with Dr Carlis Abbott and PFT pending. Epworth score 17 Body weight today 285 lbs      Sister here Specific issue for me today is possible sleep apnea. OSA was dx'd by sleep study at [facility] over 10 years ago, but she couldn't afford CPAP so nothing was done. A brother and son use CPAP.  Sister lives with her and describes snoring. Sleep talking and frequent position changes. No more complex parasomnias. Admits daytime sleepiness.  No sleep med. 1-2 cups AM coffee.  Denies ENT surgery, neurologic or thyroid disease. Bedtime 10PM, takes 1-2 hrs to fall asleep. Wakes 3-4 times before finally up around 9AM.  Prior to Admission medications   Medication Sig Start Date End Date Taking? Authorizing Provider  albuterol (PROAIR HFA) 108 (90 Base) MCG/ACT inhaler Inhale 2 puffs into the lungs every 4 (four) hours as needed for wheezing or shortness of breath. Inhale two puffs every four to six hours as needed for breathing problems.    Yes [provider]  albuterol (PROVENTIL) (2.5 MG/3ML) 0.083% nebulizer solution Take 2.5 mg by nebulization every 4 (four) hours as needed for wheezing or shortness of breath.   Yes [provider]  baclofen (LIORESAL) 10 MG tablet Take 1 tablet (10 mg total) by mouth 3 (three) times daily as needed for muscle spasms. 02/07/20  Yes Martensen, Charna Elizabeth III, PA-C  budesonide-formoterol (SYMBICORT) 160-4.5 MCG/ACT inhaler Inhale 1 puff into the lungs 2 (two) times daily. 02/09/20  Yes Martensen, Charna Elizabeth III, PA-C  Calcium-Magnesium-Vitamin D (CALCIUM MAGNESIUM PO) Take  1,000 mg by mouth 4 (four) times daily.    Yes [provider]  chlorthalidone (HYGROTON) 25 MG tablet Take 25 mg by mouth daily. 10/16/17  Yes [provider]  cloNIDine (CATAPRES) 0.1 MG tablet Take 1 tablet (0.1 mg total) by mouth 2 (two) times daily. 02/11/20  Yes Ainsley Spinner, PA-C  famotidine (PEPCID) 40 MG tablet Take 40 mg by mouth daily. 09/29/19  Yes [provider]  gabapentin (NEURONTIN) 800 MG tablet Take 800 mg by mouth 4 (four) times daily.  10/16/17  Yes [provider]  ipratropium (ATROVENT) 0.02 % nebulizer solution Can use one vial in nebulizer every four to six hours as needed for cough or wheeze.  Mix with Albuterol. Patient taking differently: Take 0.5 mg by nebulization every 2 (two) hours as needed for wheezing or shortness of breath. Can use one vial in nebulizer every four to six hours as needed for cough or wheeze.  Mix with Albuterol. 01/07/18  Yes Kozlow, Donnamarie Poag, MD  ipratropium-albuterol (DUONEB) 0.5-2.5 (3) MG/3ML SOLN Take 3 mLs by nebulization 2 (two) times daily. 02/11/20 04/11/20 Yes Ainsley Spinner, PA-C  loratadine (CLARITIN) 10 MG tablet Take 10 mg by mouth daily.   Yes [provider]  LORazepam (ATIVAN) 0.5 MG tablet Take 0.5 mg by mouth 2 (two) times daily as needed (Depression).  12/25/17  Yes [provider]  meclizine (ANTIVERT) 12.5 MG tablet Take 12.5 mg by mouth 2 (two) times daily as needed for dizziness.  10/12/17  Yes [provider]  metroNIDAZOLE (METROCREAM) 0.75 %  cream Apply 1 application topically daily as needed (acne).  12/29/17  Yes [provider]  Misc Natural Products (OSTEO BI-FLEX JOINT SHIELD PO) Take 1 tablet by mouth in the morning and at bedtime.    Yes [provider]  Omega-3 Fatty Acids (FISH OIL) 1200 MG CAPS Take 1,200 mg by mouth daily.    Yes [provider]  ondansetron (ZOFRAN) 4 MG tablet Take 1 tablet (4 mg total) by mouth every 8 (eight) hours as  needed for nausea or vomiting. 02/07/20  Yes Martensen, Charna Elizabeth III, PA-C  potassium gluconate 595 (99 K) MG TABS tablet Take 595 mg by mouth daily. 09/12/19  Yes [provider]  pravastatin (PRAVACHOL) 40 MG tablet Take 40 mg by mouth daily.  12/31/17  Yes [provider]  venlafaxine (EFFEXOR) 75 MG tablet Take 75 mg by mouth daily.    Yes [provider]  Vitamin D3 (VITAMIN D) 25 MCG tablet Take 1,000 Units by mouth daily.   Yes [provider]  fluticasone (FLONASE) 50 MCG/ACT nasal spray Place 1 spray into both nostrils daily. 02/09/20 03/10/20  Sindy Messing, PA-C   Past Medical History:  Diagnosis Date  . Asthma   . Complication of anesthesia    Hard to wake up.  . Depression   . Fibromyalgia   . GERD (gastroesophageal reflux disease)   . High blood pressure   . High cholesterol   . Pneumonia   . Pre-diabetes    Past Surgical History:  Procedure Laterality Date  . ARTHROSCOPY KNEE W/ DRILLING Right   . BIOPSY  02/21/2020   Procedure: BIOPSY;  Surgeon: Julian Hy, DO;  Location: [place];  Service: Cardiopulmonary;;  . CYST EXCISION  2018   Back cyst  . ENDOBRONCHIAL ULTRASOUND  02/21/2020   Procedure: ENDOBRONCHIAL ULTRASOUND;  Surgeon: Julian Hy, DO;  Location: [place];  Service: Cardiopulmonary;;  . FINE NEEDLE ASPIRATION  02/21/2020   Procedure: FINE NEEDLE ASPIRATION (FNA) LINEAR;  Surgeon: Julian Hy, DO;  Location: [facility];  Service: Cardiopulmonary;;  . HERNIA REPAIR  2015  . SHOULDER ARTHROSCOPY WITH BANKART REPAIR Left 02/07/2020   Procedure: SHOULDER ARTHROSCOPY WITH BANKART REPAIR;  Surgeon: Renette Butters, MD;  Location: [facility];  Service: Orthopedics;  Laterality: Left;  Marland Kitchen VIDEO BRONCHOSCOPY  02/21/2020   Procedure: VIDEO BRONCHOSCOPY WITH FLUORO;  Surgeon: Julian Hy, DO;  Location: [facility];  Service: Cardiopulmonary;;   Family History  Problem Relation Age of Onset  . Asthma  Mother   . Heart attack Mother   . Lung cancer Father   . Diabetes Brother    Social History   Socioeconomic History  . Marital status: Single    Spouse name: Not on file  . Number of children: Not on file  . Years of education: Not on file  . Highest education level: Not on file  Occupational History  . Not on file  Tobacco Use  . Smoking status: Former Smoker    Years: 36.00    Types: Cigarettes    Quit date: 2011    Years since quitting: 10.3  . Smokeless tobacco: Never Used  . Tobacco comment: one-half to one pack per day  Substance and Sexual Activity  . Alcohol use: No  . Drug use: No  . Sexual activity: Not on file  Other Topics Concern  . Not on file  Social History Narrative  . Not on file   Social  Determinants of Health   Financial Resource Strain:   . Difficulty of Paying Living Expenses:   Food Insecurity:   . Worried About Charity fundraiser in the Last Year:   . Arboriculturist in the Last Year:   Transportation Needs:   . Film/video editor (Medical):   Marland Kitchen Lack of Transportation (Non-Medical):   Physical Activity:   . Days of Exercise per Week:   . Minutes of Exercise per Session:   Stress:   . Feeling of Stress :   Social Connections:   . Frequency of Communication with Friends and Family:   . Frequency of Social Gatherings with Friends and Family:   . Attends Religious Services:   . Active Member of Clubs or Organizations:   . Attends Archivist Meetings:   Marland Kitchen Marital Status:   Intimate Partner Violence:   . Fear of Current or Ex-Partner:   . Emotionally Abused:   Marland Kitchen Physically Abused:   . Sexually Abused:    ROS-see HPI   + = positive Constitutional:    weight loss, night sweats, fevers, chills, fatigue, lassitude. HEENT:   + headaches, difficulty swallowing,+ tooth/dental problems, sore throat,       sneezing, itching, ear ache, nasal congestion, post nasal drip, snoring CV:    chest pain, orthopnea, PND, +swelling in  lower extremities, anasarca,                                  dizziness, palpitations Resp:   +shortness of breath with exertion or at rest.                +productive  non-productive cough, coughing up of blood.              change in color of mucus.  wheezing.   Skin:    rash or lesions. GI:  +heartburn, indigestion, abdominal pain, nausea, vomiting, diarrhea,                 change in bowel habits, loss of appetite GU: dysuria, change in color of urine, no urgency or frequency.   flank pain. MS:   +joint pain, stiffness, decreased range of motion, back pain. Neuro-     nothing unusual Psych:  change in mood or affect.  depression or +anxiety.   memory loss.  OBJ- Physical Exam General- Alert, Oriented, Affect-appropriate, Distress- none acute, + morbidly obese Skin- rash-none, lesions- none, excoriation- none Lymphadenopathy- none Head- atraumatic            Eyes- Gross vision intact, PERRLA, conjunctivae and secretions clear            Ears- Hearing, canals-normal            Nose- Clear, no-Septal dev, mucus, polyps, erosion, perforation             Throat- Mallampati II-III, mucosa clear , drainage- none, tonsils- atrophic Neck- flexible , trachea midline, no stridor , thyroid nl, carotid no bruit Chest - symmetrical excursion , unlabored           Heart/CV- RRR , no murmur , no gallop  , no rub, nl s1 s2                           - JVD- none , edema- none, stasis changes- none, varices- none  Lung- clear to P&A, wheeze- none, cough- none , dullness-none, rub- none           Chest wall-  Abd-  Br/ Gen/ Rectal- Not done, not indicated Extrem- cyanosis- none, clubbing, none, atrophy- none, strength- nl Neuro- grossly intact to observation

## 2020-03-19 NOTE — Patient Instructions (Signed)
Order- schedule home sleep test   Dx OSA  Please call us about 2 weeks after your sleep test to see if results and recommendations are ready yet. If appropriate, we may be able to start treatment before I see you next.

## 2020-03-21 DIAGNOSIS — M25612 Stiffness of left shoulder, not elsewhere classified: Secondary | ICD-10-CM | POA: Diagnosis not present

## 2020-03-21 DIAGNOSIS — M25512 Pain in left shoulder: Secondary | ICD-10-CM | POA: Diagnosis not present

## 2020-03-21 DIAGNOSIS — M6281 Muscle weakness (generalized): Secondary | ICD-10-CM | POA: Diagnosis not present

## 2020-03-26 DIAGNOSIS — M6281 Muscle weakness (generalized): Secondary | ICD-10-CM | POA: Diagnosis not present

## 2020-03-26 DIAGNOSIS — M25612 Stiffness of left shoulder, not elsewhere classified: Secondary | ICD-10-CM | POA: Diagnosis not present

## 2020-03-26 DIAGNOSIS — M25512 Pain in left shoulder: Secondary | ICD-10-CM | POA: Diagnosis not present

## 2020-03-28 DIAGNOSIS — M25512 Pain in left shoulder: Secondary | ICD-10-CM | POA: Diagnosis not present

## 2020-03-28 DIAGNOSIS — M25612 Stiffness of left shoulder, not elsewhere classified: Secondary | ICD-10-CM | POA: Diagnosis not present

## 2020-03-28 DIAGNOSIS — M6281 Muscle weakness (generalized): Secondary | ICD-10-CM | POA: Diagnosis not present

## 2020-04-02 DIAGNOSIS — M25612 Stiffness of left shoulder, not elsewhere classified: Secondary | ICD-10-CM | POA: Diagnosis not present

## 2020-04-02 DIAGNOSIS — M25512 Pain in left shoulder: Secondary | ICD-10-CM | POA: Diagnosis not present

## 2020-04-02 DIAGNOSIS — M6281 Muscle weakness (generalized): Secondary | ICD-10-CM | POA: Diagnosis not present

## 2020-04-04 DIAGNOSIS — M25512 Pain in left shoulder: Secondary | ICD-10-CM | POA: Diagnosis not present

## 2020-04-04 DIAGNOSIS — M25612 Stiffness of left shoulder, not elsewhere classified: Secondary | ICD-10-CM | POA: Diagnosis not present

## 2020-04-04 DIAGNOSIS — M6281 Muscle weakness (generalized): Secondary | ICD-10-CM | POA: Diagnosis not present

## 2020-04-07 ENCOUNTER — Other Ambulatory Visit (HOSPITAL_COMMUNITY): Payer: Medicare HMO

## 2020-04-11 ENCOUNTER — Telehealth: Payer: Self-pay | Admitting: Internal Medicine

## 2020-04-11 ENCOUNTER — Other Ambulatory Visit: Payer: Self-pay | Admitting: *Deleted

## 2020-04-11 DIAGNOSIS — R0602 Shortness of breath: Secondary | ICD-10-CM

## 2020-04-11 NOTE — Telephone Encounter (Signed)
Pt aware we do not have an available HST machine for her tomorrow.  We just rec'd her authorization yesterday for the HST.  Pt will schedule a time to pick up a HST machine while she is in the office tomorrow.  Nothing further needed at this time.

## 2020-04-12 ENCOUNTER — Ambulatory Visit: Payer: Medicare HMO | Admitting: Critical Care Medicine

## 2020-04-12 NOTE — Progress Notes (Deleted)
Synopsis: Referred in April 2021 for abnormal chest CT, hypoxia by Street, [place], *  Subjective:   PATIENT ID: Sherry Riley GENDER: female DOB: 23-Dec-1950, MRN: [id number]  No chief complaint on file.   HPI  HSAT soon Saw Dr. Annamaria Boots 5/10   Needs PFTs Symbicort 2 puffs twice daily Mucinex DM twice daily   No results found for: NITRICOXIDE  Past Medical History:  Diagnosis Date  . Asthma   . Complication of anesthesia    Hard to wake up.  . Depression   . Fibromyalgia   . GERD (gastroesophageal reflux disease)   . High blood pressure   . High cholesterol   . Pneumonia   . Pre-diabetes      Family History  Problem Relation Age of Onset  . Asthma Mother   . Heart attack Mother   . Lung cancer Father   . Diabetes Brother      Past Surgical History:  Procedure Laterality Date  . ARTHROSCOPY KNEE W/ DRILLING Right   . BIOPSY  02/21/2020   Procedure: BIOPSY;  Surgeon: Julian Hy, DO;  Location: [place];  Service: Cardiopulmonary;;  . CYST EXCISION  2018   Back cyst  . ENDOBRONCHIAL ULTRASOUND  02/21/2020   Procedure: ENDOBRONCHIAL ULTRASOUND;  Surgeon: Julian Hy, DO;  Location: [place];  Service: Cardiopulmonary;;  . FINE NEEDLE ASPIRATION  02/21/2020   Procedure: FINE NEEDLE ASPIRATION (FNA) LINEAR;  Surgeon: Julian Hy, DO;  Location: [facility];  Service: Cardiopulmonary;;  . HERNIA REPAIR  2015  . SHOULDER ARTHROSCOPY WITH BANKART REPAIR Left 02/07/2020   Procedure: SHOULDER ARTHROSCOPY WITH BANKART REPAIR;  Surgeon: Renette Butters, MD;  Location: [facility];  Service: Orthopedics;  Laterality: Left;  Marland Kitchen VIDEO BRONCHOSCOPY  02/21/2020   Procedure: VIDEO BRONCHOSCOPY WITH FLUORO;  Surgeon: Julian Hy, DO;  Location: [facility];  Service: Cardiopulmonary;;    Social History   Socioeconomic History  . Marital status: Single    Spouse name: Not on file  . Number of children: Not on file  . Years of education: Not on file    . Highest education level: Not on file  Occupational History  . Not on file  Tobacco Use  . Smoking status: Former Smoker    Years: 36.00    Types: Cigarettes    Quit date: 2011    Years since quitting: 10.4  . Smokeless tobacco: Never Used  . Tobacco comment: one-half to one pack per day  Substance and Sexual Activity  . Alcohol use: No  . Drug use: No  . Sexual activity: Not on file  Other Topics Concern  . Not on file  Social History Narrative  . Not on file   Social Determinants of Health   Financial Resource Strain:   . Difficulty of Paying Living Expenses:   Food Insecurity:   . Worried About Charity fundraiser in the Last Year:   . Arboriculturist in the Last Year:   Transportation Needs:   . Film/video editor (Medical):   Marland Kitchen Lack of Transportation (Non-Medical):   Physical Activity:   . Days of Exercise per Week:   . Minutes of Exercise per Session:   Stress:   . Feeling of Stress :   Social Connections:   . Frequency of Communication with Friends and Family:   . Frequency of Social Gatherings with Friends and Family:   . Attends Religious Services:   . Active  Member of Clubs or Organizations:   . Attends Archivist Meetings:   Marland Kitchen Marital Status:   Intimate Partner Violence:   . Fear of Current or Ex-Partner:   . Emotionally Abused:   Marland Kitchen Physically Abused:   . Sexually Abused:      Allergies  Allergen Reactions  . Atorvastatin Other (See Comments)    Myalgia  . Lyrica [Pregabalin] Rash  . Actonel [Risedronate Sodium]   . Coreg [Carvedilol] Hives  . Hydralazine Hcl Other (See Comments) and Cough    Chest pain, dyspnea  . Ace Inhibitors Rash  . Diltiazem Hcl Itching and Rash  . Diovan Hct [Valsartan-Hydrochlorothiazide] Rash  . Valsartan Rash     Immunization History  Administered Date(s) Administered  . Influenza, High Dose Seasonal PF 08/11/2019  . Moderna SARS-COVID-2 Vaccination 01/12/2020, 02/23/2020    Outpatient  Medications Prior to Visit  Medication Sig Dispense Refill  . albuterol (PROAIR HFA) 108 (90 Base) MCG/ACT inhaler Inhale 2 puffs into the lungs every 4 (four) hours as needed for wheezing or shortness of breath. Inhale two puffs every four to six hours as needed for breathing problems.     Marland Kitchen albuterol (PROVENTIL) (2.5 MG/3ML) 0.083% nebulizer solution Take 2.5 mg by nebulization every 4 (four) hours as needed for wheezing or shortness of breath.    . baclofen (LIORESAL) 10 MG tablet Take 1 tablet (10 mg total) by mouth 3 (three) times daily as needed for muscle spasms. 20 each 0  . budesonide-formoterol (SYMBICORT) 160-4.5 MCG/ACT inhaler Inhale 1 puff into the lungs 2 (two) times daily. 1 Inhaler 12  . Calcium-Magnesium-Vitamin D (CALCIUM MAGNESIUM PO) Take 1,000 mg by mouth 4 (four) times daily.     . chlorthalidone (HYGROTON) 25 MG tablet Take 25 mg by mouth daily.  3  . cloNIDine (CATAPRES) 0.1 MG tablet Take 1 tablet (0.1 mg total) by mouth 2 (two) times daily. 60 tablet 0  . famotidine (PEPCID) 40 MG tablet Take 40 mg by mouth daily.    . fluticasone (FLONASE) 50 MCG/ACT nasal spray Place 1 spray into both nostrils daily. 15 mL 0  . gabapentin (NEURONTIN) 800 MG tablet Take 800 mg by mouth 4 (four) times daily.   5  . ipratropium (ATROVENT) 0.02 % nebulizer solution Can use one vial in nebulizer every four to six hours as needed for cough or wheeze.  Mix with Albuterol. (Patient taking differently: Take 0.5 mg by nebulization every 2 (two) hours as needed for wheezing or shortness of breath. Can use one vial in nebulizer every four to six hours as needed for cough or wheeze.  Mix with Albuterol.) 150 mL 1  . ipratropium-albuterol (DUONEB) 0.5-2.5 (3) MG/3ML SOLN Take 3 mLs by nebulization 2 (two) times daily. 360 mL 0  . loratadine (CLARITIN) 10 MG tablet Take 10 mg by mouth daily.    Marland Kitchen LORazepam (ATIVAN) 0.5 MG tablet Take 0.5 mg by mouth 2 (two) times daily as needed (Depression).   2  .  meclizine (ANTIVERT) 12.5 MG tablet Take 12.5 mg by mouth 2 (two) times daily as needed for dizziness.   1  . metroNIDAZOLE (METROCREAM) 0.75 % cream Apply 1 application topically daily as needed (acne).   30  . Misc Natural Products (OSTEO BI-FLEX JOINT SHIELD PO) Take 1 tablet by mouth in the morning and at bedtime.     . Omega-3 Fatty Acids (FISH OIL) 1200 MG CAPS Take 1,200 mg by mouth daily.     Marland Kitchen  ondansetron (ZOFRAN) 4 MG tablet Take 1 tablet (4 mg total) by mouth every 8 (eight) hours as needed for nausea or vomiting. 20 tablet 0  . potassium gluconate 595 (99 K) MG TABS tablet Take 595 mg by mouth daily.    . pravastatin (PRAVACHOL) 40 MG tablet Take 40 mg by mouth daily.     Marland Kitchen venlafaxine (EFFEXOR) 75 MG tablet Take 75 mg by mouth daily.     . Vitamin D3 (VITAMIN D) 25 MCG tablet Take 1,000 Units by mouth daily.     No facility-administered medications prior to visit.    ROS   Objective:  There were no vitals filed for this visit.   on *** LPM *** RA BMI Readings from Last 3 Encounters:  03/19/20 47.43 kg/m  02/28/20 46.49 kg/m  02/21/20 45.60 kg/m   Wt Readings from Last 3 Encounters:  03/19/20 285 lb (129.3 kg)  02/28/20 279 lb 6.4 oz (126.7 kg)  02/21/20 274 lb (124.3 kg)    Physical Exam   CBC    Component Value Date/Time   WBC 11.0 (H) 02/11/2020 0326   RBC 4.23 02/11/2020 0326   HGB 12.9 02/11/2020 0326   HGB 14.5 01/07/2018 1116   HCT 40.2 02/11/2020 0326   HCT 42.2 01/07/2018 1116   PLT 247 02/11/2020 0326   PLT 267 01/07/2018 1116   MCV 95.0 02/11/2020 0326   MCV 89 01/07/2018 1116   MCH 30.5 02/11/2020 0326   MCHC 32.1 02/11/2020 0326   RDW 14.3 02/11/2020 0326   RDW 14.2 01/07/2018 1116   LYMPHSABS 3.7 02/11/2020 0326   LYMPHSABS 3.9 (H) 01/07/2018 1116   MONOABS 0.9 02/11/2020 0326   EOSABS 0.2 02/11/2020 0326   EOSABS 0.1 01/07/2018 1116   BASOSABS 0.1 02/11/2020 0326   BASOSABS 0.1 01/07/2018 1116    CHEMISTRY No results for  input(s): NA, K, CL, CO2, GLUCOSE, BUN, CREATININE, CALCIUM, MG, PHOS in the last 168 hours. CrCl cannot be calculated (Patient's most recent lab result is older than the maximum 21 days allowed.). ***  Chest Imaging- films reviewed: ***  Pulmonary Functions Testing Results: No flowsheet data found.  Pathology 02/21/20: Left lower lobe-reactive bronchial epithelial cells Station 7-lymphocytes present, no carcinoma. Flow cytometry was not completed.  Echocardiogram: ***  Heart Catheterization: ***    Assessment & Plan:   No diagnosis found.    Current Outpatient Medications:  .  albuterol (PROAIR HFA) 108 (90 Base) MCG/ACT inhaler, Inhale 2 puffs into the lungs every 4 (four) hours as needed for wheezing or shortness of breath. Inhale two puffs every four to six hours as needed for breathing problems. , Disp: , Rfl:  .  albuterol (PROVENTIL) (2.5 MG/3ML) 0.083% nebulizer solution, Take 2.5 mg by nebulization every 4 (four) hours as needed for wheezing or shortness of breath., Disp: , Rfl:  .  baclofen (LIORESAL) 10 MG tablet, Take 1 tablet (10 mg total) by mouth 3 (three) times daily as needed for muscle spasms., Disp: 20 each, Rfl: 0 .  budesonide-formoterol (SYMBICORT) 160-4.5 MCG/ACT inhaler, Inhale 1 puff into the lungs 2 (two) times daily., Disp: 1 Inhaler, Rfl: 12 .  Calcium-Magnesium-Vitamin D (CALCIUM MAGNESIUM PO), Take 1,000 mg by mouth 4 (four) times daily. , Disp: , Rfl:  .  chlorthalidone (HYGROTON) 25 MG tablet, Take 25 mg by mouth daily., Disp: , Rfl: 3 .  cloNIDine (CATAPRES) 0.1 MG tablet, Take 1 tablet (0.1 mg total) by mouth 2 (two) times daily., Disp: 60 tablet,  Rfl: 0 .  famotidine (PEPCID) 40 MG tablet, Take 40 mg by mouth daily., Disp: , Rfl:  .  fluticasone (FLONASE) 50 MCG/ACT nasal spray, Place 1 spray into both nostrils daily., Disp: 15 mL, Rfl: 0 .  gabapentin (NEURONTIN) 800 MG tablet, Take 800 mg by mouth 4 (four) times daily. , Disp: , Rfl: 5 .   ipratropium (ATROVENT) 0.02 % nebulizer solution, Can use one vial in nebulizer every four to six hours as needed for cough or wheeze.  Mix with Albuterol. (Patient taking differently: Take 0.5 mg by nebulization every 2 (two) hours as needed for wheezing or shortness of breath. Can use one vial in nebulizer every four to six hours as needed for cough or wheeze.  Mix with Albuterol.), Disp: 150 mL, Rfl: 1 .  ipratropium-albuterol (DUONEB) 0.5-2.5 (3) MG/3ML SOLN, Take 3 mLs by nebulization 2 (two) times daily., Disp: 360 mL, Rfl: 0 .  loratadine (CLARITIN) 10 MG tablet, Take 10 mg by mouth daily., Disp: , Rfl:  .  LORazepam (ATIVAN) 0.5 MG tablet, Take 0.5 mg by mouth 2 (two) times daily as needed (Depression). , Disp: , Rfl: 2 .  meclizine (ANTIVERT) 12.5 MG tablet, Take 12.5 mg by mouth 2 (two) times daily as needed for dizziness. , Disp: , Rfl: 1 .  metroNIDAZOLE (METROCREAM) 0.75 % cream, Apply 1 application topically daily as needed (acne). , Disp: , Rfl: 30 .  Misc Natural Products (OSTEO BI-FLEX JOINT SHIELD PO), Take 1 tablet by mouth in the morning and at bedtime. , Disp: , Rfl:  .  Omega-3 Fatty Acids (FISH OIL) 1200 MG CAPS, Take 1,200 mg by mouth daily. , Disp: , Rfl:  .  ondansetron (ZOFRAN) 4 MG tablet, Take 1 tablet (4 mg total) by mouth every 8 (eight) hours as needed for nausea or vomiting., Disp: 20 tablet, Rfl: 0 .  potassium gluconate 595 (99 K) MG TABS tablet, Take 595 mg by mouth daily., Disp: , Rfl:  .  pravastatin (PRAVACHOL) 40 MG tablet, Take 40 mg by mouth daily. , Disp: , Rfl:  .  venlafaxine (EFFEXOR) 75 MG tablet, Take 75 mg by mouth daily. , Disp: , Rfl:  .  Vitamin D3 (VITAMIN D) 25 MCG tablet, Take 1,000 Units by mouth daily., Disp: , Rfl:    I spent *** minutes on this encounter, including face to face time and non-face to face time spent reviewing records, charting, coordinating care, etc.   Julian Hy, DO [facility] 04/12/2020 8:51 AM

## 2020-04-13 DIAGNOSIS — M25612 Stiffness of left shoulder, not elsewhere classified: Secondary | ICD-10-CM | POA: Diagnosis not present

## 2020-04-13 DIAGNOSIS — M25512 Pain in left shoulder: Secondary | ICD-10-CM | POA: Diagnosis not present

## 2020-04-13 DIAGNOSIS — M6281 Muscle weakness (generalized): Secondary | ICD-10-CM | POA: Diagnosis not present

## 2020-04-16 DIAGNOSIS — M25612 Stiffness of left shoulder, not elsewhere classified: Secondary | ICD-10-CM | POA: Diagnosis not present

## 2020-04-16 DIAGNOSIS — M25512 Pain in left shoulder: Secondary | ICD-10-CM | POA: Diagnosis not present

## 2020-04-16 DIAGNOSIS — M6281 Muscle weakness (generalized): Secondary | ICD-10-CM | POA: Diagnosis not present

## 2020-04-17 ENCOUNTER — Ambulatory Visit (INDEPENDENT_AMBULATORY_CARE_PROVIDER_SITE_OTHER)
Admission: RE | Admit: 2020-04-17 | Discharge: 2020-04-17 | Disposition: A | Discharge status: Home / Self Care | Payer: Medicare HMO | Source: Ambulatory Visit | Attending: Adult Health | Admitting: Adult Health

## 2020-04-17 ENCOUNTER — Other Ambulatory Visit: Payer: Self-pay

## 2020-04-17 DIAGNOSIS — R0602 Shortness of breath: Secondary | ICD-10-CM

## 2020-04-18 DIAGNOSIS — M6281 Muscle weakness (generalized): Secondary | ICD-10-CM | POA: Diagnosis not present

## 2020-04-18 DIAGNOSIS — M25612 Stiffness of left shoulder, not elsewhere classified: Secondary | ICD-10-CM | POA: Diagnosis not present

## 2020-04-18 DIAGNOSIS — M25512 Pain in left shoulder: Secondary | ICD-10-CM | POA: Diagnosis not present

## 2020-04-20 ENCOUNTER — Telehealth: Payer: Self-pay | Admitting: Adult Health

## 2020-04-20 NOTE — Telephone Encounter (Signed)
Spoke with [name] ok per DPR and gave results.

## 2020-04-20 NOTE — Telephone Encounter (Signed)
Please advise on patient CT results from 6/8

## 2020-04-20 NOTE — Telephone Encounter (Signed)
Chest x-ray shows normal heart size, mild atherosclerosis.  No enlarged lymph nodes.  Very microscopic pulmonary nodules bilateral lungs upper lobes measuring 3 mm these are stable.  There is no new suspicious pulmonary nodules Mild subpleural fibrosis lower lobe predominant.  Previous lingular and left lower lobe opacities have resolved.  Overall CT chest has improved.  Continue with office visit recommendations and follow-up We will discuss in full detail on return visit

## 2020-04-23 DIAGNOSIS — M6281 Muscle weakness (generalized): Secondary | ICD-10-CM | POA: Diagnosis not present

## 2020-04-23 DIAGNOSIS — M25612 Stiffness of left shoulder, not elsewhere classified: Secondary | ICD-10-CM | POA: Diagnosis not present

## 2020-04-23 DIAGNOSIS — M25512 Pain in left shoulder: Secondary | ICD-10-CM | POA: Diagnosis not present

## 2020-04-25 DIAGNOSIS — M6281 Muscle weakness (generalized): Secondary | ICD-10-CM | POA: Diagnosis not present

## 2020-04-25 DIAGNOSIS — M25612 Stiffness of left shoulder, not elsewhere classified: Secondary | ICD-10-CM | POA: Diagnosis not present

## 2020-04-25 DIAGNOSIS — M25512 Pain in left shoulder: Secondary | ICD-10-CM | POA: Diagnosis not present

## 2020-04-27 DIAGNOSIS — M25562 Pain in left knee: Secondary | ICD-10-CM | POA: Diagnosis not present

## 2020-05-28 ENCOUNTER — Other Ambulatory Visit (HOSPITAL_COMMUNITY)
Admission: RE | Admit: 2020-05-28 | Discharge: 2020-05-28 | Disposition: A | Discharge status: Home / Self Care | Payer: Medicare HMO | Source: Ambulatory Visit | Attending: Internal Medicine | Admitting: Internal Medicine

## 2020-05-28 DIAGNOSIS — Z20822 Contact with and (suspected) exposure to covid-19: Secondary | ICD-10-CM | POA: Diagnosis not present

## 2020-05-28 DIAGNOSIS — Z01812 Encounter for preprocedural laboratory examination: Secondary | ICD-10-CM | POA: Diagnosis not present

## 2020-05-28 LAB — SARS CORONAVIRUS 2 (TAT 6-24 HRS): SARS Coronavirus 2: NEGATIVE

## 2020-05-31 ENCOUNTER — Ambulatory Visit: Payer: Medicare HMO

## 2020-05-31 ENCOUNTER — Other Ambulatory Visit: Payer: Self-pay

## 2020-05-31 ENCOUNTER — Ambulatory Visit: Payer: Medicare HMO | Admitting: Internal Medicine

## 2020-05-31 ENCOUNTER — Ambulatory Visit (INDEPENDENT_AMBULATORY_CARE_PROVIDER_SITE_OTHER): Payer: Medicare HMO

## 2020-05-31 ENCOUNTER — Encounter: Payer: Self-pay | Admitting: Internal Medicine

## 2020-05-31 ENCOUNTER — Ambulatory Visit (INDEPENDENT_AMBULATORY_CARE_PROVIDER_SITE_OTHER): Payer: Medicare HMO | Admitting: Internal Medicine

## 2020-05-31 VITALS — BP 120/80 | HR 68 | Temp 97.3°F | Ht 65.0 in | Wt 285.0 lb

## 2020-05-31 DIAGNOSIS — R0602 Shortness of breath: Secondary | ICD-10-CM | POA: Diagnosis not present

## 2020-05-31 DIAGNOSIS — G4733 Obstructive sleep apnea (adult) (pediatric): Secondary | ICD-10-CM

## 2020-05-31 DIAGNOSIS — E662 Morbid (severe) obesity with alveolar hypoventilation: Secondary | ICD-10-CM | POA: Diagnosis not present

## 2020-05-31 DIAGNOSIS — J4530 Mild persistent asthma, uncomplicated: Secondary | ICD-10-CM

## 2020-05-31 DIAGNOSIS — J189 Pneumonia, unspecified organism: Secondary | ICD-10-CM | POA: Diagnosis not present

## 2020-05-31 DIAGNOSIS — R0600 Dyspnea, unspecified: Secondary | ICD-10-CM | POA: Diagnosis not present

## 2020-05-31 LAB — PULMONARY FUNCTION TEST
DL/VA % pred: 140 %
DL/VA: 5.82 ml/min/mmHg/L
DLCO cor % pred: 122 %
DLCO cor: 24.93 ml/min/mmHg
DLCO unc % pred: 122 %
DLCO unc: 24.93 ml/min/mmHg
FEF 25-75 Post: 2.31 L/sec
FEF 25-75 Pre: 2.15 L/sec
FEF2575-%Change-Post: 7 %
FEF2575-%Pred-Post: 110 %
FEF2575-%Pred-Pre: 102 %
FEV1-%Change-Post: 1 %
FEV1-%Pred-Post: 81 %
FEV1-%Pred-Pre: 80 %
FEV1-Post: 1.99 L
FEV1-Pre: 1.97 L
FEV1FVC-%Change-Post: -1 %
FEV1FVC-%Pred-Pre: 108 %
FEV6-%Change-Post: 2 %
FEV6-%Pred-Post: 78 %
FEV6-%Pred-Pre: 76 %
FEV6-Post: 2.44 L
FEV6-Pre: 2.37 L
FEV6FVC-%Pred-Post: 104 %
FEV6FVC-%Pred-Pre: 104 %
FVC-%Change-Post: 2 %
FVC-%Pred-Post: 75 %
FVC-%Pred-Pre: 73 %
FVC-Post: 2.44 L
FVC-Pre: 2.37 L
Post FEV1/FVC ratio: 82 %
Post FEV6/FVC ratio: 100 %
Pre FEV1/FVC ratio: 83 %
Pre FEV6/FVC Ratio: 100 %
RV % pred: 110 %
RV: 2.4 L
TLC % pred: 95 %
TLC: 4.95 L

## 2020-05-31 NOTE — Progress Notes (Signed)
HPI  F former smoker followed for OSA, complicated by CHF, Allergic Rhinitis, Asthma, OHS, Morbid Obesity, Bronch 02/21/20- Dr Carlis Abbott- for L mass> Benign, lymphocytes, no malignancy  ------------------------------------------------------------  03/19/20- 68 yoF former smoker referred by Rexene Edison, NP for sleep evaluation with concern of possible OSA. Medical problem list includes CHF, Allergic Rhinitis, Asthma, OHS, Morbid Obesity,  Had office f/u 4/20 after hosp for rotator cuff repair, when she was noted to have possible lung mass and smoking changes. Noted in hosp to be snoring with witnessed apnea. General pulmonary f/u with Dr Carlis Abbott and PFT pending. Epworth score 17 Body weight today 285 lbs      Sister here Specific issue for me today is possible sleep apnea. OSA was dx'd by sleep study at [facility] over 10 years ago, but she couldn't afford CPAP so nothing was done. A brother and son use CPAP.  Sister lives with her and describes snoring. Sleep talking and frequent position changes. No more complex parasomnias. Admits daytime sleepiness.  No sleep med. 1-2 cups AM coffee.  Denies ENT surgery, neurologic or thyroid disease. Bedtime 10PM, takes 1-2 hrs to fall asleep. Wakes 3-4 times before finally up around 9AM.   7/212/21- 68 yoF former smoker followed for OSA, complicated by CHF, Allergic Rhinitis, Asthma, OHS, Morbid Obesity, Left lung mass, HST- 05/31/20-picking up machine today Bronch 02/21/20- Benign, lymphocytes, no malignancy PFT 05/31/20- slight restriction of exhaled volume, Increased DLCO/VA, no resp to BD Body weight today 285 lbs Little cough and no new concern. Some wheeze with exerton, easy DOE on hills and stairs. Grandchildren here- long drive to get here so tries to consolidate visits= picking up HST kit today.  Has seen Dr Neldon Mc for allergy eval. Had 2 Moderna Covax CT chest 04/17/20- Lungs/Pleura: Small subpleural nodules in the bilateral upper lobes, measuring  up to 3 mm, unchanged. No new/suspicious pulmonary nodules. Mild subpleural reticulation/fibrosis, lower lobe predominant, unchanged. No focal consolidation. Prior lingular and left lower lobe opacities have improved/resolved. No pleural effusion or pneumothorax. IMPRESSION: No evidence of acute cardiopulmonary disease. Stable small subpleural nodules measuring up to 3 mm, unchanged, benign. Dedicated follow-up imaging is not required per Fleischner Society guidelines. This recommendation follows the consensus statement: Guidelines for Management of Small Pulmonary Nodules Detected on CT Images: From the Fleischner Society 2017; Radiology 2017; 284:228-243. Aortic Atherosclerosis (ICD10-I70.0).   ROS-see HPI   + = positive Constitutional:    weight loss, night sweats, fevers, chills, fatigue, lassitude. HEENT:   + headaches, difficulty swallowing,+ tooth/dental problems, sore throat,       sneezing, itching, ear ache, nasal congestion, post nasal drip, snoring CV:    chest pain, orthopnea, PND, +swelling in lower extremities, anasarca,                                  dizziness, palpitations Resp:   +shortness of breath with exertion or at rest.                +productive  non-productive cough, coughing up of blood.              change in color of mucus.  wheezing.   Skin:    rash or lesions. GI:  +heartburn, indigestion, abdominal pain, nausea, vomiting, diarrhea,                 change in bowel habits, loss of appetite GU: dysuria, change in  color of urine, no urgency or frequency.   flank pain. MS:   +joint pain, stiffness, decreased range of motion, back pain. Neuro-     nothing unusual Psych:  change in mood or affect.  depression or +anxiety.   memory loss.  OBJ- Physical Exam General- Alert, Oriented, Affect-appropriate, Distress- none acute, + morbidly obese Skin- rash-none, lesions- none, excoriation- none Lymphadenopathy- none Head- atraumatic            Eyes- Gross  vision intact, PERRLA, conjunctivae and secretions clear            Ears- Hearing, canals-normal            Nose- Clear, no-Septal dev, mucus, polyps, erosion, perforation             Throat- Mallampati II-III, mucosa clear , drainage- none, tonsils- atrophic Neck- flexible , trachea midline, no stridor , thyroid nl, carotid no bruit Chest - symmetrical excursion , unlabored           Heart/CV- RRR , no murmur , no gallop  , no rub, nl s1 s2                           - JVD- none , edema- none, stasis changes- none, varices- none           Lung- clear to P&A, wheeze- none, cough- none , dullness-none, rub- none           Chest wall-  Abd-  Br/ Gen/ Rectal- Not done, not indicated Extrem- cyanosis- none, clubbing, none, atrophy- none, strength- nl Neuro- grossly intact to observation

## 2020-05-31 NOTE — Patient Instructions (Signed)
We can follow your breathing problems here- please let us know if you need our help.  Please call us about 2 weeks after your sleep test to see if results and recommendations are ready yet. If appropriate, we may be able to start treatment before we see you next.   Order CXR   Dx Dyspnea on exertion

## 2020-05-31 NOTE — Progress Notes (Signed)
Full PFT performed today. °

## 2020-06-01 DIAGNOSIS — G4733 Obstructive sleep apnea (adult) (pediatric): Secondary | ICD-10-CM | POA: Diagnosis not present

## 2020-06-06 ENCOUNTER — Telehealth: Payer: Self-pay | Admitting: Internal Medicine

## 2020-06-06 ENCOUNTER — Encounter: Payer: Self-pay | Admitting: Internal Medicine

## 2020-06-06 NOTE — Assessment & Plan Note (Signed)
Moderate persistent uncomplicated Plan- continue Symbicort as directed

## 2020-06-06 NOTE — Telephone Encounter (Signed)
Called and spoke with pt letting her know the info stated by CY and she verbalized understanding. Nothing further needed. 

## 2020-06-06 NOTE — Telephone Encounter (Signed)
This study hasn't been released to me yet. We usually expect to have study read and results into computer for access about 2 weeks after the study date.

## 2020-06-06 NOTE — Progress Notes (Signed)
Spoke with patient and provided results per Dr. Annamaria Boots.  CXR- lungs and heart appear within normal.  Patient verbalized understanding.  Nothing further needed.

## 2020-06-06 NOTE — Assessment & Plan Note (Signed)
Culture negative, cytology benign, resolving on f/u imaging. This may have been an aspiration event. Plan- obsevation

## 2020-06-06 NOTE — Telephone Encounter (Signed)
Spoke with patient, provided results of cxr, see result note.  Advised that Dr. Annamaria Boots needs to review her sleep study and then we will call her and provide the results and any recommendations.

## 2020-06-06 NOTE — Assessment & Plan Note (Signed)
Morbidly obese and clinically at risk for obesity hypoventilation. Plan- consider if ABG needed.

## 2020-06-06 NOTE — Assessment & Plan Note (Signed)
Presumptive dx. She is picking up home test kit today and we will review at next ov.

## 2020-06-06 NOTE — Telephone Encounter (Signed)
Patient is calling about results of sleep study.  Please review and advise.  Thank you.

## 2020-06-22 ENCOUNTER — Ambulatory Visit: Payer: Medicare HMO | Admitting: Internal Medicine

## 2020-08-08 ENCOUNTER — Telehealth: Payer: Self-pay | Admitting: Internal Medicine

## 2020-08-08 DIAGNOSIS — G4733 Obstructive sleep apnea (adult) (pediatric): Secondary | ICD-10-CM

## 2020-08-08 NOTE — Telephone Encounter (Signed)
Pt calling requesting to know the results of the HST she had performed. Dr. Annamaria Boots, please advise.

## 2020-08-08 NOTE — Telephone Encounter (Signed)
Home sleep test showed obstructive sleep apnea averaging 13 apneas/ hour, with drops in blood oxygen level  I recommend we order new DME, new CPAP auto 5-15, mask of choice, humidifier, supplies, AirView/ card She needs return ov in 31-90 days- suggest changing her current return appt in late [place] out a little farther to be in this time window.

## 2020-08-08 NOTE — Telephone Encounter (Signed)
Please see my response below

## 2020-08-08 NOTE — Telephone Encounter (Signed)
Daughter-in-law, [name], listed on the DPR, called regarding sleep study results.  Provided sleep study results per Dr. Annamaria Boots.  She verbalized understanding.  Orders sent to [facility] per family preference.  F/u 31-90 days after starting CPAP scheduled.  Nothing further needed at this time.

## 2020-09-03 DIAGNOSIS — J45909 Unspecified asthma, uncomplicated: Secondary | ICD-10-CM | POA: Diagnosis not present

## 2020-09-05 ENCOUNTER — Ambulatory Visit: Payer: Medicare HMO | Admitting: Internal Medicine

## 2020-09-13 IMAGING — DX DG CHEST 1V PORT
1 series · 1 of 1 positions shown · non-contrast
Comparison: 02/08/2020

CLINICAL DATA: Post bronchoscopy

EXAM:
PORTABLE CHEST 1 VIEW

[chest ap]
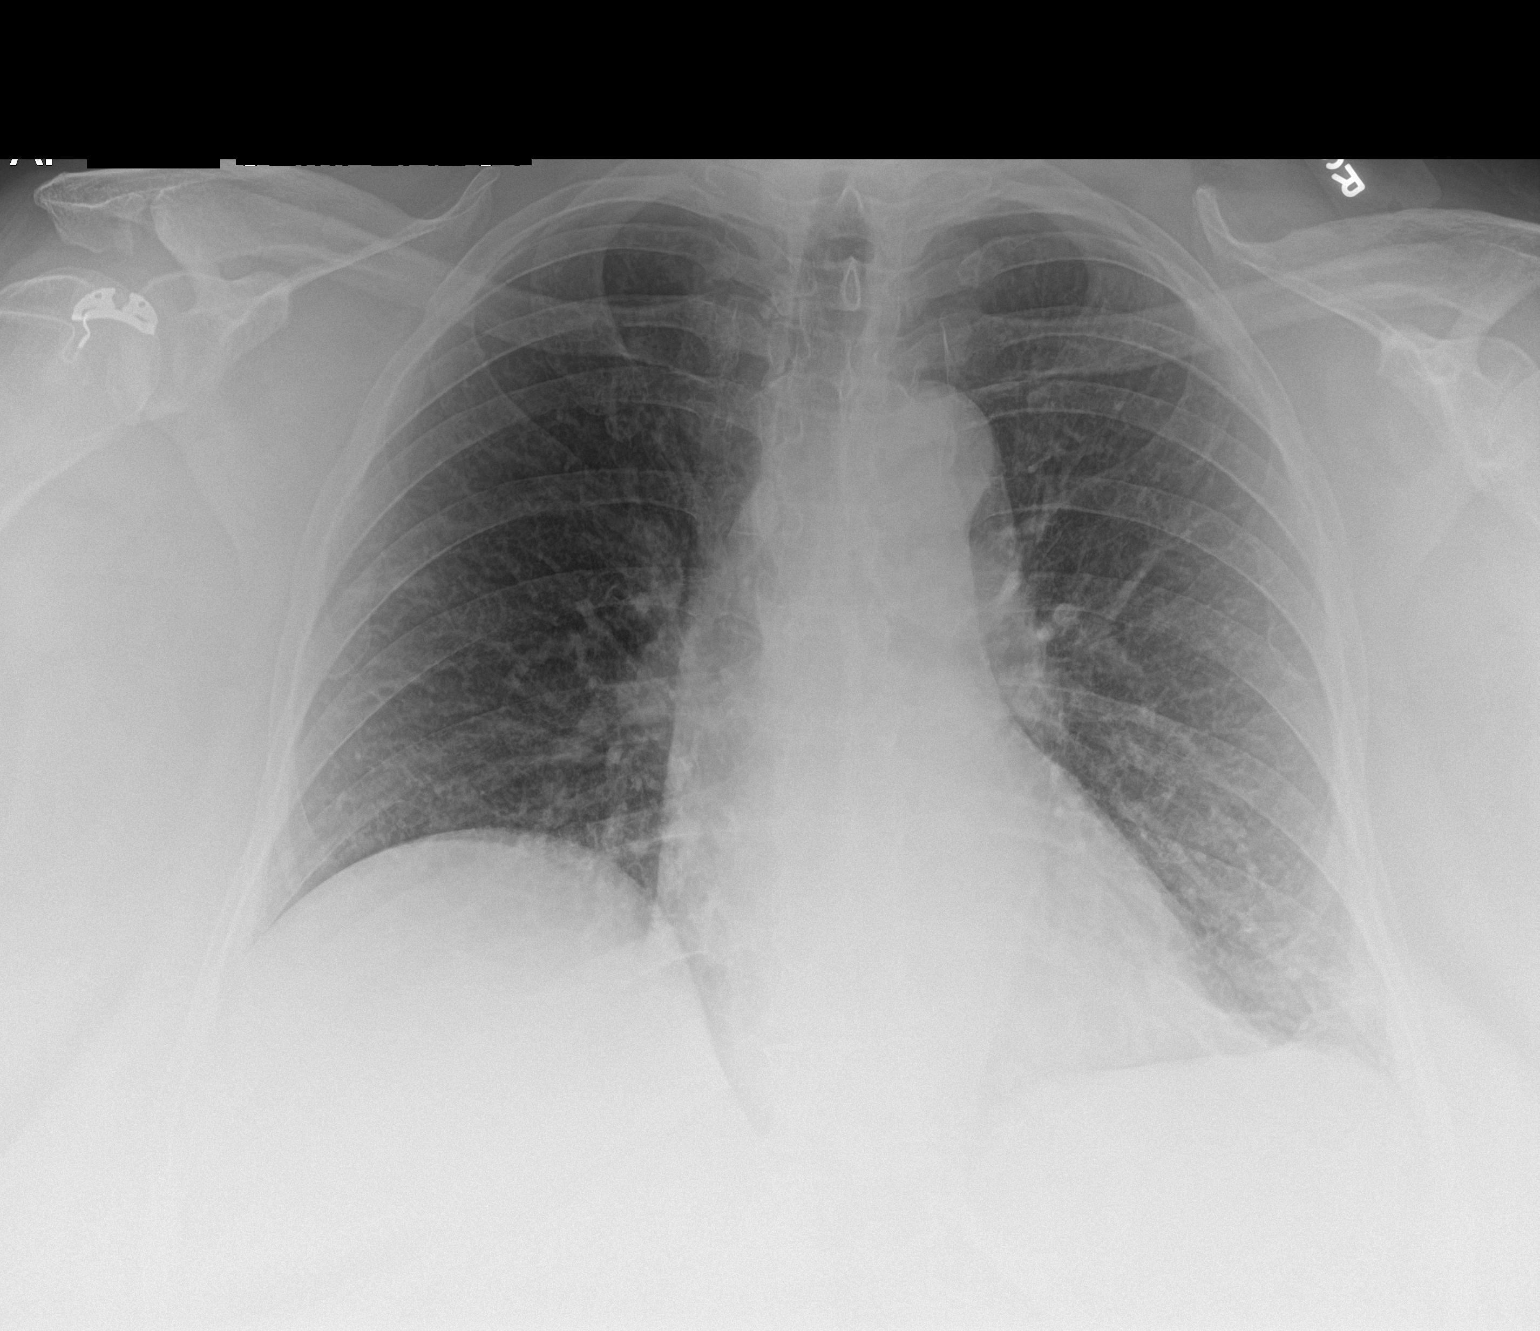

[1 of 1 positions shown; findings below may reference images not displayed]

FINDINGS: Elevated/eventrated right diaphragm, also suggested on prior CT
02/09/2020. Normal heart size and stable mediastinal contours. There
is no edema, consolidation, effusion, or pneumothorax.
IMPRESSION: No acute finding after bronchoscopy.

## 2020-09-20 DIAGNOSIS — Z1231 Encounter for screening mammogram for malignant neoplasm of breast: Secondary | ICD-10-CM | POA: Diagnosis not present

## 2020-10-04 DIAGNOSIS — J45909 Unspecified asthma, uncomplicated: Secondary | ICD-10-CM | POA: Diagnosis not present

## 2020-10-09 DIAGNOSIS — E785 Hyperlipidemia, unspecified: Secondary | ICD-10-CM | POA: Diagnosis not present

## 2020-10-09 DIAGNOSIS — I5032 Chronic diastolic (congestive) heart failure: Secondary | ICD-10-CM | POA: Diagnosis not present

## 2020-10-09 DIAGNOSIS — Z23 Encounter for immunization: Secondary | ICD-10-CM | POA: Diagnosis not present

## 2020-10-09 DIAGNOSIS — I2510 Atherosclerotic heart disease of native coronary artery without angina pectoris: Secondary | ICD-10-CM | POA: Diagnosis not present

## 2020-10-09 DIAGNOSIS — Z79899 Other long term (current) drug therapy: Secondary | ICD-10-CM | POA: Diagnosis not present

## 2020-10-09 DIAGNOSIS — I519 Heart disease, unspecified: Secondary | ICD-10-CM | POA: Diagnosis not present

## 2020-10-09 DIAGNOSIS — N3001 Acute cystitis with hematuria: Secondary | ICD-10-CM | POA: Diagnosis not present

## 2020-10-09 DIAGNOSIS — R7301 Impaired fasting glucose: Secondary | ICD-10-CM | POA: Diagnosis not present

## 2020-10-09 DIAGNOSIS — I110 Hypertensive heart disease with heart failure: Secondary | ICD-10-CM | POA: Diagnosis not present

## 2020-10-09 DIAGNOSIS — Z0000 Encounter for general adult medical examination without abnormal findings: Secondary | ICD-10-CM | POA: Diagnosis not present

## 2020-10-18 ENCOUNTER — Other Ambulatory Visit: Payer: Self-pay | Admitting: Family Medicine

## 2020-10-18 DIAGNOSIS — N6489 Other specified disorders of breast: Secondary | ICD-10-CM

## 2020-10-18 DIAGNOSIS — R928 Other abnormal and inconclusive findings on diagnostic imaging of breast: Secondary | ICD-10-CM | POA: Diagnosis not present

## 2020-10-25 DIAGNOSIS — Z23 Encounter for immunization: Secondary | ICD-10-CM | POA: Diagnosis not present

## 2020-10-29 ENCOUNTER — Other Ambulatory Visit: Payer: Self-pay | Admitting: Family Medicine

## 2020-10-29 ENCOUNTER — Ambulatory Visit
Admission: RE | Admit: 2020-10-29 | Discharge: 2020-10-29 | Disposition: A | Discharge status: Home / Self Care | Payer: Medicare HMO | Source: Ambulatory Visit | Attending: Family Medicine | Admitting: Family Medicine

## 2020-10-29 ENCOUNTER — Other Ambulatory Visit: Payer: Self-pay

## 2020-10-29 DIAGNOSIS — N6489 Other specified disorders of breast: Secondary | ICD-10-CM

## 2020-11-03 DIAGNOSIS — J45909 Unspecified asthma, uncomplicated: Secondary | ICD-10-CM | POA: Diagnosis not present

## 2020-11-07 NOTE — Progress Notes (Signed)
HPI  F former smoker followed for OSA, complicated by CHF, Allergic Rhinitis, Asthma, OHS, Morbid Obesity, Bronch 02/21/20- Dr Carlis Abbott- for L mass> Benign, lymphocytes, no malignancy PFT 05/31/20- slight restriction of exhaled volume, Increased DLCO/VA, no resp to BD ------------------------------------------------------------  7/212/21- 68 yoF former smoker followed for OSA, complicated by CHF, Allergic Rhinitis, Asthma, OHS, Morbid Obesity, Left lung mass, HST- 05/31/20-picking up test kit today Bronch 02/21/20- Benign, lymphocytes, no malignancy PFT 05/31/20- slight restriction of exhaled volume, Increased DLCO/VA, no resp to BD Body weight today 285 lbs Little cough and no new concern. Some wheeze with exerton, easy DOE on hills and stairs. Grandchildren here- long drive to get here so tries to consolidate visits= picking up HST kit today.  Has seen Dr Neldon Mc for allergy eval. Had 2 Moderna Covax CT chest 04/17/20- Lungs/Pleura: Small subpleural nodules in the bilateral upper lobes, measuring up to 3 mm, unchanged. No new/suspicious pulmonary nodules. Mild subpleural reticulation/fibrosis, lower lobe predominant, unchanged. No focal consolidation. Prior lingular and left lower lobe opacities have improved/resolved. No pleural effusion or pneumothorax. IMPRESSION: No evidence of acute cardiopulmonary disease. Stable small subpleural nodules measuring up to 3 mm, unchanged, benign. Dedicated follow-up imaging is not required per Fleischner Society guidelines. This recommendation follows the consensus statement: Guidelines for Management of Small Pulmonary Nodules Detected on CT Images: From the Fleischner Society 2017; Radiology 2017; 284:228-243. Aortic Atherosclerosis (ICD10-I70.0).    11/08/20- 68 yoF former smoker followed for OSA, complicated by CHF, Allergic Rhinitis, Asthma, OHS, Morbid Obesity,  Bronch 02/21/20- Benign, lymphocytes, no malignancy HST 05/31/20- AHI 13.5/ hr,  desaturation to 68% with mean 89%, body weight 285 lbs CPAP  Auto 5-15/ [facility] new 08/08/20  Download- compliance 77%, AHI 5.3/ hr     Pressure range 8-10.6 Body weight today- 301.8 lbs Covid vax-2 Moderna Flu vax-had Bedtime 11PM but says sleep latency may be 2 hours. Sister has commented that she gets sleepy if she sits. Note she is taking Ativan twice daily in the morning and at 3PM.  Mask fit leaks. CXR 05/31/20-  FINDINGS: The heart size and mediastinal contours are within normal limits. Both lungs are clear. The visualized skeletal structures are unremarkable. IMPRESSION: No active cardiopulmonary disease.  ROS-see HPI   + = positive Constitutional:    weight loss, night sweats, fevers, chills, fatigue, lassitude. HEENT:   + headaches, difficulty swallowing,+ tooth/dental problems, sore throat,       sneezing, itching, ear ache, nasal congestion, post nasal drip, snoring CV:    chest pain, orthopnea, PND, +swelling in lower extremities, anasarca,                                   dizziness, palpitations Resp:   +shortness of breath with exertion or at rest.                +productive  non-productive cough, coughing up of blood.              change in color of mucus.  wheezing.   Skin:    rash or lesions. GI:  +heartburn, indigestion, abdominal pain, nausea, vomiting, diarrhea,                 change in bowel habits, loss of appetite GU: dysuria, change in color of urine, no urgency or frequency.   flank pain. MS:   +joint pain, stiffness, decreased range of motion, back pain. Neuro-  nothing unusual Psych:  change in mood or affect.  depression or +anxiety.   memory loss.  OBJ- Physical Exam General- Alert, Oriented, Affect-appropriate, Distress- none acute, + morbidly obese Skin- rash-none, lesions- none, excoriation- none Lymphadenopathy- none Head- atraumatic            Eyes- Gross vision intact, PERRLA, conjunctivae and secretions clear            Ears- Hearing,  canals-normal            Nose- Clear, no-Septal dev, mucus, polyps, erosion, perforation             Throat- Mallampati II-III, mucosa clear , drainage- none, tonsils- atrophic Neck- flexible , trachea midline, no stridor , thyroid nl, carotid no bruit Chest - symmetrical excursion , unlabored           Heart/CV- RRR , no murmur , no gallop  , no rub, nl s1 s2                           - JVD- none , edema- none, stasis changes- none, varices- none           Lung- clear to P&A, wheeze- none, cough- none , dullness-none, rub- none           Chest wall-  Abd-  Br/ Gen/ Rectal- Not done, not indicated Extrem- cyanosis- none, clubbing, none, atrophy- none, strength- nl Neuro- grossly intact to observation

## 2020-11-08 ENCOUNTER — Encounter: Payer: Self-pay | Admitting: Internal Medicine

## 2020-11-08 ENCOUNTER — Other Ambulatory Visit: Payer: Self-pay

## 2020-11-08 ENCOUNTER — Ambulatory Visit: Payer: Medicare HMO | Admitting: Internal Medicine

## 2020-11-08 VITALS — BP 124/70 | HR 83 | Temp 97.9°F | Ht 65.0 in | Wt 301.8 lb

## 2020-11-08 DIAGNOSIS — J4530 Mild persistent asthma, uncomplicated: Secondary | ICD-10-CM | POA: Diagnosis not present

## 2020-11-08 DIAGNOSIS — G4733 Obstructive sleep apnea (adult) (pediatric): Secondary | ICD-10-CM | POA: Diagnosis not present

## 2020-11-08 DIAGNOSIS — R0602 Shortness of breath: Secondary | ICD-10-CM | POA: Diagnosis not present

## 2020-11-08 DIAGNOSIS — R400 Somnolence: Secondary | ICD-10-CM

## 2020-11-08 LAB — TSH: TSH: 2.23 u[IU]/mL (ref 0.35–4.50)

## 2020-11-08 NOTE — Patient Instructions (Addendum)
Order- lab  TSH    Dx somnolence  We can continue CPAP auto 5-15,   If [facility] home [facility] can't get a mask that fits you well, please let us know and we will see if we can help.  Suggest you take your second dose of lorazepam a little before bedtime, so it can work as a sleeping pill to help the insomnia problem.  If you can call us the name of your previous sleep medicine, that will help me.   Order- DME [facility]- please replace old nebulizer machine   Dx COPD mixed type

## 2020-12-04 DIAGNOSIS — I5032 Chronic diastolic (congestive) heart failure: Secondary | ICD-10-CM | POA: Diagnosis not present

## 2020-12-04 DIAGNOSIS — J45909 Unspecified asthma, uncomplicated: Secondary | ICD-10-CM | POA: Diagnosis not present

## 2020-12-04 DIAGNOSIS — E662 Morbid (severe) obesity with alveolar hypoventilation: Secondary | ICD-10-CM | POA: Diagnosis not present

## 2020-12-04 DIAGNOSIS — Z6841 Body Mass Index (BMI) 40.0 and over, adult: Secondary | ICD-10-CM | POA: Diagnosis not present

## 2020-12-04 DIAGNOSIS — B373 Candidiasis of vulva and vagina: Secondary | ICD-10-CM | POA: Diagnosis not present

## 2020-12-04 DIAGNOSIS — I700 Atherosclerosis of aorta: Secondary | ICD-10-CM | POA: Diagnosis not present

## 2020-12-04 DIAGNOSIS — B372 Candidiasis of skin and nail: Secondary | ICD-10-CM | POA: Diagnosis not present

## 2020-12-04 DIAGNOSIS — I110 Hypertensive heart disease with heart failure: Secondary | ICD-10-CM | POA: Diagnosis not present

## 2020-12-04 DIAGNOSIS — I25119 Atherosclerotic heart disease of native coronary artery with unspecified angina pectoris: Secondary | ICD-10-CM | POA: Diagnosis not present

## 2020-12-04 DIAGNOSIS — J410 Simple chronic bronchitis: Secondary | ICD-10-CM | POA: Diagnosis not present

## 2020-12-06 DIAGNOSIS — J45909 Unspecified asthma, uncomplicated: Secondary | ICD-10-CM | POA: Diagnosis not present

## 2020-12-09 DIAGNOSIS — G2581 Restless legs syndrome: Secondary | ICD-10-CM | POA: Diagnosis not present

## 2020-12-09 DIAGNOSIS — E785 Hyperlipidemia, unspecified: Secondary | ICD-10-CM | POA: Diagnosis not present

## 2020-12-09 DIAGNOSIS — J302 Other seasonal allergic rhinitis: Secondary | ICD-10-CM | POA: Diagnosis not present

## 2020-12-20 DIAGNOSIS — N6323 Unspecified lump in the left breast, lower outer quadrant: Secondary | ICD-10-CM | POA: Diagnosis not present

## 2020-12-20 DIAGNOSIS — Z6841 Body Mass Index (BMI) 40.0 and over, adult: Secondary | ICD-10-CM | POA: Diagnosis not present

## 2020-12-30 NOTE — Assessment & Plan Note (Signed)
Benefits from CPAP with fair compliance and control Plan- continue auto 5-15. Try taking ativan second dose at bedtime to help with sleep latency and reduce daytime somnolence.

## 2020-12-30 NOTE — Assessment & Plan Note (Signed)
Ongoing problem with sedentary life style Consider referral to [facility]

## 2021-01-04 DIAGNOSIS — J45909 Unspecified asthma, uncomplicated: Secondary | ICD-10-CM | POA: Diagnosis not present

## 2021-01-07 ENCOUNTER — Other Ambulatory Visit: Payer: Self-pay | Admitting: Family Medicine

## 2021-01-08 ENCOUNTER — Other Ambulatory Visit: Payer: Medicare HMO | Admitting: Family Medicine

## 2021-01-08 DIAGNOSIS — N6323 Unspecified lump in the left breast, lower outer quadrant: Secondary | ICD-10-CM

## 2021-01-11 DIAGNOSIS — J45909 Unspecified asthma, uncomplicated: Secondary | ICD-10-CM | POA: Diagnosis not present

## 2021-01-28 ENCOUNTER — Other Ambulatory Visit: Payer: Self-pay

## 2021-01-28 ENCOUNTER — Ambulatory Visit
Admission: RE | Admit: 2021-01-28 | Discharge: 2021-01-28 | Disposition: A | Discharge status: Home / Self Care | Payer: Medicare HMO | Source: Ambulatory Visit | Attending: Family Medicine | Admitting: Family Medicine

## 2021-01-28 DIAGNOSIS — N6489 Other specified disorders of breast: Secondary | ICD-10-CM | POA: Diagnosis not present

## 2021-01-28 DIAGNOSIS — N6323 Unspecified lump in the left breast, lower outer quadrant: Secondary | ICD-10-CM

## 2021-01-28 MED ORDER — GADOBENATE DIMEGLUMINE 529 MG/ML IV SOLN
10.0000 mL | Freq: Once | INTRAVENOUS | Status: AC | PRN
  Administered 2021-01-28: 10 mL via INTRAVENOUS

## 2021-02-01 DIAGNOSIS — J45909 Unspecified asthma, uncomplicated: Secondary | ICD-10-CM | POA: Diagnosis not present

## 2021-02-06 DIAGNOSIS — J302 Other seasonal allergic rhinitis: Secondary | ICD-10-CM | POA: Diagnosis not present

## 2021-02-06 DIAGNOSIS — G2581 Restless legs syndrome: Secondary | ICD-10-CM | POA: Diagnosis not present

## 2021-02-06 DIAGNOSIS — E785 Hyperlipidemia, unspecified: Secondary | ICD-10-CM | POA: Diagnosis not present

## 2021-03-04 DIAGNOSIS — J45909 Unspecified asthma, uncomplicated: Secondary | ICD-10-CM | POA: Diagnosis not present

## 2021-03-12 DIAGNOSIS — Z0100 Encounter for examination of eyes and vision without abnormal findings: Secondary | ICD-10-CM | POA: Diagnosis not present

## 2021-03-12 DIAGNOSIS — E119 Type 2 diabetes mellitus without complications: Secondary | ICD-10-CM | POA: Diagnosis not present

## 2021-04-03 DIAGNOSIS — J45909 Unspecified asthma, uncomplicated: Secondary | ICD-10-CM | POA: Diagnosis not present

## 2021-04-04 DIAGNOSIS — J329 Chronic sinusitis, unspecified: Secondary | ICD-10-CM | POA: Diagnosis not present

## 2021-04-04 DIAGNOSIS — Z6841 Body Mass Index (BMI) 40.0 and over, adult: Secondary | ICD-10-CM | POA: Diagnosis not present

## 2021-04-04 DIAGNOSIS — J40 Bronchitis, not specified as acute or chronic: Secondary | ICD-10-CM | POA: Diagnosis not present

## 2021-04-04 DIAGNOSIS — Z20828 Contact with and (suspected) exposure to other viral communicable diseases: Secondary | ICD-10-CM | POA: Diagnosis not present

## 2021-04-08 DIAGNOSIS — J449 Chronic obstructive pulmonary disease, unspecified: Secondary | ICD-10-CM | POA: Diagnosis not present

## 2021-04-08 DIAGNOSIS — G2581 Restless legs syndrome: Secondary | ICD-10-CM | POA: Diagnosis not present

## 2021-04-08 DIAGNOSIS — J302 Other seasonal allergic rhinitis: Secondary | ICD-10-CM | POA: Diagnosis not present

## 2021-04-08 DIAGNOSIS — R0602 Shortness of breath: Secondary | ICD-10-CM | POA: Diagnosis not present

## 2021-04-08 DIAGNOSIS — I700 Atherosclerosis of aorta: Secondary | ICD-10-CM | POA: Diagnosis not present

## 2021-04-08 DIAGNOSIS — E785 Hyperlipidemia, unspecified: Secondary | ICD-10-CM | POA: Diagnosis not present

## 2021-04-08 DIAGNOSIS — J4541 Moderate persistent asthma with (acute) exacerbation: Secondary | ICD-10-CM | POA: Diagnosis not present

## 2021-04-08 DIAGNOSIS — R058 Other specified cough: Secondary | ICD-10-CM | POA: Diagnosis not present

## 2021-04-15 DIAGNOSIS — J189 Pneumonia, unspecified organism: Secondary | ICD-10-CM | POA: Diagnosis not present

## 2021-04-15 DIAGNOSIS — J441 Chronic obstructive pulmonary disease with (acute) exacerbation: Secondary | ICD-10-CM | POA: Diagnosis not present

## 2021-04-15 DIAGNOSIS — Z6841 Body Mass Index (BMI) 40.0 and over, adult: Secondary | ICD-10-CM | POA: Diagnosis not present

## 2021-05-03 ENCOUNTER — Encounter: Payer: Self-pay | Admitting: Internal Medicine

## 2021-05-04 DIAGNOSIS — J45909 Unspecified asthma, uncomplicated: Secondary | ICD-10-CM | POA: Diagnosis not present

## 2021-05-06 ENCOUNTER — Other Ambulatory Visit: Payer: Medicare HMO

## 2021-05-09 ENCOUNTER — Ambulatory Visit: Payer: Medicare HMO | Admitting: Internal Medicine

## 2021-05-09 DIAGNOSIS — J302 Other seasonal allergic rhinitis: Secondary | ICD-10-CM | POA: Diagnosis not present

## 2021-05-09 DIAGNOSIS — E785 Hyperlipidemia, unspecified: Secondary | ICD-10-CM | POA: Diagnosis not present

## 2021-05-09 DIAGNOSIS — G2581 Restless legs syndrome: Secondary | ICD-10-CM | POA: Diagnosis not present

## 2021-05-29 DIAGNOSIS — J410 Simple chronic bronchitis: Secondary | ICD-10-CM | POA: Diagnosis not present

## 2021-05-29 DIAGNOSIS — E785 Hyperlipidemia, unspecified: Secondary | ICD-10-CM | POA: Diagnosis not present

## 2021-05-29 DIAGNOSIS — R7301 Impaired fasting glucose: Secondary | ICD-10-CM | POA: Diagnosis not present

## 2021-05-29 DIAGNOSIS — I25119 Atherosclerotic heart disease of native coronary artery with unspecified angina pectoris: Secondary | ICD-10-CM | POA: Diagnosis not present

## 2021-05-29 DIAGNOSIS — I110 Hypertensive heart disease with heart failure: Secondary | ICD-10-CM | POA: Diagnosis not present

## 2021-05-29 DIAGNOSIS — I5032 Chronic diastolic (congestive) heart failure: Secondary | ICD-10-CM | POA: Diagnosis not present

## 2021-05-29 DIAGNOSIS — B372 Candidiasis of skin and nail: Secondary | ICD-10-CM | POA: Diagnosis not present

## 2021-05-29 DIAGNOSIS — M1991 Primary osteoarthritis, unspecified site: Secondary | ICD-10-CM | POA: Diagnosis not present

## 2021-05-29 DIAGNOSIS — Z79899 Other long term (current) drug therapy: Secondary | ICD-10-CM | POA: Diagnosis not present

## 2021-05-29 DIAGNOSIS — I519 Heart disease, unspecified: Secondary | ICD-10-CM | POA: Diagnosis not present

## 2021-06-03 DIAGNOSIS — J45909 Unspecified asthma, uncomplicated: Secondary | ICD-10-CM | POA: Diagnosis not present

## 2021-06-20 DIAGNOSIS — J45909 Unspecified asthma, uncomplicated: Secondary | ICD-10-CM | POA: Diagnosis not present

## 2021-06-26 NOTE — Progress Notes (Signed)
HPI  F former smoker followed for OSA, complicated by CHF, Allergic Rhinitis, Asthma, OHS, Morbid Obesity, Bronch 02/21/20- Dr Carlis Abbott- for L mass> Benign, lymphocytes, no malignancy PFT 05/31/20- slight restriction of exhaled volume, Increased DLCO/VA, no resp to BD HST 05/31/20- AHI 13.5/ hr, desaturation to 68% with mean 89%, body weight 285 lbs CPAP  Auto 5-15/ [facility] new 08/08/20  ------------------------------------------------------------   11/08/20- 68 yoF former smoker followed for OSA, complicated by CHF, Allergic Rhinitis, Asthma, OHS, Morbid Obesity,  Bronch 02/21/20- Benign, lymphocytes, no malignancy HST 05/31/20- AHI 13.5/ hr, desaturation to 68% with mean 89%, body weight 285 lbs CPAP  Auto 5-15/ [facility] new 08/08/20  Download- compliance 77%, AHI 5.3/ hr     Pressure range 8-10.6 Body weight today- 301.8 lbs Covid vax-2 Moderna Flu vax-had Bedtime 11PM but says sleep latency may be 2 hours. Sister has commented that she gets sleepy if she sits. Note she is taking Ativan twice daily in the morning and at 3PM.  Mask fit leaks. CXR 05/31/20-  FINDINGS: The heart size and mediastinal contours are within normal limits. Both lungs are clear. The visualized skeletal structures are unremarkable. IMPRESSION: No active cardiopulmonary disease.  06/26/21- 84 yoF former smoker(36 pkyrs) followed for OSA, complicated by CHF, Allergic Rhinitis, Asthma, OHS, Morbid Obesity,  -Symbicort 160, Flonase, Neb Atrovent 0.02%/ Albuterol, Ventolin hfa,  CPAP auto 5-15/ [facility] Download-compliance 73%, AHI 5.2/ hr Body weight today-299 lbs Covid vax-3 Moderna AT score 19 Treated by PCP for pneumonia in June. Tested neg for Covid then and feels back to baseline after several rounds of abx over 2 months.  Recently aware of pain left of lower T-spine. Doesn't think she hurt herself. No urinary symptoms. Would prefer f/u CT scan of chest this time rather than CXR.although nodules appeared benign on 04/17/20 CT.   Doin well with CPAP. Asthma control good with current meds.  ROS-see HPI   + = positive Constitutional:    weight loss, night sweats, fevers, chills, fatigue, lassitude. HEENT:   + headaches, difficulty swallowing,+ tooth/dental problems, sore throat,       sneezing, itching, ear ache, nasal congestion, post nasal drip, snoring CV:    chest pain, orthopnea, PND, +swelling in lower extremities, anasarca,                                   dizziness, palpitations Resp:   +shortness of breath with exertion or at rest.                +productive  non-productive cough, coughing up of blood.              change in color of mucus.  wheezing.   Skin:    rash or lesions. GI:  +heartburn, indigestion, abdominal pain, nausea, vomiting, diarrhea,                 change in bowel habits, loss of appetite GU: dysuria, change in color of urine, no urgency or frequency.   flank pain. MS:   +joint pain, stiffness, decreased range of motion, back pain. Neuro-     nothing unusual Psych:  change in mood or affect.  depression or +anxiety.   memory loss.  OBJ- Physical Exam General- Alert, Oriented, Affect-appropriate, Distress- none acute, + morbidly obese Skin- rash-none, lesions- none, excoriation- none Lymphadenopathy- none Head- atraumatic            Eyes-  Gross vision intact, PERRLA, conjunctivae and secretions clear            Ears- Hearing, canals-normal            Nose- Clear, no-Septal dev, mucus, polyps, erosion, perforation             Throat- Mallampati II-III, mucosa clear , drainage- none, tonsils- atrophic Neck- flexible , trachea midline, no stridor , thyroid nl, carotid no bruit Chest - symmetrical excursion , unlabored           Heart/CV- RRR , no murmur , no gallop  , no rub, nl s1 s2                           - JVD- none , edema- none, stasis changes- none, varices- none           Lung- clear to P&A, wheeze- none, cough- none , dullness-none, rub- none           Chest wall-  Abd-   Br/ Gen/ Rectal- Not done, not indicated Extrem- cyanosis- none, clubbing, none, atrophy- none, strength- nl Neuro- grossly intact to observation

## 2021-06-27 ENCOUNTER — Encounter: Payer: Self-pay | Admitting: Internal Medicine

## 2021-06-27 ENCOUNTER — Other Ambulatory Visit: Payer: Self-pay

## 2021-06-27 ENCOUNTER — Ambulatory Visit: Payer: Medicare HMO | Admitting: Internal Medicine

## 2021-06-27 VITALS — BP 138/76 | HR 82 | Temp 97.5°F | Ht 65.0 in | Wt 299.0 lb

## 2021-06-27 DIAGNOSIS — G4733 Obstructive sleep apnea (adult) (pediatric): Secondary | ICD-10-CM | POA: Diagnosis not present

## 2021-06-27 DIAGNOSIS — R918 Other nonspecific abnormal finding of lung field: Secondary | ICD-10-CM

## 2021-06-27 DIAGNOSIS — R911 Solitary pulmonary nodule: Secondary | ICD-10-CM | POA: Diagnosis not present

## 2021-06-27 DIAGNOSIS — J4530 Mild persistent asthma, uncomplicated: Secondary | ICD-10-CM

## 2021-06-27 MED ORDER — BUDESONIDE-FORMOTEROL FUMARATE 160-4.5 MCG/ACT IN AERO: 1.0000 | INHALATION_SPRAY | Freq: Two times a day (BID) | RESPIRATORY_TRACT | 12 refills | Status: DC

## 2021-06-27 NOTE — Patient Instructions (Addendum)
We can continue CPAP auto 5-15.  Refill sent for Symbicort 160  Order- CT chest, no contrast   - Pneumonia June, 2022, left lateral low thoracic pain, lung nodules  Please call if we can help

## 2021-07-01 ENCOUNTER — Encounter: Payer: Self-pay | Admitting: Internal Medicine

## 2021-07-01 DIAGNOSIS — R918 Other nonspecific abnormal finding of lung field: Secondary | ICD-10-CM | POA: Insufficient documentation

## 2021-07-01 HISTORY — DX: Other nonspecific abnormal finding of lung field: R91.8

## 2021-07-01 NOTE — Assessment & Plan Note (Signed)
Discomfort L paraspinal, low thoracic, after prolonged Rx for CAP Plan- CT chest

## 2021-07-01 NOTE — Assessment & Plan Note (Signed)
PFT well preserved despite 36 pk yr smoking hx. Does feel benefit from bronchodilators. May need to repeat PFT in future. Plan- refill Symbicort

## 2021-07-01 NOTE — Assessment & Plan Note (Signed)
Benefits from CPAP with acceptable compliance and control Plan- continue auto 5-156, discussed goals

## 2021-07-11 DIAGNOSIS — Z800 Family history of malignant neoplasm of digestive organs: Secondary | ICD-10-CM | POA: Diagnosis not present

## 2021-07-11 DIAGNOSIS — R1013 Epigastric pain: Secondary | ICD-10-CM | POA: Diagnosis not present

## 2021-07-11 DIAGNOSIS — R1310 Dysphagia, unspecified: Secondary | ICD-10-CM | POA: Diagnosis not present

## 2021-07-16 ENCOUNTER — Ambulatory Visit
Admission: RE | Admit: 2021-07-16 | Discharge: 2021-07-16 | Disposition: A | Discharge status: Home / Self Care | Payer: Medicare HMO | Source: Ambulatory Visit | Attending: Internal Medicine | Admitting: Internal Medicine

## 2021-07-16 ENCOUNTER — Other Ambulatory Visit: Payer: Self-pay

## 2021-07-16 DIAGNOSIS — I700 Atherosclerosis of aorta: Secondary | ICD-10-CM | POA: Diagnosis not present

## 2021-07-16 DIAGNOSIS — R911 Solitary pulmonary nodule: Secondary | ICD-10-CM

## 2021-07-16 DIAGNOSIS — R918 Other nonspecific abnormal finding of lung field: Secondary | ICD-10-CM | POA: Diagnosis not present

## 2021-07-17 ENCOUNTER — Other Ambulatory Visit: Payer: Self-pay

## 2021-07-17 DIAGNOSIS — R918 Other nonspecific abnormal finding of lung field: Secondary | ICD-10-CM

## 2021-07-21 DIAGNOSIS — J45909 Unspecified asthma, uncomplicated: Secondary | ICD-10-CM | POA: Diagnosis not present

## 2021-07-30 DIAGNOSIS — E785 Hyperlipidemia, unspecified: Secondary | ICD-10-CM | POA: Diagnosis not present

## 2021-07-30 DIAGNOSIS — N1831 Chronic kidney disease, stage 3a: Secondary | ICD-10-CM | POA: Diagnosis not present

## 2021-07-30 DIAGNOSIS — I700 Atherosclerosis of aorta: Secondary | ICD-10-CM | POA: Diagnosis not present

## 2021-07-30 DIAGNOSIS — Z23 Encounter for immunization: Secondary | ICD-10-CM | POA: Diagnosis not present

## 2021-07-30 DIAGNOSIS — I25119 Atherosclerotic heart disease of native coronary artery with unspecified angina pectoris: Secondary | ICD-10-CM | POA: Diagnosis not present

## 2021-07-30 DIAGNOSIS — I110 Hypertensive heart disease with heart failure: Secondary | ICD-10-CM | POA: Diagnosis not present

## 2021-07-30 DIAGNOSIS — I5032 Chronic diastolic (congestive) heart failure: Secondary | ICD-10-CM | POA: Diagnosis not present

## 2021-07-30 DIAGNOSIS — Z6841 Body Mass Index (BMI) 40.0 and over, adult: Secondary | ICD-10-CM | POA: Diagnosis not present

## 2021-08-14 ENCOUNTER — Telehealth: Payer: Self-pay | Admitting: Internal Medicine

## 2021-08-14 NOTE — Telephone Encounter (Signed)
Results faxed via Epic to PCP. Nothing further needed. I sent CT and PFT per her request.

## 2021-08-20 DIAGNOSIS — J45909 Unspecified asthma, uncomplicated: Secondary | ICD-10-CM | POA: Diagnosis not present

## 2021-09-06 DIAGNOSIS — K648 Other hemorrhoids: Secondary | ICD-10-CM | POA: Diagnosis not present

## 2021-09-06 DIAGNOSIS — K5730 Diverticulosis of large intestine without perforation or abscess without bleeding: Secondary | ICD-10-CM | POA: Diagnosis not present

## 2021-09-06 DIAGNOSIS — K640 First degree hemorrhoids: Secondary | ICD-10-CM | POA: Diagnosis not present

## 2021-09-06 DIAGNOSIS — K635 Polyp of colon: Secondary | ICD-10-CM | POA: Diagnosis not present

## 2021-09-06 DIAGNOSIS — D125 Benign neoplasm of sigmoid colon: Secondary | ICD-10-CM | POA: Diagnosis not present

## 2021-09-06 DIAGNOSIS — M797 Fibromyalgia: Secondary | ICD-10-CM | POA: Diagnosis not present

## 2021-09-06 DIAGNOSIS — R1310 Dysphagia, unspecified: Secondary | ICD-10-CM | POA: Diagnosis not present

## 2021-09-06 DIAGNOSIS — Z800 Family history of malignant neoplasm of digestive organs: Secondary | ICD-10-CM | POA: Diagnosis not present

## 2021-09-06 DIAGNOSIS — Z86010 Personal history of colonic polyps: Secondary | ICD-10-CM | POA: Diagnosis not present

## 2021-09-06 DIAGNOSIS — R1013 Epigastric pain: Secondary | ICD-10-CM | POA: Diagnosis not present

## 2021-09-06 DIAGNOSIS — K2950 Unspecified chronic gastritis without bleeding: Secondary | ICD-10-CM | POA: Diagnosis not present

## 2021-09-06 DIAGNOSIS — Z1211 Encounter for screening for malignant neoplasm of colon: Secondary | ICD-10-CM | POA: Diagnosis not present

## 2021-09-06 DIAGNOSIS — G894 Chronic pain syndrome: Secondary | ICD-10-CM | POA: Diagnosis not present

## 2021-09-16 DIAGNOSIS — J45909 Unspecified asthma, uncomplicated: Secondary | ICD-10-CM | POA: Diagnosis not present

## 2021-09-18 DIAGNOSIS — J45909 Unspecified asthma, uncomplicated: Secondary | ICD-10-CM | POA: Diagnosis not present

## 2021-10-07 DIAGNOSIS — B372 Candidiasis of skin and nail: Secondary | ICD-10-CM | POA: Diagnosis not present

## 2021-10-07 DIAGNOSIS — M170 Bilateral primary osteoarthritis of knee: Secondary | ICD-10-CM | POA: Diagnosis not present

## 2021-10-07 DIAGNOSIS — K8020 Calculus of gallbladder without cholecystitis without obstruction: Secondary | ICD-10-CM | POA: Diagnosis not present

## 2021-10-07 DIAGNOSIS — M1991 Primary osteoarthritis, unspecified site: Secondary | ICD-10-CM | POA: Diagnosis not present

## 2021-10-07 DIAGNOSIS — Z6841 Body Mass Index (BMI) 40.0 and over, adult: Secondary | ICD-10-CM | POA: Diagnosis not present

## 2021-10-16 DIAGNOSIS — J45909 Unspecified asthma, uncomplicated: Secondary | ICD-10-CM | POA: Diagnosis not present

## 2021-10-23 ENCOUNTER — Other Ambulatory Visit: Payer: Self-pay

## 2021-10-23 ENCOUNTER — Ambulatory Visit (INDEPENDENT_AMBULATORY_CARE_PROVIDER_SITE_OTHER)
Admission: RE | Admit: 2021-10-23 | Discharge: 2021-10-23 | Disposition: A | Discharge status: Home / Self Care | Payer: Medicare HMO | Source: Ambulatory Visit | Attending: Internal Medicine | Admitting: Internal Medicine

## 2021-10-23 DIAGNOSIS — R918 Other nonspecific abnormal finding of lung field: Secondary | ICD-10-CM

## 2021-10-23 DIAGNOSIS — R911 Solitary pulmonary nodule: Secondary | ICD-10-CM | POA: Diagnosis not present

## 2021-11-14 ENCOUNTER — Telehealth: Payer: Self-pay | Admitting: Internal Medicine

## 2021-11-14 NOTE — Telephone Encounter (Signed)
CT results faxed to PCP via epic.  Patient is aware and voiced her understanding.  Nothing further needed at this time.

## 2021-11-16 DIAGNOSIS — J45909 Unspecified asthma, uncomplicated: Secondary | ICD-10-CM | POA: Diagnosis not present

## 2021-11-20 ENCOUNTER — Encounter: Payer: Self-pay | Admitting: Cardiology

## 2021-11-20 ENCOUNTER — Encounter: Payer: Self-pay | Admitting: *Deleted

## 2021-12-17 DIAGNOSIS — J410 Simple chronic bronchitis: Secondary | ICD-10-CM | POA: Diagnosis not present

## 2021-12-17 DIAGNOSIS — Z20828 Contact with and (suspected) exposure to other viral communicable diseases: Secondary | ICD-10-CM | POA: Diagnosis not present

## 2021-12-17 DIAGNOSIS — I110 Hypertensive heart disease with heart failure: Secondary | ICD-10-CM | POA: Diagnosis not present

## 2021-12-17 DIAGNOSIS — I5032 Chronic diastolic (congestive) heart failure: Secondary | ICD-10-CM | POA: Diagnosis not present

## 2021-12-17 DIAGNOSIS — R69 Illness, unspecified: Secondary | ICD-10-CM | POA: Diagnosis not present

## 2021-12-19 DIAGNOSIS — J45909 Unspecified asthma, uncomplicated: Secondary | ICD-10-CM | POA: Diagnosis not present

## 2021-12-20 ENCOUNTER — Ambulatory Visit: Payer: Medicare HMO | Admitting: Cardiology

## 2022-01-16 DIAGNOSIS — J45909 Unspecified asthma, uncomplicated: Secondary | ICD-10-CM | POA: Diagnosis not present

## 2022-01-29 ENCOUNTER — Encounter: Payer: Self-pay | Admitting: Cardiology

## 2022-01-29 ENCOUNTER — Ambulatory Visit: Payer: Medicare HMO | Admitting: Cardiology

## 2022-01-29 ENCOUNTER — Other Ambulatory Visit: Payer: Self-pay

## 2022-01-29 VITALS — BP 152/92 | HR 85 | Ht 64.5 in | Wt 297.8 lb

## 2022-01-29 DIAGNOSIS — R079 Chest pain, unspecified: Secondary | ICD-10-CM | POA: Diagnosis not present

## 2022-01-29 DIAGNOSIS — G4733 Obstructive sleep apnea (adult) (pediatric): Secondary | ICD-10-CM | POA: Diagnosis not present

## 2022-01-29 DIAGNOSIS — I2510 Atherosclerotic heart disease of native coronary artery without angina pectoris: Secondary | ICD-10-CM

## 2022-01-29 DIAGNOSIS — J4530 Mild persistent asthma, uncomplicated: Secondary | ICD-10-CM

## 2022-01-29 DIAGNOSIS — E785 Hyperlipidemia, unspecified: Secondary | ICD-10-CM

## 2022-01-29 DIAGNOSIS — R918 Other nonspecific abnormal finding of lung field: Secondary | ICD-10-CM

## 2022-01-29 MED ORDER — METOPROLOL TARTRATE 50 MG PO TABS: 50.0000 mg | ORAL_TABLET | Freq: Once | ORAL | 0 refills | Status: DC

## 2022-01-29 NOTE — Patient Instructions (Signed)
Medication Instructions:  ?Your physician recommends that you continue on your current medications as directed. Please refer to the Current Medication list given to you today. ? ?*If you need a refill on your cardiac medications before your next appointment, please call your pharmacy* ? ? ?Lab Work: ? ?BMP 1 week before CT ? ?If you have labs (blood work) drawn today and your tests are completely normal, you will receive your results only by: ?MyChart Message (if you have MyChart) OR ?A paper copy in the mail ?If you have any lab test that is abnormal or we need to change your treatment, we will call you to review the results. ? ? ?Testing/Procedures: ? ? ?Your cardiac CT will be scheduled at one of the below locations:  ? ?[facility] ?[address] ?[place], [place] [address] ?[phone] ? ?OR ? ?[place] [address] ?[place],  [address] ?[phone] ? ?If scheduled at [facility], please arrive at the [facility] and Children's Entrance (Entrance C2) of [facility] 30 minutes prior to test start time. ?You can use the FREE valet parking offered at entrance C (encouraged to control the heart rate for the test)  ?Proceed to the [facility] Radiology Department (first floor) to check-in and test prep. ? ?All radiology patients and guests should use entrance C2 at [facility], accessed from [facility], even though the hospital's physical address listed is [address] ? ? ? ?If scheduled at [facility], please arrive 15 mins early for check-in and test prep. ? ?Please follow these instructions carefully (unless otherwise directed): ? ?On the Night Before the Test: ?Be sure to Drink plenty of water. ?Do not consume any caffeinated/decaffeinated beverages or chocolate 12 hours prior to your test. ?Do not take any antihistamines 12 hours prior to your test. ? ?On the Day of  the Test: ?Drink plenty of water until 1 hour prior to the test. ?Do not eat any food 4 hours prior to the test. ?You may take your regular medications prior to the test.  ?Take metoprolol (Lopressor) two hours prior to test. ?FEMALES- please wear underwire-free bra if available, avoid dresses & tight clothing  ? ?     ?After the Test: ?Drink plenty of water. ?After receiving IV contrast, you may experience a mild flushed feeling. This is normal. ?On occasion, you may experience a mild rash up to 24 hours after the test. This is not dangerous. If this occurs, you can take Benadryl 25 mg and increase your fluid intake. ?If you experience trouble breathing, this can be serious. If it is severe call 911 IMMEDIATELY. If it is mild, please call our office. ? ?We will call to schedule your test 2-4 weeks out understanding that some insurance companies will need an authorization prior to the service being performed.  ? ?For non-scheduling related questions, please contact the cardiac imaging nurse navigator should you have any questions/concerns: ?Marchia Bond, Cardiac Imaging Nurse Navigator ?Gordy Clement, Cardiac Imaging Nurse Navigator ?[facility] ?Direct Office Dial: [phone]  ? ?For scheduling needs, including cancellations and rescheduling, please call [place], [phone]. ? ? ? ?Follow-Up: ?At [facility], you and your health needs are our priority.  As part of our continuing mission to provide you with exceptional heart care, we have created designated Provider Care Teams.  These Care Teams include your primary Cardiologist (physician) and Advanced Practice Providers (APPs -  Physician Assistants and Nurse Practitioners) who all work together to provide you with the care you need, when you need it. ? ?We recommend signing up for the patient portal called "MyChart".  Sign up information is provided on this After Visit Summary.  MyChart is used to connect with patients for  Virtual Visits (Telemedicine).  Patients are able to view lab/test results, encounter notes, upcoming appointments, etc.  Non-urgent messages can be sent to your provider as well.   ?To learn more about what you can do with MyChart, go to [email or web address].   ? ?Your next appointment:   ?8 week(s) ? ?The format for your next appointment:   ?In Person ? ?Provider:   ?Shirlee More, MD  ? ? ?Other Instructions ?None ? ?

## 2022-01-29 NOTE — Progress Notes (Signed)
?Cardiology Office Note:   ? ?Date:  01/29/2022  ? ?ID:  KRITIKA STUKES, DOB 16-Feb-1951, MRN [id number] ? ?PCP:  Street, Sharon Mt, MD  ?Cardiologist:  Shirlee More, MD  ? ?Referring MD: Street, Sharon Mt, * ? ?ASSESSMENT:   ? ?1. Chest pain, unspecified type   ?2. Coronary artery calcification seen on CAT scan   ?3. Multiple lung nodules   ?4. Mild persistent asthma, unspecified whether complicated   ?5. OSA (obstructive sleep apnea)   ?6. Hyperlipidemia, unspecified hyperlipidemia type   ? ?PLAN:   ? ?In order of problems listed above: ? ?Still has difficult what to do with individuals with coronary artery calcification, they benefit from lipid-lowering with statin she has taken 1 in excess of 20 years in the absence of obstructive CAD there is no benefit of aspirin or antianginal medications.  Although her respiratory symptoms severe shortness of breath chest tightness could be Pulmonary may well be cardiac ischemia.  We discussed further evaluation I think she should undergo cardiac CTA which will guide further evaluation and treatment with significant obstructive CAD she has no dye allergy her creatinine seems to be near normal we will recheck prior to the test and she is not bronchospastic and I think she would tolerate a beta-blocker. ?Stable lung issues followed by pulmonary in [place] ?Continue her statin ? ?Next appointment 8 weeks following her cardiac CTA ? ? ?Medication Adjustments/Labs and Tests Ordered: ?Current medicines are reviewed at length with the patient today.  Concerns regarding medicines are outlined above.  ?Orders Placed This Encounter  ?Procedures  ? CT CORONARY MORPH W/CTA COR W/SCORE W/CA W/CM &/OR WO/CM  ? Basic Metabolic Panel (BMET)  ? EKG 12-Lead  ? ?Meds ordered this encounter  ?Medications  ? metoprolol tartrate (LOPRESSOR) 50 MG tablet  ?  Sig: Take 1 tablet (50 mg total) by mouth once for 1 dose. Please take 2 hours before CT  ?  Dispense:  1 tablet  ?  Refill:  0  ?   ? ?Chief Complaint  ?Patient presents with  ? Follow-up  ?  She had a CT scan showing coronary artery calcification aortic atherosclerosis  ? ? ?History of Present Illness:   ? ?Sherry Riley is a 71 y.o. female who is being seen today for the evaluation of coronary artery calcification on CT at the request of Street, Sharon Mt, *.  Chart review summarized below no previous cardiac evaluation consultation. ? ?His CT of the chest performed December 2022 showing coronary and aortic atherosclerosis.  PCP records related she had a myocardial perfusion study in 2015 no ischemia she had an apical fixed defect.  Researched all of her CT reports dating back to April 2021 and she had coronary calcification and aortic sclerosis with all of the Cts. ? ?She was seen by Dr. Annamaria Boots pulmonary medicine 06/27/2021 for obstructive sleep apnea complicated by congestive heart failure morbid obesity and pulmonary diagnosis of asthma. ? ?An echocardiogram performed 02/10/2020 technically difficult low normal ejection fraction 50 to 21% grade 1 diastolic dysfunction normal right ventricular size and function, trivial aortic valvular regurgitation. ?Her EKG at that time 02/21/2020 showed sinus rhythm artifact poor R wave progression possible anterior septal MI. ? ?Her daughter is present Psychologist, sport and exercise and she participates in evaluation decision making ?Although her diagnosis is asthma she has a 30-pack-year cigarette smoking history and her CT scan shows small nodules and mild fibrosis. ?She is short of breath and has to rest with ADLs  and light household activities ?She has no edema orthopnea chest pain palpitation or syncope. ?Her daughter and patient are unsure whether the diagnosis of heart failure has come from. ?With physical activity she becomes fairly short of breath and at times has tightness in her chest which at times forces her to stop and rest.  She has 1 of these about once a week and the pattern is stable she does  not have rest or nocturnal symptoms. ?She has taken a statin in excess of 20 years ?Past Medical History:  ?Diagnosis Date  ? Abscess of lower back 06/02/2017  ? Acute diastolic CHF (congestive heart failure) ([place]) 02/10/2020  ? Acute respiratory failure with hypoxia ([place]) 02/10/2020  ? Allergic rhinitis 02/09/2020  ? Asthma   ? Atherosclerosis of aorta ([place])   ? CKD (chronic kidney disease), symptom management only, stage 3 (moderate) (HCC)   ? Community acquired pneumonia of left lower lobe of lung 02/29/2020  ? Bronch 02/21/20- Dr Carlis Abbott- for L mass> Benign, lymphocytes, no malignancy  ? Complication of anesthesia   ? Hard to wake up.  ? Degenerative disc disease, lumbar 06/09/2013  ? Depression   ? Diastolic dysfunction   ? Left ventricle; noted on echo in 2021, EF low normal, LV dilated  ? Dysthymic disorder   ? Facet hypertrophy of lumbar region 06/09/2013  ? Fibromyalgia   ? Generalized hyperhidrosis   ? GERD (gastroesophageal reflux disease)   ? High blood pressure   ? High cholesterol   ? History of abscess of skin and subcutaneous tissue 08/13/2017  ? Hypertensive heart disease with chronic diastolic congestive heart failure ([place])   ? Irritable bowel syndrome   ? Multiple lung nodules 07/01/2021  ? Bronch 02/21/20- Benign, lymphocytes, no malignancy CT chest 04/17/20- c/w benign  ? Obesity, Class III, BMI 40-49.9 (morbid obesity) ([place]) 02/09/2020  ? OSA (obstructive sleep apnea) 02/10/2020  ? HST 05/31/20- AHI 13.5/ hr, desaturation to 68% with mean 89%, body weight 285 lbs  ? Osteoarthritis 06/02/2017  ? Pneumonia   ? Pre-diabetes   ? Unspecified rotator cuff tear or rupture of left shoulder, not specified as traumatic 02/07/2020  ? ? ?Past Surgical History:  ?Procedure Laterality Date  ? ARTHROSCOPY KNEE W/ DRILLING Right   ? BIOPSY  02/21/2020  ? Procedure: BIOPSY;  Surgeon: Julian Hy, DO;  Location: [facility];  Service: Cardiopulmonary;;  ? CYST EXCISION  2018  ? Back cyst  ? ENDOBRONCHIAL ULTRASOUND   02/21/2020  ? Procedure: ENDOBRONCHIAL ULTRASOUND;  Surgeon: Julian Hy, DO;  Location: [facility];  Service: Cardiopulmonary;;  ? FINE NEEDLE ASPIRATION  02/21/2020  ? Procedure: FINE NEEDLE ASPIRATION (FNA) LINEAR;  Surgeon: Julian Hy, DO;  Location: [facility];  Service: Cardiopulmonary;;  ? HERNIA REPAIR  2015  ? SHOULDER ARTHROSCOPY WITH BANKART REPAIR Left 02/07/2020  ? Procedure: SHOULDER ARTHROSCOPY WITH BANKART REPAIR;  Surgeon: Renette Butters, MD;  Location: [facility];  Service: Orthopedics;  Laterality: Left;  ? VIDEO BRONCHOSCOPY  02/21/2020  ? Procedure: VIDEO BRONCHOSCOPY WITH FLUORO;  Surgeon: Julian Hy, DO;  Location: [facility];  Service: Cardiopulmonary;;  ? ? ?Current Medications: ?Current Meds  ?Medication Sig  ? azelastine (ASTELIN) 0.1 % nasal spray Place 2 sprays into both nostrils daily as needed for rhinitis. Use in each nostril as directed  ? budesonide-formoterol (SYMBICORT) 160-4.5 MCG/ACT inhaler Inhale 1 puff into the lungs 2 (two) times daily.  ? Calcium-Magnesium-Vitamin D (CALCIUM MAGNESIUM PO) Take 1,000 mg  by mouth 4 (four) times daily.   ? Cinnamon 500 MG TABS Take 1,000 mg by mouth 4 (four) times daily.  ? cloNIDine (CATAPRES) 0.1 MG tablet Take 1 tablet (0.1 mg total) by mouth 2 (two) times daily.  ? fluticasone (FLONASE) 50 MCG/ACT nasal spray Place 1 spray into both nostrils daily.  ? gabapentin (NEURONTIN) 400 MG capsule Take 400 mg by mouth 3 (three) times daily.  ? gabapentin (NEURONTIN) 600 MG tablet Take 600 mg by mouth 3 (three) times daily.  ? loratadine (CLARITIN) 10 MG tablet Take 10 mg by mouth daily.  ? LORazepam (ATIVAN) 0.5 MG tablet Take 0.5 mg by mouth 2 (two) times daily as needed for anxiety.  ? meclizine (ANTIVERT) 12.5 MG tablet Take 12.5 mg by mouth every 8 (eight) hours as needed for dizziness.  ? metoprolol tartrate (LOPRESSOR) 50 MG tablet Take 1 tablet (50 mg total) by mouth once for 1 dose. Please take 2 hours before CT  ? Misc Natural  Products (OSTEO BI-FLEX JOINT SHIELD PO) Take 1 tablet by mouth in the morning and at bedtime.   ? Omega-3 Fatty Acids (FISH OIL) 1000 MG CAPS Take 2 capsules by mouth 2 (two) times daily.  ? omeprazol

## 2022-02-11 ENCOUNTER — Telehealth (HOSPITAL_COMMUNITY): Payer: Self-pay | Admitting: *Deleted

## 2022-02-11 NOTE — Telephone Encounter (Signed)
Attempted to call patient regarding upcoming cardiac CT appointment. °Left message on voicemail with name and callback number ° °Sollie Vultaggio RN Navigator Cardiac Imaging °[facility] °[phone] Office °[phone] Cell ° °

## 2022-02-11 NOTE — Telephone Encounter (Signed)
Reaching out to patient to offer assistance regarding upcoming cardiac imaging study; pt verbalizes understanding of appt date/time, parking situation and where to check in, pre-test NPO status and medications ordered, and verified current allergies; name and call back number provided for further questions should they arise ? ?Gordy Clement RN Navigator Cardiac Imaging ?[facility] ?[phone] office ?[phone] cell ? ?Patient to take 50mg  metoprolol tartrate two hours prior to her cardiac CT scan.  She is aware to arrive at 10:30am. ?

## 2022-02-12 DIAGNOSIS — R079 Chest pain, unspecified: Secondary | ICD-10-CM | POA: Diagnosis not present

## 2022-02-13 ENCOUNTER — Ambulatory Visit (HOSPITAL_COMMUNITY)
Admission: RE | Admit: 2022-02-13 | Discharge: 2022-02-13 | Disposition: A | Discharge status: Home / Self Care | Payer: Medicare HMO | Source: Ambulatory Visit | Attending: Cardiology | Admitting: Cardiology

## 2022-02-13 ENCOUNTER — Other Ambulatory Visit: Payer: Self-pay | Admitting: Cardiology

## 2022-02-13 DIAGNOSIS — R072 Precordial pain: Secondary | ICD-10-CM

## 2022-02-13 DIAGNOSIS — R931 Abnormal findings on diagnostic imaging of heart and coronary circulation: Secondary | ICD-10-CM | POA: Diagnosis not present

## 2022-02-13 DIAGNOSIS — R079 Chest pain, unspecified: Secondary | ICD-10-CM | POA: Diagnosis present

## 2022-02-13 DIAGNOSIS — I2510 Atherosclerotic heart disease of native coronary artery without angina pectoris: Secondary | ICD-10-CM | POA: Diagnosis not present

## 2022-02-13 LAB — BASIC METABOLIC PANEL
BUN/Creatinine Ratio: 25 (ref 12–28)
BUN: 25 mg/dL (ref 8–27)
CO2: 25 mmol/L (ref 20–29)
Calcium: 10 mg/dL (ref 8.7–10.3)
Chloride: 101 mmol/L (ref 96–106)
Creatinine, Ser: 1.01 mg/dL — ABNORMAL HIGH (ref 0.57–1.00)
Glucose: 96 mg/dL (ref 70–99)
Potassium: 4.7 mmol/L (ref 3.5–5.2)
Sodium: 140 mmol/L (ref 134–144)
eGFR: 60 mL/min/{1.73_m2} (ref >59)

## 2022-02-13 MED ORDER — IOHEXOL 350 MG/ML SOLN
100.0000 mL | Freq: Once | INTRAVENOUS | Status: AC | PRN
Start: 2022-02-13 — End: 2022-02-13
  Administered 2022-02-13: 100 mL via INTRAVENOUS

## 2022-02-13 MED ORDER — NITROGLYCERIN 0.4 MG SL SUBL
SUBLINGUAL_TABLET | SUBLINGUAL | Status: AC
  Filled 2022-02-13: qty 2

## 2022-02-13 MED ORDER — NITROGLYCERIN 0.4 MG SL SUBL
0.8000 mg | SUBLINGUAL_TABLET | Freq: Once | SUBLINGUAL | Status: AC
Start: 2022-02-13 — End: 2022-02-13
  Administered 2022-02-13: 0.8 mg via SUBLINGUAL

## 2022-02-13 NOTE — Progress Notes (Unsigned)
o

## 2022-02-14 ENCOUNTER — Ambulatory Visit (HOSPITAL_COMMUNITY)
Admission: RE | Admit: 2022-02-14 | Discharge: 2022-02-14 | Disposition: A | Discharge status: Home / Self Care | Payer: Medicare HMO | Source: Ambulatory Visit | Attending: Cardiology | Admitting: Cardiology

## 2022-02-14 DIAGNOSIS — R072 Precordial pain: Secondary | ICD-10-CM | POA: Diagnosis not present

## 2022-02-14 DIAGNOSIS — R931 Abnormal findings on diagnostic imaging of heart and coronary circulation: Secondary | ICD-10-CM | POA: Diagnosis not present

## 2022-02-14 DIAGNOSIS — I2510 Atherosclerotic heart disease of native coronary artery without angina pectoris: Secondary | ICD-10-CM | POA: Diagnosis not present

## 2022-02-14 DIAGNOSIS — R079 Chest pain, unspecified: Secondary | ICD-10-CM | POA: Diagnosis not present

## 2022-02-16 DIAGNOSIS — J45909 Unspecified asthma, uncomplicated: Secondary | ICD-10-CM | POA: Diagnosis not present

## 2022-03-27 DIAGNOSIS — J45909 Unspecified asthma, uncomplicated: Secondary | ICD-10-CM | POA: Diagnosis not present

## 2022-03-28 ENCOUNTER — Ambulatory Visit (INDEPENDENT_AMBULATORY_CARE_PROVIDER_SITE_OTHER): Payer: Medicare HMO | Admitting: Cardiology

## 2022-03-28 ENCOUNTER — Encounter: Payer: Self-pay | Admitting: Cardiology

## 2022-03-28 VITALS — BP 148/94 | HR 80 | Ht 64.5 in | Wt 291.4 lb

## 2022-03-28 DIAGNOSIS — I2510 Atherosclerotic heart disease of native coronary artery without angina pectoris: Secondary | ICD-10-CM

## 2022-03-28 DIAGNOSIS — R931 Abnormal findings on diagnostic imaging of heart and coronary circulation: Secondary | ICD-10-CM | POA: Diagnosis not present

## 2022-03-28 MED ORDER — VERAPAMIL HCL ER 180 MG PO TBCR: 180.0000 mg | EXTENDED_RELEASE_TABLET | Freq: Every day | ORAL | 3 refills | Status: DC

## 2022-03-28 MED ORDER — ASPIRIN 81 MG PO TBEC: 81.0000 mg | DELAYED_RELEASE_TABLET | Freq: Every day | ORAL | 3 refills | Status: AC

## 2022-03-28 MED ORDER — NITROGLYCERIN 0.4 MG SL SUBL: 0.4000 mg | SUBLINGUAL_TABLET | SUBLINGUAL | 2 refills | Status: DC | PRN

## 2022-03-28 NOTE — Patient Instructions (Signed)
Medication Instructions:  Your physician has recommended you make the following change in your medication:   START: Aspirin 81 mg daily START: Verapamil 180 mg daily START: Nitroglycerin 0.4 mg under the tongue every 5 minutes as needed for chest pain  *If you need a refill on your cardiac medications before your next appointment, please call your pharmacy*   Lab Work: None If you have labs (blood work) drawn today and your tests are completely normal, you will receive your results only by: [place] (if you have MyChart) OR A paper copy in the mail If you have any lab test that is abnormal or we need to change your treatment, we will call you to review the results.   Testing/Procedures: None   Follow-Up: At [facility], you and your health needs are our priority.  As part of our continuing mission to provide you with exceptional heart care, we have created designated Provider Care Teams.  These Care Teams include your primary Cardiologist (physician) and Advanced Practice Providers (APPs -  Physician Assistants and Nurse Practitioners) who all work together to provide you with the care you need, when you need it.  We recommend signing up for the patient portal called "MyChart".  Sign up information is provided on this After Visit Summary.  MyChart is used to connect with patients for Virtual Visits (Telemedicine).  Patients are able to view lab/test results, encounter notes, upcoming appointments, etc.  Non-urgent messages can be sent to your provider as well.   To learn more about what you can do with MyChart, go to [email or web address].    Your next appointment:   1 year(s)  The format for your next appointment:   In Person  Provider:   Shirlee More, MD    Other Instructions None  Important Information About Sugar

## 2022-03-28 NOTE — Addendum Note (Signed)
Addended by: Edwyna Shell I on: 03/28/2022 11:15 AM   Modules accepted: Orders

## 2022-03-28 NOTE — Progress Notes (Signed)
Cardiology Office Note:    Date:  03/28/2022   ID:  [name], DOB [birth year], MRN [id number]  PCP:  Street, Sharon Mt, MD  Cardiologist:  Shirlee More, MD    Referring MD: [address], Sharon Mt, *    ASSESSMENT:    1. Coronary artery disease involving native coronary artery of native heart, unspecified whether angina present   2. Agatston coronary artery calcium score between 200 and 399    PLAN:    In order of problems listed above:  She has coronary artery disease does not have flow-limiting stenosis or high risk CTA continue medical therapy with her multiple antihypertensives statin start aspirin and initiate rate limiting calcium channel blocker I will also aid with her hypertension given a prescription for nitroglycerin if needed I will plan to see him in 1 year Lipids are followed with her PCP if LDL is above 70 I will transition her to high intensity statin like rosuvastatin or she could use combination pravastatin with Zetia   Next appointment: 1 year   Medication Adjustments/Labs and Tests Ordered: Current medicines are reviewed at length with the patient today.  Concerns regarding medicines are outlined above.  No orders of the defined types were placed in this encounter.  No orders of the defined types were placed in this encounter.   Chief Complaint  Patient presents with   Follow-up   Chest Pain    History of Present Illness:    Sherry Riley is a 71 y.o. female with a hx of coronary artery calcification on CT scan multiple pulmonary nodules obstructive sleep apnea and asthma and hyperlipidemia last seen here 01/29/2022 with chest pain.  Compliance with diet, lifestyle and medications: Yes  She has good healthcare literacy understands the meaning of her coronary CTA and calcium score She is not having angina We will optimize therapy by placing her on low-dose aspirin and adding a rate limiting calcium channel blocker I think a beta-blocker be  detrimental with her lung disease I will also give her prescription for nitroglycerin if needed She is very concerned about her weight to engage me in a discussion I asked her to discuss with her family doctor GLP-1 agent like Ozempic or Victoza for weight loss She has stable shortness of breath no edema palpitation or syncope  She had a cardiac CTA performed reported 02/10/2022  Her coronary score was elevated 252/86 percentile and she had mild nonobstructive CAD.  There was technical limitation in the first diagonal branch and proximal LAD were limited in visualization FFR determination was normal Past Medical History:  Diagnosis Date   Abscess of lower back 40/81/4481   Acute diastolic CHF (congestive heart failure) ([place]) 02/10/2020   Acute respiratory failure with hypoxia (HCC) 02/10/2020   Allergic rhinitis 02/09/2020   Asthma    Atherosclerosis of aorta (HCC)    CKD (chronic kidney disease), symptom management only, stage 3 (moderate) ([place])    Community acquired pneumonia of left lower lobe of lung 02/29/2020   Bronch 02/21/20- Dr Carlis Abbott- for L mass> Benign, lymphocytes, no malignancy   Complication of anesthesia    Hard to wake up.   Degenerative disc disease, lumbar 06/09/2013   Depression    Diastolic dysfunction    Left ventricle; noted on echo in 2021, EF low normal, LV dilated   Dysthymic disorder    Facet hypertrophy of lumbar region 06/09/2013   Fibromyalgia    Generalized hyperhidrosis    GERD (gastroesophageal reflux disease)  High blood pressure    High cholesterol    History of abscess of skin and subcutaneous tissue 08/13/2017   Hypertensive heart disease with chronic diastolic congestive heart failure (HCC)    Irritable bowel syndrome    Multiple lung nodules 07/01/2021   Bronch 02/21/20- Benign, lymphocytes, no malignancy CT chest 04/17/20- c/w benign   Obesity, Class III, BMI 40-49.9 (morbid obesity) ([place]) 02/09/2020   OSA (obstructive sleep apnea) 02/10/2020    HST 05/31/20- AHI 13.5/ hr, desaturation to 68% with mean 89%, body weight 285 lbs   Osteoarthritis 06/02/2017   Pneumonia    Pre-diabetes    Unspecified rotator cuff tear or rupture of left shoulder, not specified as traumatic 02/07/2020    Past Surgical History:  Procedure Laterality Date   ARTHROSCOPY KNEE W/ DRILLING Right    BIOPSY  02/21/2020   Procedure: BIOPSY;  Surgeon: Julian Hy, DO;  Location: [facility];  Service: Cardiopulmonary;;   CYST EXCISION  2018   Back cyst   ENDOBRONCHIAL ULTRASOUND  02/21/2020   Procedure: ENDOBRONCHIAL ULTRASOUND;  Surgeon: Julian Hy, DO;  Location: [facility];  Service: Cardiopulmonary;;   FINE NEEDLE ASPIRATION  02/21/2020   Procedure: FINE NEEDLE ASPIRATION (FNA) LINEAR;  Surgeon: Julian Hy, DO;  Location: [facility];  Service: Cardiopulmonary;;   HERNIA REPAIR  2015   SHOULDER ARTHROSCOPY WITH BANKART REPAIR Left 02/07/2020   Procedure: SHOULDER ARTHROSCOPY WITH BANKART REPAIR;  Surgeon: Renette Butters, MD;  Location: [facility];  Service: Orthopedics;  Laterality: Left;   VIDEO BRONCHOSCOPY  02/21/2020   Procedure: VIDEO BRONCHOSCOPY WITH FLUORO;  Surgeon: Julian Hy, DO;  Location: [facility];  Service: Cardiopulmonary;;    Current Medications: Current Meds  Medication Sig   azelastine (ASTELIN) 0.1 % nasal spray Place 2 sprays into both nostrils daily as needed for rhinitis. Use in each nostril as directed   budesonide-formoterol (SYMBICORT) 160-4.5 MCG/ACT inhaler Inhale 1 puff into the lungs 2 (two) times daily.   Calcium-Magnesium-Vitamin D (CALCIUM MAGNESIUM PO) Take 1,000 mg by mouth 4 (four) times daily.    Cinnamon 500 MG TABS Take 1,000 mg by mouth 4 (four) times daily.   cloNIDine (CATAPRES) 0.1 MG tablet Take 1 tablet (0.1 mg total) by mouth 2 (two) times daily.   fluticasone (FLONASE) 50 MCG/ACT nasal spray Place 1 spray into both nostrils daily.   gabapentin (NEURONTIN) 400 MG capsule Take 400 mg by  mouth 3 (three) times daily.   gabapentin (NEURONTIN) 600 MG tablet Take 600 mg by mouth 3 (three) times daily.   loratadine (CLARITIN) 10 MG tablet Take 10 mg by mouth daily.   LORazepam (ATIVAN) 0.5 MG tablet Take 0.5 mg by mouth 2 (two) times daily as needed for anxiety.   meclizine (ANTIVERT) 12.5 MG tablet Take 12.5 mg by mouth every 8 (eight) hours as needed for dizziness.   Misc Natural Products (OSTEO BI-FLEX JOINT SHIELD PO) Take 1 tablet by mouth in the morning and at bedtime.    Omega-3 Fatty Acids (FISH OIL) 1000 MG CAPS Take 2 capsules by mouth 2 (two) times daily.   omeprazole (PRILOSEC) 40 MG capsule Take 40 mg by mouth daily.   polyethylene glycol (MIRALAX / GLYCOLAX) 17 g packet Take 17 g by mouth daily.   pravastatin (PRAVACHOL) 40 MG tablet Take 40 mg by mouth at bedtime.   venlafaxine XR (EFFEXOR-XR) 75 MG 24 hr capsule Take 75 mg by mouth daily with breakfast.     Allergies:  Atorvastatin, Lyrica [pregabalin], Actonel [risedronate sodium], Coreg [carvedilol], Hydralazine hcl, Pravastatin, Wellbutrin [bupropion], Ace inhibitors, Diltiazem hcl, Diovan hct [valsartan-hydrochlorothiazide], Doxycycline, and Valsartan   Social History   Socioeconomic History   Marital status: Single    Spouse name: Not on file   Number of children: Not on file   Years of education: Not on file   Highest education level: Not on file  Occupational History   Not on file  Tobacco Use   Smoking status: Former    Years: 36.00    Types: Cigarettes    Start date: 24    Quit date: 2011    Years since quitting: 12.3    Passive exposure: Past   Smokeless tobacco: Never   Tobacco comments:    one-half to one pack per day  Vaping Use   Vaping Use: Never used  Substance and Sexual Activity   Alcohol use: No   Drug use: No   Sexual activity: Not on file  Other Topics Concern   Not on file  Social History Narrative   Not on file   Social Determinants of Health   Financial Resource  Strain: Not on file  Food Insecurity: Not on file  Transportation Needs: Not on file  Physical Activity: Not on file  Stress: Not on file  Social Connections: Not on file     Family History: The patient's family history includes Asthma in her mother; Diabetes in her brother and mother; Heart attack in her mother; Hypertension in her mother; Lung cancer in her father. ROS:   Please see the history of present illness.    All other systems reviewed and are negative.  EKGs/Labs/Other Studies Reviewed:    The following studies were reviewed today:    Recent Labs: 02/12/2022: BUN 25; Creatinine, Ser 1.01; Potassium 4.7; Sodium 140  Recent Lipid Panel 05/30/2021 cholesterol 169 triglycerides 178  Physical Exam:    VS:  BP (!) 148/94 (BP Location: Right Arm, Patient Position: Sitting)   Pulse 80   Ht 5' 4.5" (1.638 m)   Wt 291 lb 6.4 oz (132.2 kg)   SpO2 99%   BMI 49.25 kg/m     Wt Readings from Last 3 Encounters:  03/28/22 291 lb 6.4 oz (132.2 kg)  01/29/22 297 lb 12.8 oz (135.1 kg)  10/07/21 299 lb (135.6 kg)     GEN: Obese BMI approaches 50 well nourished, well developed in no acute distress HEENT: Normal NECK: No JVD; No carotid bruits LYMPHATICS: No lymphadenopathy CARDIAC: RRR, no murmurs, rubs, gallops RESPIRATORY:  Clear to auscultation without rales, wheezing or rhonchi  ABDOMEN: Soft, non-tender, non-distended MUSCULOSKELETAL:  No edema; No deformity  SKIN: Warm and dry NEUROLOGIC:  Alert and oriented x 3 PSYCHIATRIC:  Normal affect    Signed, Shirlee More, MD  03/28/2022 11:02 AM    [place]

## 2022-03-31 DIAGNOSIS — B9789 Other viral agents as the cause of diseases classified elsewhere: Secondary | ICD-10-CM | POA: Diagnosis not present

## 2022-03-31 DIAGNOSIS — Z20822 Contact with and (suspected) exposure to covid-19: Secondary | ICD-10-CM | POA: Diagnosis not present

## 2022-03-31 DIAGNOSIS — J019 Acute sinusitis, unspecified: Secondary | ICD-10-CM | POA: Diagnosis not present

## 2022-04-09 DIAGNOSIS — J40 Bronchitis, not specified as acute or chronic: Secondary | ICD-10-CM | POA: Diagnosis not present

## 2022-04-09 DIAGNOSIS — R059 Cough, unspecified: Secondary | ICD-10-CM | POA: Diagnosis not present

## 2022-04-09 DIAGNOSIS — J329 Chronic sinusitis, unspecified: Secondary | ICD-10-CM | POA: Diagnosis not present

## 2022-04-09 DIAGNOSIS — R7301 Impaired fasting glucose: Secondary | ICD-10-CM | POA: Diagnosis not present

## 2022-04-17 DIAGNOSIS — I25119 Atherosclerotic heart disease of native coronary artery with unspecified angina pectoris: Secondary | ICD-10-CM | POA: Diagnosis not present

## 2022-04-17 DIAGNOSIS — R7301 Impaired fasting glucose: Secondary | ICD-10-CM | POA: Diagnosis not present

## 2022-04-17 DIAGNOSIS — Z79899 Other long term (current) drug therapy: Secondary | ICD-10-CM | POA: Diagnosis not present

## 2022-04-17 DIAGNOSIS — N1831 Chronic kidney disease, stage 3a: Secondary | ICD-10-CM | POA: Diagnosis not present

## 2022-04-17 DIAGNOSIS — I771 Stricture of artery: Secondary | ICD-10-CM | POA: Diagnosis not present

## 2022-04-17 DIAGNOSIS — J410 Simple chronic bronchitis: Secondary | ICD-10-CM | POA: Diagnosis not present

## 2022-04-17 DIAGNOSIS — Z0000 Encounter for general adult medical examination without abnormal findings: Secondary | ICD-10-CM | POA: Diagnosis not present

## 2022-04-17 DIAGNOSIS — I5032 Chronic diastolic (congestive) heart failure: Secondary | ICD-10-CM | POA: Diagnosis not present

## 2022-04-17 DIAGNOSIS — E785 Hyperlipidemia, unspecified: Secondary | ICD-10-CM | POA: Diagnosis not present

## 2022-04-17 DIAGNOSIS — I110 Hypertensive heart disease with heart failure: Secondary | ICD-10-CM | POA: Diagnosis not present

## 2022-04-17 DIAGNOSIS — I700 Atherosclerosis of aorta: Secondary | ICD-10-CM | POA: Diagnosis not present

## 2022-04-17 DIAGNOSIS — I519 Heart disease, unspecified: Secondary | ICD-10-CM | POA: Diagnosis not present

## 2022-04-27 DIAGNOSIS — J45909 Unspecified asthma, uncomplicated: Secondary | ICD-10-CM | POA: Diagnosis not present

## 2022-05-02 ENCOUNTER — Telehealth: Payer: Self-pay

## 2022-05-02 NOTE — Telephone Encounter (Signed)
Spoke with daughter-in -Landscape architect. She stated that the pt had an OV with Dr. Dulce Sellar and had discussed the CT in depth. They had no further questions.

## 2022-05-27 DIAGNOSIS — J45909 Unspecified asthma, uncomplicated: Secondary | ICD-10-CM | POA: Diagnosis not present

## 2022-05-29 DIAGNOSIS — R7301 Impaired fasting glucose: Secondary | ICD-10-CM | POA: Diagnosis not present

## 2022-05-29 DIAGNOSIS — J329 Chronic sinusitis, unspecified: Secondary | ICD-10-CM | POA: Diagnosis not present

## 2022-05-29 DIAGNOSIS — J40 Bronchitis, not specified as acute or chronic: Secondary | ICD-10-CM | POA: Diagnosis not present

## 2022-06-18 DIAGNOSIS — B3781 Candidal esophagitis: Secondary | ICD-10-CM | POA: Diagnosis not present

## 2022-06-18 DIAGNOSIS — B370 Candidal stomatitis: Secondary | ICD-10-CM | POA: Diagnosis not present

## 2022-06-18 DIAGNOSIS — R69 Illness, unspecified: Secondary | ICD-10-CM | POA: Diagnosis not present

## 2022-06-18 DIAGNOSIS — Z6841 Body Mass Index (BMI) 40.0 and over, adult: Secondary | ICD-10-CM | POA: Diagnosis not present

## 2022-06-27 ENCOUNTER — Ambulatory Visit: Payer: Medicare HMO | Admitting: Internal Medicine

## 2022-07-01 ENCOUNTER — Encounter: Payer: Self-pay | Admitting: Internal Medicine

## 2022-07-02 NOTE — Progress Notes (Signed)
HPI  F former smoker followed for OSA, complicated by CHF, Allergic Rhinitis, Asthma, OHS, Morbid Obesity, Bronch 02/21/20- Dr Carlis Abbott- for L mass> Benign, lymphocytes, no malignancy PFT 05/31/20- slight restriction of exhaled volume, Increased DLCO/VA, no resp to BD HST 05/31/20- AHI 13.5/ hr, desaturation to 68% with mean 89%, body weight 285 lbs CPAP  Auto 5-15/ [employer] new 08/08/20  ------------------------------------------------------------   06/26/21- 69 yoF former smoker(36 pkyrs) followed for OSA, complicated by CHF, Allergic Rhinitis, Asthma, OHS, Morbid Obesity,  -Symbicort 160, Flonase, Neb Atrovent 0.02%/ Albuterol, Ventolin hfa,  CPAP auto 5-15/ [employer] Download-compliance 73%, AHI 5.2/ hr Body weight today-299 lbs Covid vax-3 Moderna AT score 19 Treated by PCP for pneumonia in June. Tested neg for Covid then and feels back to baseline after several rounds of abx over 2 months.  Recently aware of pain left of lower T-spine. Doesn't think she hurt herself. No urinary symptoms. Would prefer f/u CT scan of chest this time rather than CXR.although nodules appeared benign on 04/17/20 CT.  Doin well with CPAP. Asthma control good with current meds.  07/03/22- 20 yoF former smoker(36 pkyrs) followed for OSA, complicated by CHF, Allergic Rhinitis, Asthma, OHS, Morbid Obesity, Hyperlipidemia,  -Symbicort 160, Flonase, Neb Atrovent 0.02%/ Albuterol, Ventolin hfa,  CPAP auto 5-15/ [employer]              AirSense 11 AutoSet                 Download-compliance 60%, AHI 3.3/ hr                       Daughter-in-law here Body weight today-290 lbs Covid vax-3 Moderna Sister died cancer. Family asks about updating CT - discussed screening CT program. COVID infection early spring was not treated with antiviral. Download reviewed.  She reports being comfortable with CPAP.  Usage this month impacted by distress related to death in family. Despite long smoking history PFT in 2021 did not show significant COPD  changes. Recent antibiotic for sinusitis led to thrush.  Discussed Symbicort and mouth hygiene.  She only rarely uses rescue inhaler. CTchest 10/23/21- IMPRESSION: 1. Interval resolution of the right middle lobe 9 mm nodule on the last CT. 2. Stable micronodular disease not requiring follow-up. 3. Ground-glass opacity medially in the superior segment of the right middle lobe not seen previously. Probably a small area of pneumonitis. No other focal lung infiltrate. Follow-up as indicated. 4. Aortic and coronary artery atherosclerosis.   ROS-see HPI   + = positive Constitutional:    weight loss, night sweats, fevers, chills, fatigue, lassitude. HEENT:   + headaches, difficulty swallowing,+ tooth/dental problems, sore throat,       sneezing, itching, ear ache, nasal congestion, post nasal drip, snoring CV:    chest pain, orthopnea, PND, +swelling in lower extremities, anasarca,                                   dizziness, palpitations Resp:   +shortness of breath with exertion or at rest.                +productive  non-productive cough, coughing up of blood.              change in color of mucus.  wheezing.   Skin:    rash or lesions. GI:  +heartburn, indigestion, abdominal pain, nausea, vomiting, diarrhea,  change in bowel habits, loss of appetite GU: dysuria, change in color of urine, no urgency or frequency.   flank pain. MS:   +joint pain, stiffness, decreased range of motion, back pain. Neuro-     nothing unusual Psych:  change in mood or affect.  depression or +anxiety.   memory loss.  OBJ- Physical Exam General- Alert, Oriented, Affect-appropriate, Distress- none acute, + morbidly obese Skin- rash-none, lesions- none, excoriation- none Lymphadenopathy- none Head- atraumatic            Eyes- Gross vision intact, PERRLA, conjunctivae and secretions clear            Ears- Hearing, canals-normal            Nose- Clear, no-Septal dev, mucus+thrush, polyps, erosion,  perforation             Throat- Mallampati II-III, mucosa clear , drainage- none, tonsils- atrophic Neck- flexible , trachea midline, no stridor , thyroid nl, carotid no bruit Chest - symmetrical excursion , unlabored           Heart/CV- RRR , no murmur , no gallop  , no rub, nl s1 s2                           - JVD- none , edema- none, stasis changes- none, varices- none           Lung- clear to P&A, wheeze- none, cough- none , dullness-none, rub- none           Chest wall-  Abd-  Br/ Gen/ Rectal- Not done, not indicated Extrem- cyanosis- none, clubbing, none, atrophy- none, strength- nl Neuro- grossly intact to observation

## 2022-07-03 ENCOUNTER — Ambulatory Visit: Payer: Medicare HMO | Admitting: Internal Medicine

## 2022-07-03 ENCOUNTER — Encounter: Payer: Self-pay | Admitting: Internal Medicine

## 2022-07-03 VITALS — BP 126/72 | HR 71 | Ht 65.0 in | Wt 290.0 lb

## 2022-07-03 DIAGNOSIS — B370 Candidal stomatitis: Secondary | ICD-10-CM | POA: Diagnosis not present

## 2022-07-03 DIAGNOSIS — R918 Other nonspecific abnormal finding of lung field: Secondary | ICD-10-CM

## 2022-07-03 DIAGNOSIS — G4733 Obstructive sleep apnea (adult) (pediatric): Secondary | ICD-10-CM

## 2022-07-03 MED ORDER — FLUCONAZOLE 150 MG PO TABS: ORAL_TABLET | ORAL | 1 refills | Status: DC

## 2022-07-03 MED ORDER — ALBUTEROL SULFATE HFA 108 (90 BASE) MCG/ACT IN AERS: 2.0000 | INHALATION_SPRAY | Freq: Four times a day (QID) | RESPIRATORY_TRACT | 12 refills | Status: DC | PRN

## 2022-07-03 MED ORDER — BUDESONIDE-FORMOTEROL FUMARATE 160-4.5 MCG/ACT IN AERO: 1.0000 | INHALATION_SPRAY | Freq: Two times a day (BID) | RESPIRATORY_TRACT | 12 refills | Status: DC

## 2022-07-03 NOTE — Patient Instructions (Addendum)
Scripts sent for refills of Symbicort and albuterol rescue inhaler  Script sent for Diflucan for thrush  Try using your Symbicort maintenance inhaler right before breakfast and before supper. That may cut down on the yeast problem "thrush"  Order- referral to [place] CT screening program  Try using your CPAP consistently every night to get the best benefit.

## 2022-07-16 ENCOUNTER — Encounter: Payer: Self-pay | Admitting: Internal Medicine

## 2022-07-16 DIAGNOSIS — B370 Candidal stomatitis: Secondary | ICD-10-CM

## 2022-07-16 HISTORY — DX: Candidal stomatitis: B37.0

## 2022-07-16 NOTE — Assessment & Plan Note (Signed)
Emphasis on oral hygiene after Symbicort use Plan-Diflucan.  Watch need for spacer

## 2022-07-16 NOTE — Assessment & Plan Note (Signed)
No apparent progression on interval CXR Plan-refer if eligible for low-dose CT chest screening program

## 2022-07-16 NOTE — Assessment & Plan Note (Signed)
Benefits from CPAP when used.  We again discussed compliance and comfort goals, medical purpose and alternatives to CPAP. Plan-continue auto 5-15

## 2022-08-11 ENCOUNTER — Other Ambulatory Visit: Payer: Self-pay

## 2022-08-11 DIAGNOSIS — Z87891 Personal history of nicotine dependence: Secondary | ICD-10-CM

## 2022-08-11 DIAGNOSIS — Z122 Encounter for screening for malignant neoplasm of respiratory organs: Secondary | ICD-10-CM

## 2022-09-02 DIAGNOSIS — B370 Candidal stomatitis: Secondary | ICD-10-CM | POA: Diagnosis not present

## 2022-09-02 DIAGNOSIS — B3781 Candidal esophagitis: Secondary | ICD-10-CM | POA: Diagnosis not present

## 2022-09-02 DIAGNOSIS — J358 Other chronic diseases of tonsils and adenoids: Secondary | ICD-10-CM | POA: Diagnosis not present

## 2022-09-02 DIAGNOSIS — Z23 Encounter for immunization: Secondary | ICD-10-CM | POA: Diagnosis not present

## 2022-09-02 DIAGNOSIS — Z6841 Body Mass Index (BMI) 40.0 and over, adult: Secondary | ICD-10-CM | POA: Diagnosis not present

## 2022-09-06 IMAGING — CT CT HEART MORP W/ CTA COR W/ SCORE W/ CA W/CM &/OR W/O CM
4 of 7 series · 8 of 20 positions shown, 9 images · non-contrast
Comparison: Chest CT October 23, 2021
COMPARISON: Chest CT October 23, 2021

Addendum:
EXAM:
OVER-READ INTERPRETATION  CT CHEST

The following report is an over-read performed by radiologist Dr.
Jhonny German Belaunde [REDACTED] on 02/13/2022. This
over-read does not include interpretation of cardiac or coronary
anatomy or pathology. The coronary calcium score/coronary CTA
interpretation by the cardiologist is attached.
TECHNIQUE: The patient was scanned on a [employer] Force scanner.

[Series 6: ts diast · axial · 0.39mm/px · z∈[-186,-148]mm · 2 of 279 slices shown, 3 images]
[im 93/279  vessel]
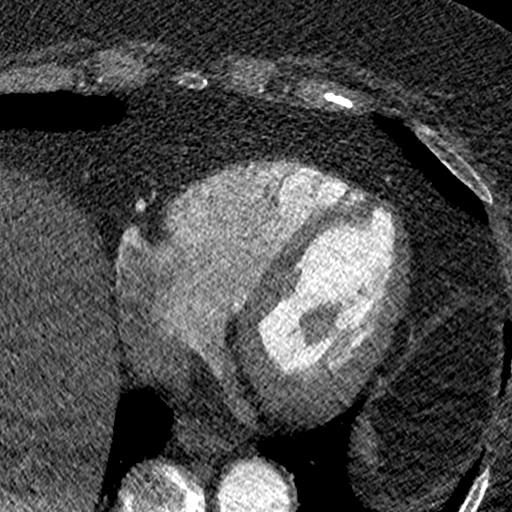
[im 93/279  lung]
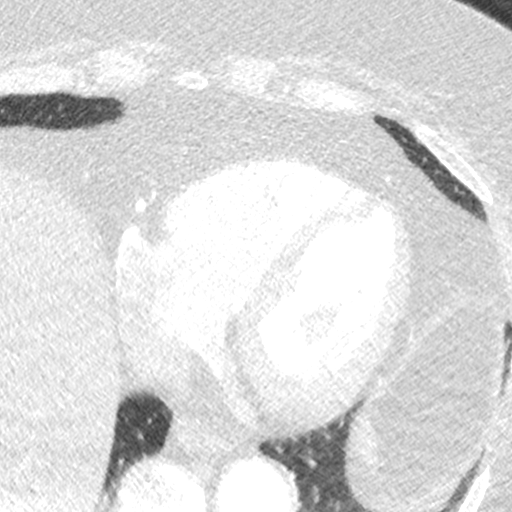
[im 186/279  vessel]
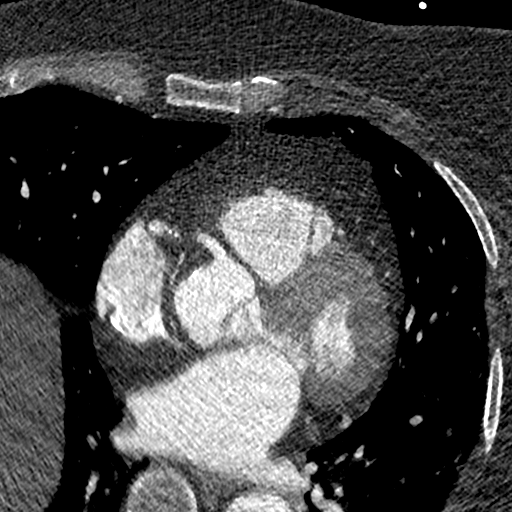

[Series 7: ts syst · axial · 0.39mm/px · z∈[-186,-148]mm · 2 of 279 slices shown]
[im 93/279  vessel]
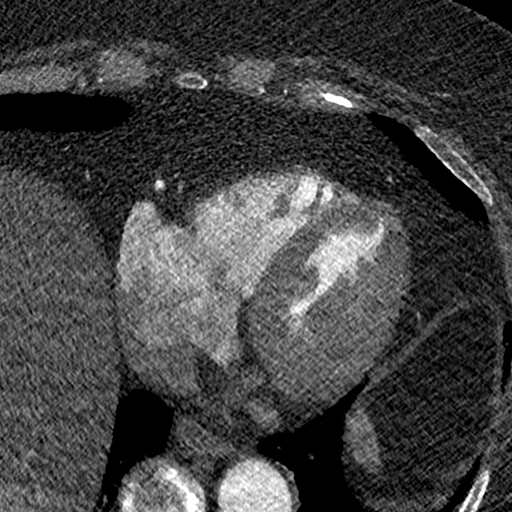
[im 186/279  vessel]
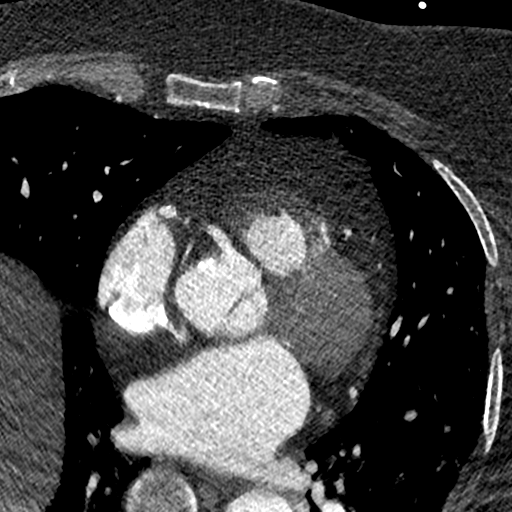

[Series 8: best syst · axial · 0.39mm/px · z∈[-186,-148]mm · 2 of 279 slices shown]
[im 93/279  vessel]
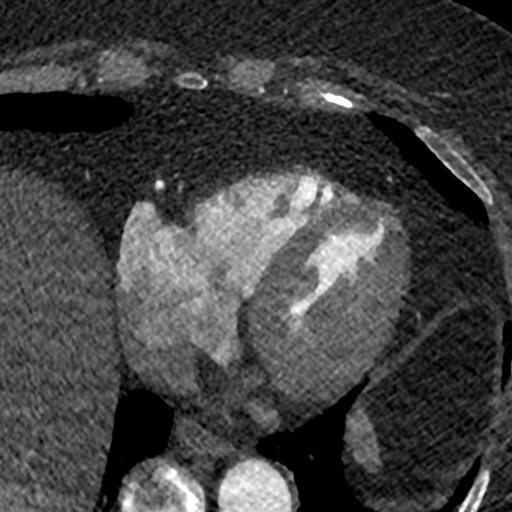
[im 186/279  vessel]
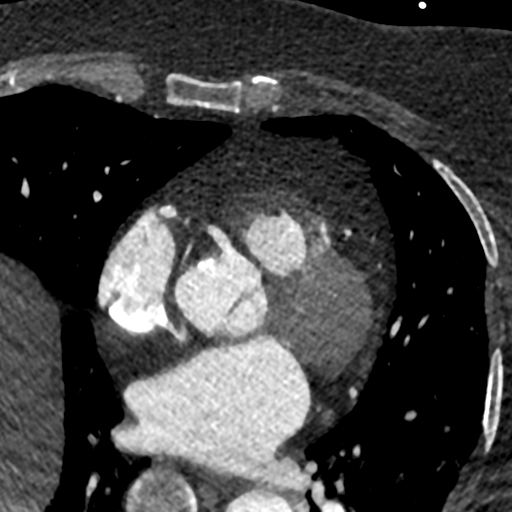

[Series 9: best diast · axial · 0.39mm/px · z∈[-186,-148]mm · 2 of 279 slices shown]
[im 93/279  vessel]
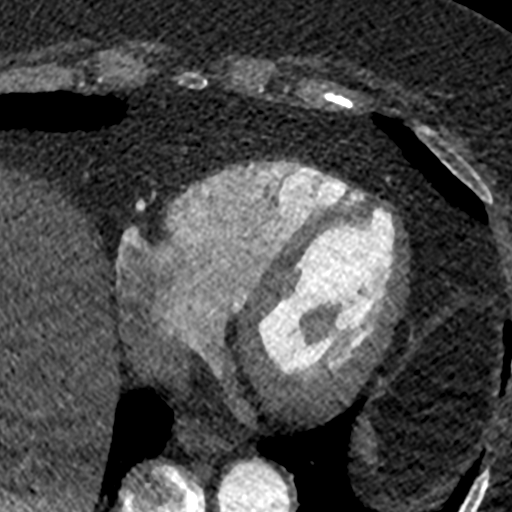
[im 186/279  vessel]
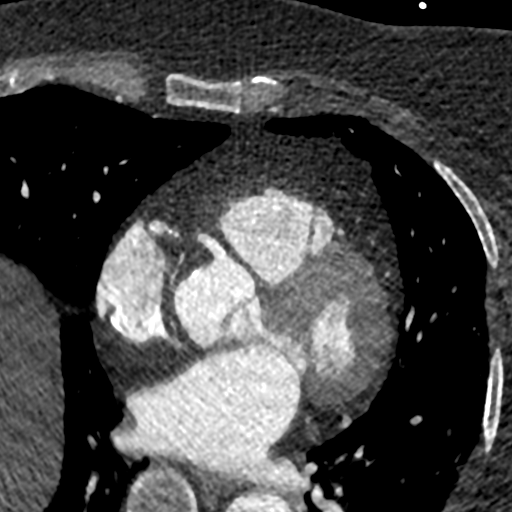

[8 of 20 positions shown; findings below may reference images not displayed]

FINDINGS: Vascular: Aortic atherosclerosis. No acute noncardiac vascular
finding.

Mediastinum/Nodes: No pathologically enlarged mediastinal or hilar
lymph nodes within the acquired field-of-view. Visualized portions
of the esophagus appear normal.

Lungs/Pleura: Hypoventilatory change in the dependent lungs.
Lingular scarring versus atelectasis. No suspicious pulmonary
nodules or masses and no pleural effusion or pneumothorax within the
acquired field-of-view.

Upper Abdomen: No acute abnormality.

Musculoskeletal: No acute osseous abnormality.
IMPRESSION: 1. No significant extracardiac findings are identified.
2. Aortic Atherosclerosis (NSH61-69L.L).

EXAM:
Cardiac/Coronary  CTA
FINDINGS: A 120 kV prospective scan was triggered in the descending thoracic
aorta at 111 HU's. Axial non-contrast 3 mm slices were carried out
through the heart. The data set was analyzed on a dedicated work
station and scored using the Agatson method. Gantry rotation speed
was 250 msecs and collimation was .6 mm. No beta blockade and 0.8 mg
of sl NTG was given. The 3D data set was reconstructed in 5%
intervals of the 67-82 % of the R-R cycle. Diastolic phases were
analyzed on a dedicated work station using MPR, MIP and VRT modes.
The patient received 80 cc of contrast.

Aorta:  Normal size.  No calcifications.  No dissection.

Aortic Valve:  Trileaflet.  Mild calcifications.

Coronary Arteries:  Normal coronary origin.  Right dominance.

RCA is a large dominant artery that gives rise to PDA and PLA. There
are small calcified plaques with irregularities of this artery with
minimal stenosis of 0-25%.

Left main is a large artery that gives rise to LAD and LCX arteries.

LAD is a large vessel. Proximal portion has multiple mixed plaques
with mild stenosis of 25-49%. This portion of the LAD has motion
artefacts and is suboptimally visualized. This artery gives rise to
moderate size D1 which is poorly visualized.

LCX is a non-dominant artery that gives rise to one large OM1
branch. There is no plaque.

Other findings:

Normal pulmonary vein drainage into the left atrium.

Normal left atrial appendage without a thrombus.

Normal size of the pulmonary artery.
IMPRESSION: 1. Coronary calcium score of 252. This was 86 percentile for age and
sex matched control.

2. Normal coronary origin with right dominance.

3. CAD-RADS 2. Mild non-obstructive CAD (25-49%). Consider
non-atherosclerotic causes of chest pain. Consider preventive
therapy and risk factor modification.

4. Proximal portion of LAD poorly visualized, study will be
submitted for FFR to r/o hemodynamically significant stenosis in
this artery.

5.  Can not r/o significant stenosis of moderate size D1

6. TDS

*** End of Addendum ***
EXAM:
OVER-READ INTERPRETATION  CT CHEST

The following report is an over-read performed by radiologist Dr.
Jhonny German Belaunde [REDACTED] on 02/13/2022. This
over-read does not include interpretation of cardiac or coronary
anatomy or pathology. The coronary calcium score/coronary CTA
interpretation by the cardiologist is attached.
FINDINGS: Vascular: Aortic atherosclerosis. No acute noncardiac vascular
finding.

Mediastinum/Nodes: No pathologically enlarged mediastinal or hilar
lymph nodes within the acquired field-of-view. Visualized portions
of the esophagus appear normal.

Lungs/Pleura: Hypoventilatory change in the dependent lungs.
Lingular scarring versus atelectasis. No suspicious pulmonary
nodules or masses and no pleural effusion or pneumothorax within the
acquired field-of-view.

Upper Abdomen: No acute abnormality.

Musculoskeletal: No acute osseous abnormality.
IMPRESSION: 1. No significant extracardiac findings are identified.
2. Aortic Atherosclerosis (NSH61-69L.L).

## 2022-09-26 DIAGNOSIS — Z1231 Encounter for screening mammogram for malignant neoplasm of breast: Secondary | ICD-10-CM | POA: Diagnosis not present

## 2022-10-14 ENCOUNTER — Telehealth: Payer: Self-pay | Admitting: Acute Care

## 2022-10-14 ENCOUNTER — Ambulatory Visit (INDEPENDENT_AMBULATORY_CARE_PROVIDER_SITE_OTHER): Payer: Medicare HMO | Admitting: Acute Care

## 2022-10-14 ENCOUNTER — Encounter: Payer: Self-pay | Admitting: Acute Care

## 2022-10-14 DIAGNOSIS — Z87891 Personal history of nicotine dependence: Secondary | ICD-10-CM

## 2022-10-14 NOTE — Telephone Encounter (Signed)
Sherry Riley this patient is having her CT scan done at the scanner at [facility] in [place].  She has not received a call to get theShe has not received a call to get this scheduled. You have a note at the top of her sheet that says placed order for [facility] to schedule and contact patient. I don't think this has happened yet. Can you follow up? Thanks so [employer]

## 2022-10-14 NOTE — Progress Notes (Signed)
Virtual Visit via Telephone Note  I connected with Sherry Riley on 10/14/22 at 11:00 AM EST by telephone and verified that I am speaking with the correct person using two identifiers.  Location: Patient:  At home Provider: [place], [place], [place], [address]    I discussed the limitations, risks, security and privacy concerns of performing an evaluation and management service by telephone and the availability of in person appointments. I also discussed with the patient that there may be a patient responsible charge related to this service. The patient expressed understanding and agreed to proceed.    Shared Decision Making Visit Lung Cancer Screening Program [phone])   Eligibility: Age 71 y.o. Pack Years Smoking History Calculation 37 pack year smoking history (# packs/per year x # years smoked) Recent History of coughing up blood  no Unexplained weight loss? no ( >Than 15 pounds within the last 6 months ) Prior History Lung / other cancer no (Diagnosis within the last 5 years already requiring surveillance chest CT Scans). Smoking Status Former Smoker Former Smokers: Years since quit: 12 years  Quit Date: 2011  Visit Components: Discussion included one or more decision making aids. yes Discussion included risk/benefits of screening. yes Discussion included potential follow up diagnostic testing for abnormal scans. yes Discussion included meaning and risk of over diagnosis. yes Discussion included meaning and risk of False Positives. yes Discussion included meaning of total radiation exposure. yes  Counseling Included: Importance of adherence to annual lung cancer LDCT screening. yes Impact of comorbidities on ability to participate in the program. yes Ability and willingness to under diagnostic treatment. yes  Smoking Cessation Counseling: Current Smokers:  Discussed importance of smoking cessation. yes Information about tobacco cessation classes and  interventions provided to patient. yes Patient provided with "ticket" for LDCT Scan. yes Symptomatic Patient. no  Counseling NA Diagnosis Code: Tobacco Use Z72.0 Asymptomatic Patient yes  Counseling (Intermediate counseling: > three minutes counseling) B0175 Former Smokers:  Discussed the importance of maintaining cigarette abstinence. yes Diagnosis Code: Personal History of Nicotine Dependence. Z02.585 Information about tobacco cessation classes and interventions provided to patient. Yes Patient provided with "ticket" for LDCT Scan. yes Written Order for Lung Cancer Screening with LDCT placed in Epic. Yes (CT Chest Lung Cancer Screening Low Dose W/O CM) [id number] Z12.2-Screening of respiratory organs Z87.891-Personal history of nicotine dependence  I spent 25 minutes of face to face time/virtual visit time  with  Sherry Riley discussing the risks and benefits of lung cancer screening. We took the time to pause the power point at intervals to allow for questions to be asked and answered to ensure understanding. We discussed that she had taken the single most powerful action possible to decrease her risk of developing lung cancer when she quit smoking. I counseled her to remain smoke free, and to contact me if she ever had the desire to smoke again so that I can provide resources and tools to help support the effort to remain smoke free. We discussed the time and location of the scan, and that either  [name] RN, [name], RN or I  or I will call / send a letter with the results within  24-72 hours of receiving them. She has the office contact information in the event she needs to speak with me,  she verbalized understanding of all of the above and had no further questions upon leaving the office.     I explained to the patient that there has  been a high incidence of coronary artery disease noted on these exams. I explained that this is a non-gated exam therefore degree or severity cannot  be determined. This patient is on statin therapy. I have asked the patient to follow-up with their PCP regarding any incidental finding of coronary artery disease and management with diet or medication as they feel is clinically indicated. The patient verbalized understanding of the above and had no further questions.     Sherry Spatz, NP 10/14/2022

## 2022-10-14 NOTE — Patient Instructions (Signed)
Thank you for participating in the [facility] Cancer Screening Program. It was our pleasure to meet you today. We will call you with the results of your scan within the next few days. Your scan will be assigned a Lung RADS category score by the physicians reading the scans.  This Lung RADS score determines follow up scanning.  See below for description of categories, and follow up screening recommendations. We will be in touch to schedule your follow up screening annually or based on recommendations of our providers. We will fax a copy of your scan results to your Primary Care Physician, or the physician who referred you to the program, to ensure they have the results. Please call the office if you have any questions or concerns regarding your scanning experience or results.  Our office number is [phone]. Please speak with Denise Phelps, RN. , or  Denise Buckner RN, They are  our Lung Cancer Screening RN.'s If They are unavailable when you call, Please leave a message on the voice mail. We will return your call at our earliest convenience.This voice mail is monitored several times a day.  Remember, if your scan is normal, we will scan you annually as long as you continue to meet the criteria for the program. (Age 55-77, Current smoker or smoker who has quit within the last 15 years). If you are a smoker, remember, quitting is the single most powerful action that you can take to decrease your risk of lung cancer and other pulmonary, breathing related problems. We know quitting is hard, and we are here to help.  Please let us know if there is anything we can do to help you meet your goal of quitting. If you are a former smoker, congratulations. We are proud of you! Remain smoke free! Remember you can refer friends or family members through the number above.  We will screen them to make sure they meet criteria for the program. Thank you for helping us take better care of you by  participating in Lung Screening.  You can receive free nicotine replacement therapy ( patches, gum or mints) by calling 1-800-QUIT NOW. Please call so we can get you on the path to becoming  a non-smoker. I know it is hard, but you can do this!  Lung RADS Categories:  Lung RADS 1: no nodules or definitely non-concerning nodules.  Recommendation is for a repeat annual scan in 12 months.  Lung RADS 2:  nodules that are non-concerning in appearance and behavior with a very low likelihood of becoming an active cancer. Recommendation is for a repeat annual scan in 12 months.  Lung RADS 3: nodules that are probably non-concerning , includes nodules with a low likelihood of becoming an active cancer.  Recommendation is for a 6-month repeat screening scan. Often noted after an upper respiratory illness. We will be in touch to make sure you have no questions, and to schedule your 6-month scan.  Lung RADS 4 A: nodules with concerning findings, recommendation is most often for a follow up scan in 3 months or additional testing based on our provider's assessment of the scan. We will be in touch to make sure you have no questions and to schedule the recommended 3 month follow up scan.  Lung RADS 4 B:  indicates findings that are concerning. We will be in touch with you to schedule additional diagnostic testing based on our provider's  assessment of the scan.  Other options for assistance in smoking cessation (   As covered by your insurance benefits)  Hypnosis for smoking cessation  [employer] [phone]  Acupuncture for smoking cessation  [facility] [phone]   

## 2022-10-14 NOTE — Telephone Encounter (Signed)
CT order was faxed to [facility] by [facility] today. They will contact pt to schedule. Will hold message until CT results are received.

## 2022-10-15 DIAGNOSIS — J45909 Unspecified asthma, uncomplicated: Secondary | ICD-10-CM | POA: Diagnosis not present

## 2022-11-11 DIAGNOSIS — Z87891 Personal history of nicotine dependence: Secondary | ICD-10-CM | POA: Diagnosis not present

## 2022-11-11 DIAGNOSIS — K1379 Other lesions of oral mucosa: Secondary | ICD-10-CM | POA: Diagnosis not present

## 2022-11-11 DIAGNOSIS — D490 Neoplasm of unspecified behavior of digestive system: Secondary | ICD-10-CM | POA: Diagnosis not present

## 2022-11-13 NOTE — Telephone Encounter (Signed)
Spoke with Sherry Riley and she still has not gotten a call from [facility] to schedule her lung screening CT. I called and spoke with radiology at [facility] and they will have someone give Sherry Riley a call to schedule.

## 2022-11-15 DIAGNOSIS — J45909 Unspecified asthma, uncomplicated: Secondary | ICD-10-CM | POA: Diagnosis not present

## 2022-11-17 DIAGNOSIS — R7301 Impaired fasting glucose: Secondary | ICD-10-CM | POA: Diagnosis not present

## 2022-11-17 DIAGNOSIS — G4733 Obstructive sleep apnea (adult) (pediatric): Secondary | ICD-10-CM | POA: Diagnosis not present

## 2022-11-17 DIAGNOSIS — E662 Morbid (severe) obesity with alveolar hypoventilation: Secondary | ICD-10-CM | POA: Diagnosis not present

## 2022-11-17 DIAGNOSIS — I700 Atherosclerosis of aorta: Secondary | ICD-10-CM | POA: Diagnosis not present

## 2022-11-17 DIAGNOSIS — J410 Simple chronic bronchitis: Secondary | ICD-10-CM | POA: Diagnosis not present

## 2022-11-17 DIAGNOSIS — I25119 Atherosclerotic heart disease of native coronary artery with unspecified angina pectoris: Secondary | ICD-10-CM | POA: Diagnosis not present

## 2022-11-17 DIAGNOSIS — I519 Heart disease, unspecified: Secondary | ICD-10-CM | POA: Diagnosis not present

## 2022-11-17 DIAGNOSIS — E785 Hyperlipidemia, unspecified: Secondary | ICD-10-CM | POA: Diagnosis not present

## 2022-11-17 DIAGNOSIS — I771 Stricture of artery: Secondary | ICD-10-CM | POA: Diagnosis not present

## 2022-11-17 DIAGNOSIS — I5032 Chronic diastolic (congestive) heart failure: Secondary | ICD-10-CM | POA: Diagnosis not present

## 2022-11-17 DIAGNOSIS — I110 Hypertensive heart disease with heart failure: Secondary | ICD-10-CM | POA: Diagnosis not present

## 2022-11-17 DIAGNOSIS — M6281 Muscle weakness (generalized): Secondary | ICD-10-CM | POA: Diagnosis not present

## 2022-11-19 DIAGNOSIS — D490 Neoplasm of unspecified behavior of digestive system: Secondary | ICD-10-CM | POA: Diagnosis not present

## 2022-12-11 DIAGNOSIS — H524 Presbyopia: Secondary | ICD-10-CM | POA: Diagnosis not present

## 2022-12-11 DIAGNOSIS — H5201 Hypermetropia, right eye: Secondary | ICD-10-CM | POA: Diagnosis not present

## 2022-12-11 DIAGNOSIS — H25813 Combined forms of age-related cataract, bilateral: Secondary | ICD-10-CM | POA: Diagnosis not present

## 2022-12-11 DIAGNOSIS — Z0100 Encounter for examination of eyes and vision without abnormal findings: Secondary | ICD-10-CM | POA: Diagnosis not present

## 2022-12-11 DIAGNOSIS — H52223 Regular astigmatism, bilateral: Secondary | ICD-10-CM | POA: Diagnosis not present

## 2022-12-16 DIAGNOSIS — J45909 Unspecified asthma, uncomplicated: Secondary | ICD-10-CM | POA: Diagnosis not present

## 2022-12-22 ENCOUNTER — Other Ambulatory Visit: Payer: Self-pay | Admitting: Cardiology

## 2022-12-22 NOTE — Telephone Encounter (Signed)
Rx refill sent to pharmacy. 

## 2023-01-08 DIAGNOSIS — N1831 Chronic kidney disease, stage 3a: Secondary | ICD-10-CM | POA: Diagnosis not present

## 2023-01-08 DIAGNOSIS — I25119 Atherosclerotic heart disease of native coronary artery with unspecified angina pectoris: Secondary | ICD-10-CM | POA: Diagnosis not present

## 2023-01-08 DIAGNOSIS — I110 Hypertensive heart disease with heart failure: Secondary | ICD-10-CM | POA: Diagnosis not present

## 2023-01-08 DIAGNOSIS — E785 Hyperlipidemia, unspecified: Secondary | ICD-10-CM | POA: Diagnosis not present

## 2023-01-23 NOTE — Telephone Encounter (Signed)
Left message for pt that she is scheduled for lung screening CT at [facility] 01/29/23 11:30. Left call back number if she has any questions.

## 2023-01-26 DIAGNOSIS — R600 Localized edema: Secondary | ICD-10-CM | POA: Diagnosis not present

## 2023-01-26 DIAGNOSIS — R42 Dizziness and giddiness: Secondary | ICD-10-CM | POA: Diagnosis not present

## 2023-01-28 ENCOUNTER — Telehealth: Payer: Self-pay | Admitting: Cardiology

## 2023-01-28 NOTE — Telephone Encounter (Signed)
STAT if patient feels like he/she is going to faint   Are you dizzy now? No- been having dizzy spells and losing her balance  Do you feel faint or have you passed out? No, but she did fall  Do you have any other symptoms? Legs swelling, some pains in  her chest off and on  Have you checked your HR and BP (record if available)? Blood pressure been good- patient wants an appointment to be seen-- first available appointment for Dr Bettina Gavia is in May

## 2023-01-28 NOTE — Telephone Encounter (Signed)
Spoke with pt who states that she has saw her PCP and they advised her to see Dr. Bettina Gavia. Pt states she had chest tightness 2 weeks ago but did not use nitroglycerin. Pt aware to go to the ED for chest pain not relieved with NTG. Appointment made 3/28. Pt verbalized understanding and had no additional questions.

## 2023-01-29 DIAGNOSIS — Z122 Encounter for screening for malignant neoplasm of respiratory organs: Secondary | ICD-10-CM | POA: Diagnosis not present

## 2023-01-29 DIAGNOSIS — K589 Irritable bowel syndrome without diarrhea: Secondary | ICD-10-CM | POA: Insufficient documentation

## 2023-01-29 DIAGNOSIS — I700 Atherosclerosis of aorta: Secondary | ICD-10-CM | POA: Insufficient documentation

## 2023-01-29 DIAGNOSIS — Z87891 Personal history of nicotine dependence: Secondary | ICD-10-CM | POA: Diagnosis not present

## 2023-01-29 DIAGNOSIS — R7303 Prediabetes: Secondary | ICD-10-CM | POA: Insufficient documentation

## 2023-01-29 DIAGNOSIS — N183 Chronic kidney disease, stage 3 unspecified: Secondary | ICD-10-CM | POA: Insufficient documentation

## 2023-01-29 DIAGNOSIS — F341 Dysthymic disorder: Secondary | ICD-10-CM | POA: Insufficient documentation

## 2023-01-29 DIAGNOSIS — I10 Essential (primary) hypertension: Secondary | ICD-10-CM | POA: Insufficient documentation

## 2023-01-29 DIAGNOSIS — I5189 Other ill-defined heart diseases: Secondary | ICD-10-CM | POA: Insufficient documentation

## 2023-01-29 DIAGNOSIS — K219 Gastro-esophageal reflux disease without esophagitis: Secondary | ICD-10-CM | POA: Insufficient documentation

## 2023-01-29 DIAGNOSIS — E7800 Pure hypercholesterolemia, unspecified: Secondary | ICD-10-CM | POA: Insufficient documentation

## 2023-01-29 DIAGNOSIS — T8859XA Other complications of anesthesia, initial encounter: Secondary | ICD-10-CM | POA: Insufficient documentation

## 2023-01-29 DIAGNOSIS — R61 Generalized hyperhidrosis: Secondary | ICD-10-CM | POA: Insufficient documentation

## 2023-01-29 DIAGNOSIS — I110 Hypertensive heart disease with heart failure: Secondary | ICD-10-CM | POA: Insufficient documentation

## 2023-01-29 DIAGNOSIS — E785 Hyperlipidemia, unspecified: Secondary | ICD-10-CM | POA: Insufficient documentation

## 2023-01-29 DIAGNOSIS — F32A Depression, unspecified: Secondary | ICD-10-CM | POA: Insufficient documentation

## 2023-02-01 NOTE — Progress Notes (Unsigned)
Cardiology Office Note:    Date:  02/05/2023   ID:  Sherry Riley, DOB [birth year], MRN [id number]  PCP:  Street, Sharon Mt, MD  Cardiologist:  Shirlee More, MD    Referring MD: [address], Sharon Mt, *    ASSESSMENT:    1. Bilateral lower extremity edema   2. Coronary artery disease involving native coronary artery of native heart, unspecified whether angina present   3. Agatston coronary artery calcium score between 200 and 399   4. Hyperlipidemia, unspecified hyperlipidemia type    PLAN:    In order of problems listed above:  Initiate low-dose loop diuretic intermittently she may need to stop verapamil or Neurontin Stable CAD mild nonobstructive having no angina continue current medical therapy including aspirin and statin With her morbid obesity I think she would benefit from semaglutide now indicated with CAD and her daughter will discuss with her PCP Further discuss restrictions she is intolerant of statins trial of bempedoic acid   Next appointment: 6 months   Medication Adjustments/Labs and Tests Ordered: Current medicines are reviewed at length with the patient today.  Concerns regarding medicines are outlined above.  No orders of the defined types were placed in this encounter.  No orders of the defined types were placed in this encounter.   Chief Complaint  Patient presents with   Annual Exam    History of Present Illness:    Sherry Riley is a 72 y.o. female with a hx of coronary artery calcification on CT with a coronary calcium score quite elevated to 52/86 percentile and mild nonobstructive CAD on cardiac CTA 02/10/2022 and hyperlipidemia last seen 03/28/2022.  There is a notation by my office staff 02/05/2023 per call to the office with advice to be seen by cardiology because of chest discomfort and a prescription for nitroglycerin by the patient's PCP.  Compliance with diet, lifestyle and medications: Yes  She is seeing me today in the office in  follow-up tells me she has not been having angina She is having Trouble tolerating her current statin and request to go back to her previous prescription She is chronic venous insufficiency takes Neurontin and verapamil as lower extremity edema she does not want to change medications we will give her low-dose loop diuretic to take intermittently. Past Medical History:  Diagnosis Date   Abscess of lower back 0000000   Acute diastolic CHF (congestive heart failure) ([place]) 02/10/2020   Acute respiratory failure with hypoxia (HCC) 02/10/2020   Allergic rhinitis 02/09/2020   Asthma    Atherosclerosis of aorta (HCC)    CKD (chronic kidney disease), symptom management only, stage 3 (moderate) ([place])    Community acquired pneumonia of left lower lobe of lung 02/29/2020   Bronch 02/21/20- Dr Carlis Abbott- for L mass> Benign, lymphocytes, no malignancy   Complication of anesthesia    Hard to wake up.   Degenerative disc disease, lumbar 06/09/2013   Depression    Diastolic dysfunction    Left ventricle; noted on echo in 2021, EF low normal, LV dilated   Dysthymic disorder    Facet hypertrophy of lumbar region 06/09/2013   Fibromyalgia    Generalized hyperhidrosis    GERD (gastroesophageal reflux disease)    High blood pressure    High cholesterol    History of abscess of skin and subcutaneous tissue 08/13/2017   Hypertensive heart disease with chronic diastolic congestive heart failure (HCC)    Irritable bowel syndrome    Multiple lung nodules 07/01/2021  Bronch 02/21/20- Benign, lymphocytes, no malignancy CT chest 04/17/20- c/w benign   Obesity, Class III, BMI 40-49.9 (morbid obesity) ([place]) 02/09/2020   OSA (obstructive sleep apnea) 02/10/2020   HST 05/31/20- AHI 13.5/ hr, desaturation to 68% with mean 89%, body weight 285 lbs   Osteoarthritis 06/02/2017   Pneumonia    Pre-diabetes    Unspecified rotator cuff tear or rupture of left shoulder, not specified as traumatic 02/07/2020    Past  Surgical History:  Procedure Laterality Date   ARTHROSCOPY KNEE W/ DRILLING Right    BIOPSY  02/21/2020   Procedure: BIOPSY;  Surgeon: Julian Hy, DO;  Location: [facility];  Service: Cardiopulmonary;;   CYST EXCISION  2018   Back cyst   ENDOBRONCHIAL ULTRASOUND  02/21/2020   Procedure: ENDOBRONCHIAL ULTRASOUND;  Surgeon: Julian Hy, DO;  Location: [facility];  Service: Cardiopulmonary;;   FINE NEEDLE ASPIRATION  02/21/2020   Procedure: FINE NEEDLE ASPIRATION (FNA) LINEAR;  Surgeon: Julian Hy, DO;  Location: [facility];  Service: Cardiopulmonary;;   HERNIA REPAIR  2015   SHOULDER ARTHROSCOPY WITH BANKART REPAIR Left 02/07/2020   Procedure: SHOULDER ARTHROSCOPY WITH BANKART REPAIR;  Surgeon: Renette Butters, MD;  Location: [facility];  Service: Orthopedics;  Laterality: Left;   VIDEO BRONCHOSCOPY  02/21/2020   Procedure: VIDEO BRONCHOSCOPY WITH FLUORO;  Surgeon: Julian Hy, DO;  Location: [facility];  Service: Cardiopulmonary;;    Current Medications: Current Meds  Medication Sig   albuterol (VENTOLIN HFA) 108 (90 Base) MCG/ACT inhaler Inhale 2 puffs into the lungs every 6 (six) hours as needed for wheezing or shortness of breath.   aspirin EC 81 MG tablet Take 1 tablet (81 mg total) by mouth daily. Swallow whole.   azelastine (ASTELIN) 0.1 % nasal spray Place 2 sprays into both nostrils daily as needed for rhinitis. Use in each nostril as directed   budesonide-formoterol (SYMBICORT) 160-4.5 MCG/ACT inhaler Inhale 1 puff into the lungs 2 (two) times daily. Rinse mouth   Calcium-Magnesium-Vitamin D (CALCIUM MAGNESIUM PO) Take 1,000 mg by mouth 4 (four) times daily.    Cinnamon 500 MG TABS Take 1,000 mg by mouth 4 (four) times daily.   cloNIDine (CATAPRES) 0.1 MG tablet Take 1 tablet (0.1 mg total) by mouth 2 (two) times daily.   famotidine (PEPCID) 40 MG tablet Take 40 mg by mouth daily as needed for heartburn or indigestion.   fluticasone (FLONASE) 50 MCG/ACT nasal spray  Place 1 spray into both nostrils daily.   gabapentin (NEURONTIN) 400 MG capsule Take 400 mg by mouth 3 (three) times daily.   gabapentin (NEURONTIN) 600 MG tablet Take 600 mg by mouth 3 (three) times daily.   loratadine (CLARITIN) 10 MG tablet Take 10 mg by mouth daily.   LORazepam (ATIVAN) 0.5 MG tablet Take 0.5 mg by mouth 2 (two) times daily as needed for anxiety.   meclizine (ANTIVERT) 12.5 MG tablet Take 12.5 mg by mouth every 8 (eight) hours as needed for dizziness.   Misc Natural Products (OSTEO BI-FLEX JOINT SHIELD PO) Take 1 tablet by mouth in the morning and at bedtime.    nitroGLYCERIN (NITROSTAT) 0.4 MG SL tablet Place 1 tablet (0.4 mg total) under the tongue every 5 (five) minutes as needed for chest pain.   polyethylene glycol (MIRALAX / GLYCOLAX) 17 g packet Take 17 g by mouth daily.   venlafaxine XR (EFFEXOR-XR) 75 MG 24 hr capsule Take 75 mg by mouth daily with breakfast.   verapamil (CALAN-SR) 180  MG CR tablet TAKE 1 TABLET BY MOUTH EVERY DAY     Allergies:   Atorvastatin, Lyrica [pregabalin], Actonel [risedronate sodium], Coreg [carvedilol], Hydralazine hcl, Pravastatin, Wellbutrin [bupropion], Ace inhibitors, Diltiazem hcl, Diovan hct [valsartan-hydrochlorothiazide], Doxycycline, and Valsartan   Social History   Socioeconomic History   Marital status: Single    Spouse name: Not on file   Number of children: Not on file   Years of education: Not on file   Highest education level: Not on file  Occupational History   Not on file  Tobacco Use   Smoking status: Former    Packs/day: 1.00    Years: 37.00    Additional pack years: 0.00    Total pack years: 37.00    Types: Cigarettes    Start date: 70    Quit date: 2011    Years since quitting: 13.2    Passive exposure: Past   Smokeless tobacco: Never   Tobacco comments:    one-half to one pack per day  Vaping Use   Vaping Use: Never used  Substance and Sexual Activity   Alcohol use: No   Drug use: No   Sexual  activity: Not on file  Other Topics Concern   Not on file  Social History Narrative   Not on file   Social Determinants of Health   Financial Resource Strain: Not on file  Food Insecurity: Not on file  Transportation Needs: Not on file  Physical Activity: Not on file  Stress: Not on file  Social Connections: Not on file     Family History: The patient's family history includes Asthma in her mother; Diabetes in her brother and mother; Heart attack in her mother; Hypertension in her mother; Lung cancer in her father. ROS:   Please see the history of present illness.    All other systems reviewed and are negative.  EKGs/Labs/Other Studies Reviewed:    The following studies were reviewed today:  Cardiac Studies & Procedures       ECHOCARDIOGRAM  ECHOCARDIOGRAM COMPLETE 02/10/2020  Narrative ECHOCARDIOGRAM REPORT    Patient Name:   [name] Date of Exam: 02/10/2020 Medical Rec #:  [id number]       Height:       65.0 in Accession #:    [id number]      Weight:       286.1 lb Date of Birth:  13-Dec-1950        BSA:          2.303 m Patient Age:    16 years        BP:           145/84 mmHg Patient Gender: F               HR:           70 bpm. Exam Location:  Inpatient  Procedure: 2D Echo  Indications:    Acute respiratory failure with hypoxia ([place]) [id number]  History:        Patient has no prior history of Echocardiogram examinations. Risk Factors:Hypertension, Dyslipidemia, Prediabetes and Former Smoker. Left rotator cuff tear.  Sonographer:    Darlina Sicilian RDCS Referring Phys: [id number] [place]   Sonographer Comments: Technically challenging study due to left rotator cuff tear. IMPRESSIONS   1. Technically difficult; normal LV systolic function; mild LVE; grade 1 diastolic dysfunction; trace AI. 2. Left ventricular ejection fraction, by estimation, is 50 to 55%. The left ventricle has low normal function.  The left ventricle has no regional wall motion  abnormalities. The left ventricular internal cavity size was mildly dilated. Left ventricular diastolic parameters are consistent with Grade I diastolic dysfunction (impaired relaxation). 3. Right ventricular systolic function is normal. The right ventricular size is normal. Tricuspid regurgitation signal is inadequate for assessing PA pressure. 4. The mitral valve is normal in structure. Trivial mitral valve regurgitation. No evidence of mitral stenosis. 5. The aortic valve is normal in structure. Aortic valve regurgitation is trivial. No aortic stenosis is present. 6. The inferior vena cava is dilated in size with <50% respiratory variability, suggesting right atrial pressure of 15 mmHg.  FINDINGS Left Ventricle: Left ventricular ejection fraction, by estimation, is 50 to 55%. The left ventricle has low normal function. The left ventricle has no regional wall motion abnormalities. The left ventricular internal cavity size was mildly dilated. There is no left ventricular hypertrophy. Left ventricular diastolic parameters are consistent with Grade I diastolic dysfunction (impaired relaxation).  Right Ventricle: The right ventricular size is normal.Right ventricular systolic function is normal. Tricuspid regurgitation signal is inadequate for assessing PA pressure. The tricuspid regurgitant velocity is 1.60 m/s, and with an assumed right atrial pressure of 15 mmHg, the estimated right ventricular systolic pressure is [id number].  Left Atrium: Left atrial size was normal in size.  Right Atrium: Right atrial size was normal in size.  Pericardium: There is no evidence of pericardial effusion.  Mitral Valve: The mitral valve is normal in structure. Normal mobility of the mitral valve leaflets. Mild mitral annular calcification. Trivial mitral valve regurgitation. No evidence of mitral valve stenosis.  Tricuspid Valve: The tricuspid valve is normal in structure. Tricuspid valve regurgitation is  trivial. No evidence of tricuspid stenosis.  Aortic Valve: The aortic valve is normal in structure. Aortic valve regurgitation is trivial. No aortic stenosis is present.  Pulmonic Valve: The pulmonic valve was not well visualized. Pulmonic valve regurgitation is not visualized. No evidence of pulmonic stenosis.  Aorta: The aortic root is normal in size and structure.  Venous: The inferior vena cava is dilated in size with less than 50% respiratory variability, suggesting right atrial pressure of 15 mmHg.  Additional Comments: Technically difficult; normal LV systolic function; mild LVE; grade 1 diastolic dysfunction; trace AI.   LEFT VENTRICLE PLAX 2D LVIDd:         5.30 cm  Diastology LVIDs:         3.60 cm  LV e' lateral:   0.06 cm/s LV PW:         0.70 cm  LV E/e' lateral: 12.0 LV IVS:        0.90 cm  LV e' medial:    0.06 cm/s LVOT diam:     2.00 cm  LV E/e' medial:  12.5 LV SV:         68 LV SV Index:   30 LVOT Area:     3.14 cm   RIGHT VENTRICLE RV S prime:     9.03 cm/s TAPSE (M-mode): 1.7 cm  LEFT ATRIUM             Index LA diam:        3.60 cm 1.56 cm/m LA Vol (A2C):   38.1 ml 16.54 ml/m LA Vol (A4C):   33.8 ml 14.68 ml/m LA Biplane Vol: 35.8 ml 15.54 ml/m AORTIC VALVE LVOT Vmax:   90.80 cm/s LVOT Vmean:  60.400 cm/s LVOT VTI:    0.218 m  AORTA Ao Root diam: 2.70  cm  MITRAL VALVE               TRICUSPID VALVE MV Area (PHT): 4.49 cm    TR Peak grad:   10.2 mmHg MV Decel Time: 169 msec    TR Vmax:        160.00 cm/s MV E velocity: 0.72 cm/s MV A velocity: 93.00 cm/s  SHUNTS MV E/A ratio:  0.01        Systemic VTI:  0.22 m Systemic Diam: 2.00 cm  Kirk Ruths MD Electronically signed by Kirk Ruths MD Signature Date/Time: 02/10/2020/1:54:11 PM    Final     CT SCANS  CT CORONARY MORPH W/CTA COR W/SCORE 02/13/2022  Addendum 02/13/2022  6:01 PM ADDENDUM REPORT: 02/13/2022 17:59  EXAM: Cardiac/Coronary  CTA  TECHNIQUE: The patient was  scanned on a [employer].  FINDINGS: A 120 kV prospective scan was triggered in the descending thoracic aorta at 111 HU's. Axial non-contrast 3 mm slices were carried out through the heart. The data set was analyzed on a dedicated work station and scored using the [place]. Gantry rotation speed was 250 msecs and collimation was .6 mm. No beta blockade and 0.8 mg of sl NTG was given. The 3D data set was reconstructed in 5% intervals of the 67-82 % of the R-R cycle. Diastolic phases were analyzed on a dedicated work station using MPR, MIP and VRT modes. The patient received 80 cc of contrast.  Aorta:  Normal size.  No calcifications.  No dissection.  Aortic Valve:  Trileaflet.  Mild calcifications.  Coronary Arteries:  Normal coronary origin.  Right dominance.  RCA is a large dominant artery that gives rise to PDA and PLA. There are small calcified plaques with irregularities of this artery with minimal stenosis of 0-25%.  Left main is a large artery that gives rise to LAD and LCX arteries.  LAD is a large vessel. Proximal portion has multiple mixed plaques with mild stenosis of 25-49%. This portion of the LAD has motion artefacts and is suboptimally visualized. This artery gives rise to moderate size D1 which is poorly visualized.  LCX is a non-dominant artery that gives rise to one large OM1 branch. There is no plaque.  Other findings:  Normal pulmonary vein drainage into the left atrium.  Normal left atrial appendage without a thrombus.  Normal size of the pulmonary artery.  IMPRESSION: 1. Coronary calcium score of 252. This was 17 percentile for age and sex matched control.  2. Normal coronary origin with right dominance.  3. CAD-RADS 2. Mild non-obstructive CAD (25-49%). Consider non-atherosclerotic causes of chest pain. Consider preventive therapy and risk factor modification.  4. Proximal portion of LAD poorly visualized, study will  be submitted for FFR to r/o hemodynamically significant stenosis in this artery.  5.  Can not r/o significant stenosis of moderate size D1  6. TDS   Electronically Signed By: Jenne Campus M.D. On: 02/13/2022 17:59  Narrative EXAM: OVER-READ INTERPRETATION  CT CHEST  The following report is an over-read performed by radiologist Dr. Dahlia Bailiff of [facility], [place] on 02/13/2022. This over-read does not include interpretation of cardiac or coronary anatomy or pathology. The coronary calcium score/coronary CTA interpretation by the cardiologist is attached.  COMPARISON:  Chest CT October 23, 2021  FINDINGS: Vascular: Aortic atherosclerosis. No acute noncardiac vascular finding.  Mediastinum/Nodes: No pathologically enlarged mediastinal or hilar lymph nodes within the acquired field-of-view. Visualized portions of the esophagus appear normal.  Lungs/Pleura: Hypoventilatory  change in the dependent lungs. Lingular scarring versus atelectasis. No suspicious pulmonary nodules or masses and no pleural effusion or pneumothorax within the acquired field-of-view.  Upper Abdomen: No acute abnormality.  Musculoskeletal: No acute osseous abnormality.  IMPRESSION: 1. No significant extracardiac findings are identified. 2. Aortic Atherosclerosis (ICD10-I70.0).  Electronically Signed: By: Dahlia Bailiff M.D. On: 02/13/2022 11:24          EKG:  EKG ordered today and personally reviewed.  The ekg ordered today demonstrates sinus rhythm poor wave progression  Recent Labs: 02/12/2022: BUN 25; Creatinine, Ser 1.01; Potassium 4.7; Sodium 140  Recent Lipid Panel No results found for: "CHOL", "TRIG", "HDL", "CHOLHDL", "VLDL", "LDLCALC", "LDLDIRECT"  Physical Exam:    VS:  BP 123/80 (BP Location: Left Arm, Patient Position: Sitting, Cuff Size: Normal)   Pulse 74   Ht 5\' 5"  (1.651 m)   Wt (!) 301 lb (136.5 kg)   SpO2 93%   BMI 50.09 kg/m     Wt Readings from Last 3  Encounters:  02/05/23 (!) 301 lb (136.5 kg)  07/03/22 290 lb (131.5 kg)  03/28/22 291 lb 6.4 oz (132.2 kg)     GEN:  Well nourished, well developed in no acute distress morbid obesity BMI exceeds HEENT: Normal NECK: No JVD; No carotid bruits LYMPHATICS: No lymphadenopathy CARDIAC: RRR, no murmurs, rubs, gallops RESPIRATORY:  Clear to auscultation without rales, wheezing or rhonchi  ABDOMEN: Soft, non-tender, non-distended MUSCULOSKELETAL:  No edema; No deformity  SKIN: Warm and dry NEUROLOGIC:  Alert and oriented x 3 PSYCHIATRIC:  Normal affect    Signed, Shirlee More, MD  02/05/2023 1:51 PM     [facility]

## 2023-02-04 ENCOUNTER — Encounter: Payer: Self-pay | Admitting: *Deleted

## 2023-02-04 ENCOUNTER — Other Ambulatory Visit: Payer: Self-pay | Admitting: *Deleted

## 2023-02-04 DIAGNOSIS — Z122 Encounter for screening for malignant neoplasm of respiratory organs: Secondary | ICD-10-CM

## 2023-02-04 DIAGNOSIS — Z87891 Personal history of nicotine dependence: Secondary | ICD-10-CM

## 2023-02-05 ENCOUNTER — Other Ambulatory Visit: Payer: Self-pay | Admitting: Cardiology

## 2023-02-05 ENCOUNTER — Ambulatory Visit: Payer: Medicare HMO | Attending: Cardiology | Admitting: Cardiology

## 2023-02-05 ENCOUNTER — Encounter: Payer: Self-pay | Admitting: Cardiology

## 2023-02-05 VITALS — BP 123/80 | HR 74 | Ht 65.0 in | Wt 301.0 lb

## 2023-02-05 DIAGNOSIS — I2510 Atherosclerotic heart disease of native coronary artery without angina pectoris: Secondary | ICD-10-CM

## 2023-02-05 DIAGNOSIS — R931 Abnormal findings on diagnostic imaging of heart and coronary circulation: Secondary | ICD-10-CM | POA: Diagnosis not present

## 2023-02-05 DIAGNOSIS — E785 Hyperlipidemia, unspecified: Secondary | ICD-10-CM | POA: Diagnosis not present

## 2023-02-05 DIAGNOSIS — R600 Localized edema: Secondary | ICD-10-CM

## 2023-02-05 MED ORDER — FUROSEMIDE 20 MG PO TABS: 20.0000 mg | ORAL_TABLET | Freq: Every day | ORAL | 1 refills | Status: DC | PRN

## 2023-02-05 MED ORDER — BEMPEDOIC ACID 180 MG PO TABS
180.0000 mg | ORAL_TABLET | Freq: Every day | ORAL | 3 refills | Status: DC
Start: 2023-02-05 — End: 2023-07-09

## 2023-02-05 NOTE — Patient Instructions (Addendum)
Medication Instructions:  Your physician has recommended you make the following change in your medication:   START: Furosemide 20 mg daily as needed for fluid and edema START: Bempedoic Acid 180 mg daily STOP: Zantac STOP: Rosuvastatin  *If you need a refill on your cardiac medications before your next appointment, please call your pharmacy*   Lab Work: None If you have labs (blood work) drawn today and your tests are completely normal, you will receive your results only by: [place] (if you have MyChart) OR A paper copy in the mail If you have any lab test that is abnormal or we need to change your treatment, we will call you to review the results.   Testing/Procedures: None   Follow-Up: At [facility], you and your health needs are our priority.  As part of our continuing mission to provide you with exceptional heart care, we have created designated Provider Care Teams.  These Care Teams include your primary Cardiologist (physician) and Advanced Practice Providers (APPs -  Physician Assistants and Nurse Practitioners) who all work together to provide you with the care you need, when you need it.  We recommend signing up for the patient portal called "MyChart".  Sign up information is provided on this After Visit Summary.  MyChart is used to connect with patients for Virtual Visits (Telemedicine).  Patients are able to view lab/test results, encounter notes, upcoming appointments, etc.  Non-urgent messages can be sent to your provider as well.   To learn more about what you can do with MyChart, go to [email or web address].    Your next appointment:   1 year(s)  Provider:   Shirlee More, MD    Other Instructions None

## 2023-02-18 DIAGNOSIS — I25119 Atherosclerotic heart disease of native coronary artery with unspecified angina pectoris: Secondary | ICD-10-CM | POA: Diagnosis not present

## 2023-02-18 DIAGNOSIS — G720 Drug-induced myopathy: Secondary | ICD-10-CM | POA: Diagnosis not present

## 2023-02-18 DIAGNOSIS — T466X5A Adverse effect of antihyperlipidemic and antiarteriosclerotic drugs, initial encounter: Secondary | ICD-10-CM | POA: Diagnosis not present

## 2023-02-18 DIAGNOSIS — Z6841 Body Mass Index (BMI) 40.0 and over, adult: Secondary | ICD-10-CM | POA: Diagnosis not present

## 2023-02-18 DIAGNOSIS — E785 Hyperlipidemia, unspecified: Secondary | ICD-10-CM | POA: Diagnosis not present

## 2023-02-26 ENCOUNTER — Other Ambulatory Visit: Payer: Self-pay

## 2023-02-26 ENCOUNTER — Telehealth: Payer: Self-pay | Admitting: Cardiology

## 2023-02-26 DIAGNOSIS — E785 Hyperlipidemia, unspecified: Secondary | ICD-10-CM

## 2023-02-26 NOTE — Telephone Encounter (Signed)
Pt c/o medication issue:  1. Name of Medication:    Bempedoic Acid 180 MG TABS   2. How are you currently taking this medication (dosage and times per day)?  Had not started taking medication  3. Are you having a reaction (difficulty breathing--STAT)?   4. What is your medication issue?    Patient stated medication is too expensive and wants alternate medication.

## 2023-02-26 NOTE — Telephone Encounter (Signed)
Called patient and she stated that she could not afford to take the Bempedoic Acid, because it was too expensive. Patient requested to be placed on another medication that was cheaper but did the same thing as Bempedoic Acid. Spoke with Wallis Bamberg, the nurse practitioner and she recommended referring the patient to the lipid clinic. I informed the patient of this recommendation and she was agreeable with this plan and had no further questions at this time.

## 2023-03-02 DIAGNOSIS — Z01411 Encounter for gynecological examination (general) (routine) with abnormal findings: Secondary | ICD-10-CM | POA: Diagnosis not present

## 2023-03-02 DIAGNOSIS — R399 Unspecified symptoms and signs involving the genitourinary system: Secondary | ICD-10-CM | POA: Diagnosis not present

## 2023-03-02 DIAGNOSIS — Z124 Encounter for screening for malignant neoplasm of cervix: Secondary | ICD-10-CM | POA: Diagnosis not present

## 2023-03-02 DIAGNOSIS — B9629 Other Escherichia coli [E. coli] as the cause of diseases classified elsewhere: Secondary | ICD-10-CM | POA: Diagnosis not present

## 2023-03-02 DIAGNOSIS — N390 Urinary tract infection, site not specified: Secondary | ICD-10-CM | POA: Diagnosis not present

## 2023-03-27 DIAGNOSIS — R42 Dizziness and giddiness: Secondary | ICD-10-CM | POA: Diagnosis not present

## 2023-03-27 DIAGNOSIS — M6281 Muscle weakness (generalized): Secondary | ICD-10-CM | POA: Diagnosis not present

## 2023-03-27 DIAGNOSIS — R296 Repeated falls: Secondary | ICD-10-CM | POA: Diagnosis not present

## 2023-03-27 DIAGNOSIS — R5383 Other fatigue: Secondary | ICD-10-CM | POA: Diagnosis not present

## 2023-03-27 DIAGNOSIS — R2681 Unsteadiness on feet: Secondary | ICD-10-CM | POA: Diagnosis not present

## 2023-04-01 DIAGNOSIS — M6281 Muscle weakness (generalized): Secondary | ICD-10-CM | POA: Diagnosis not present

## 2023-04-01 DIAGNOSIS — R296 Repeated falls: Secondary | ICD-10-CM | POA: Diagnosis not present

## 2023-04-01 DIAGNOSIS — R5383 Other fatigue: Secondary | ICD-10-CM | POA: Diagnosis not present

## 2023-04-01 DIAGNOSIS — R42 Dizziness and giddiness: Secondary | ICD-10-CM | POA: Diagnosis not present

## 2023-04-01 DIAGNOSIS — R2681 Unsteadiness on feet: Secondary | ICD-10-CM | POA: Diagnosis not present

## 2023-04-03 DIAGNOSIS — R296 Repeated falls: Secondary | ICD-10-CM | POA: Diagnosis not present

## 2023-04-03 DIAGNOSIS — M6281 Muscle weakness (generalized): Secondary | ICD-10-CM | POA: Diagnosis not present

## 2023-04-03 DIAGNOSIS — R5383 Other fatigue: Secondary | ICD-10-CM | POA: Diagnosis not present

## 2023-04-03 DIAGNOSIS — R2681 Unsteadiness on feet: Secondary | ICD-10-CM | POA: Diagnosis not present

## 2023-04-03 DIAGNOSIS — R42 Dizziness and giddiness: Secondary | ICD-10-CM | POA: Diagnosis not present

## 2023-04-10 DIAGNOSIS — N1831 Chronic kidney disease, stage 3a: Secondary | ICD-10-CM | POA: Diagnosis not present

## 2023-04-10 DIAGNOSIS — I25119 Atherosclerotic heart disease of native coronary artery with unspecified angina pectoris: Secondary | ICD-10-CM | POA: Diagnosis not present

## 2023-04-10 DIAGNOSIS — I110 Hypertensive heart disease with heart failure: Secondary | ICD-10-CM | POA: Diagnosis not present

## 2023-04-10 DIAGNOSIS — E785 Hyperlipidemia, unspecified: Secondary | ICD-10-CM | POA: Diagnosis not present

## 2023-04-16 DIAGNOSIS — R5383 Other fatigue: Secondary | ICD-10-CM | POA: Diagnosis not present

## 2023-04-16 DIAGNOSIS — M6281 Muscle weakness (generalized): Secondary | ICD-10-CM | POA: Diagnosis not present

## 2023-04-16 DIAGNOSIS — R296 Repeated falls: Secondary | ICD-10-CM | POA: Diagnosis not present

## 2023-04-16 DIAGNOSIS — R42 Dizziness and giddiness: Secondary | ICD-10-CM | POA: Diagnosis not present

## 2023-04-16 DIAGNOSIS — R2681 Unsteadiness on feet: Secondary | ICD-10-CM | POA: Diagnosis not present

## 2023-04-20 DIAGNOSIS — R296 Repeated falls: Secondary | ICD-10-CM | POA: Diagnosis not present

## 2023-04-20 DIAGNOSIS — R2681 Unsteadiness on feet: Secondary | ICD-10-CM | POA: Diagnosis not present

## 2023-04-20 DIAGNOSIS — R5383 Other fatigue: Secondary | ICD-10-CM | POA: Diagnosis not present

## 2023-04-20 DIAGNOSIS — R42 Dizziness and giddiness: Secondary | ICD-10-CM | POA: Diagnosis not present

## 2023-04-20 DIAGNOSIS — M6281 Muscle weakness (generalized): Secondary | ICD-10-CM | POA: Diagnosis not present

## 2023-04-22 DIAGNOSIS — R42 Dizziness and giddiness: Secondary | ICD-10-CM | POA: Diagnosis not present

## 2023-04-22 DIAGNOSIS — M6281 Muscle weakness (generalized): Secondary | ICD-10-CM | POA: Diagnosis not present

## 2023-04-22 DIAGNOSIS — R5383 Other fatigue: Secondary | ICD-10-CM | POA: Diagnosis not present

## 2023-04-22 DIAGNOSIS — R296 Repeated falls: Secondary | ICD-10-CM | POA: Diagnosis not present

## 2023-04-22 DIAGNOSIS — R2681 Unsteadiness on feet: Secondary | ICD-10-CM | POA: Diagnosis not present

## 2023-04-30 DIAGNOSIS — R296 Repeated falls: Secondary | ICD-10-CM | POA: Diagnosis not present

## 2023-04-30 DIAGNOSIS — M6281 Muscle weakness (generalized): Secondary | ICD-10-CM | POA: Diagnosis not present

## 2023-04-30 DIAGNOSIS — R5383 Other fatigue: Secondary | ICD-10-CM | POA: Diagnosis not present

## 2023-04-30 DIAGNOSIS — R42 Dizziness and giddiness: Secondary | ICD-10-CM | POA: Diagnosis not present

## 2023-04-30 DIAGNOSIS — R2681 Unsteadiness on feet: Secondary | ICD-10-CM | POA: Diagnosis not present

## 2023-05-04 ENCOUNTER — Other Ambulatory Visit: Payer: Self-pay | Admitting: Cardiology

## 2023-05-06 DIAGNOSIS — M6281 Muscle weakness (generalized): Secondary | ICD-10-CM | POA: Diagnosis not present

## 2023-05-06 DIAGNOSIS — R2681 Unsteadiness on feet: Secondary | ICD-10-CM | POA: Diagnosis not present

## 2023-05-06 DIAGNOSIS — R296 Repeated falls: Secondary | ICD-10-CM | POA: Diagnosis not present

## 2023-05-06 DIAGNOSIS — R5383 Other fatigue: Secondary | ICD-10-CM | POA: Diagnosis not present

## 2023-05-06 DIAGNOSIS — R42 Dizziness and giddiness: Secondary | ICD-10-CM | POA: Diagnosis not present

## 2023-05-13 DIAGNOSIS — M6281 Muscle weakness (generalized): Secondary | ICD-10-CM | POA: Diagnosis not present

## 2023-05-13 DIAGNOSIS — R42 Dizziness and giddiness: Secondary | ICD-10-CM | POA: Diagnosis not present

## 2023-05-13 DIAGNOSIS — R296 Repeated falls: Secondary | ICD-10-CM | POA: Diagnosis not present

## 2023-05-13 DIAGNOSIS — R5383 Other fatigue: Secondary | ICD-10-CM | POA: Diagnosis not present

## 2023-05-13 DIAGNOSIS — R2681 Unsteadiness on feet: Secondary | ICD-10-CM | POA: Diagnosis not present

## 2023-05-18 ENCOUNTER — Ambulatory Visit: Payer: Medicare HMO | Admitting: Internal Medicine

## 2023-05-18 ENCOUNTER — Encounter: Payer: Self-pay | Admitting: Internal Medicine

## 2023-05-18 ENCOUNTER — Ambulatory Visit (INDEPENDENT_AMBULATORY_CARE_PROVIDER_SITE_OTHER): Payer: Medicare HMO

## 2023-05-18 VITALS — BP 126/70 | HR 74 | Temp 97.8°F | Ht 65.0 in | Wt 303.8 lb

## 2023-05-18 DIAGNOSIS — R0609 Other forms of dyspnea: Secondary | ICD-10-CM

## 2023-05-18 DIAGNOSIS — R0902 Hypoxemia: Secondary | ICD-10-CM

## 2023-05-18 DIAGNOSIS — I5032 Chronic diastolic (congestive) heart failure: Secondary | ICD-10-CM | POA: Diagnosis not present

## 2023-05-18 DIAGNOSIS — I110 Hypertensive heart disease with heart failure: Secondary | ICD-10-CM

## 2023-05-18 DIAGNOSIS — G4733 Obstructive sleep apnea (adult) (pediatric): Secondary | ICD-10-CM

## 2023-05-18 DIAGNOSIS — R9389 Abnormal findings on diagnostic imaging of other specified body structures: Secondary | ICD-10-CM | POA: Diagnosis not present

## 2023-05-18 DIAGNOSIS — R0602 Shortness of breath: Secondary | ICD-10-CM | POA: Diagnosis not present

## 2023-05-18 DIAGNOSIS — J9611 Chronic respiratory failure with hypoxia: Secondary | ICD-10-CM

## 2023-05-18 DIAGNOSIS — J9601 Acute respiratory failure with hypoxia: Secondary | ICD-10-CM

## 2023-05-18 LAB — D-DIMER, QUANTITATIVE: D-Dimer, Quant: 0.48 mcg/mL FEU (ref <0.50)

## 2023-05-18 NOTE — Progress Notes (Signed)
HPI  F former smoker followed for OSA, complicated by CHF, Allergic Rhinitis, Asthma, OHS, Morbid Obesity, Bronch 02/21/20- Dr Chestine Spore- for L mass> Benign, lymphocytes, no malignancy PFT 05/31/20- slight restriction of exhaled volume, Increased DLCO/VA, no resp to BD HST 05/31/20- AHI 13.5/ hr, desaturation to 68% with mean 89%, body weight 285 lbs CPAP  Auto 5-15/ [employer] new 08/08/20  Walk Test on room air 05/18/23- desat to 2L. Will start home O2 2l portable. ------------------------------------------------------------   07/03/22- 69 yoF former smoker(36 pkyrs) followed for OSA, complicated by CHF, Allergic Rhinitis, Asthma, OHS, Morbid Obesity, Hyperlipidemia,  -Symbicort 160, Flonase, Neb Atrovent 0.02%/ Albuterol, Ventolin hfa,  CPAP auto 5-15/ [employer]              AirSense 11 AutoSet                 Download-compliance 60%, AHI 3.3/ hr                       Daughter-in-law here Body weight today-290 lbs Covid vax-3 Moderna Sister died cancer. Family asks about updating CT - discussed screening CT program. COVID infection early spring was not treated with antiviral. Download reviewed.  She reports being comfortable with CPAP.  Usage this month impacted by distress related to death in family. Despite long smoking history PFT in 2021 did not show significant COPD changes. Recent antibiotic for sinusitis led to thrush.  Discussed Symbicort and mouth hygiene.  She only rarely uses rescue inhaler. CTchest 10/23/21- IMPRESSION: 1. Interval resolution of the right middle lobe 9 mm nodule on the last CT. 2. Stable micronodular disease not requiring follow-up. 3. Ground-glass opacity medially in the superior segment of the right middle lobe not seen previously. Probably a small area of pneumonitis. No other focal lung infiltrate. Follow-up as indicated. 4. Aortic and coronary artery atherosclerosis.  05/18/23- 71 yoF former smoker(36 pkyrs) followed for OSA, complicated by CHF, ASCVD, , Allergic  Rhinitis, Asthma, OHS, Morbid Obesity, Hyperlipidemia, Hx AdenoCA L Lung, Morbid Obesity,  -Symbicort 160, Flonase, Neb Atrovent 0.02%/ Albuterol, Ventolin hfa,  CPAP auto 5-15/ [employer]              AirSense 11 AutoSet                 Download-compliance      87%, AHI 5.2/ hr                Body weight today-303 lbs CT chest low dose screening program- Cardiology following for ASCVD/CAD with peripheral edema. ------Sob during exertion. Ongoing for a week  PT stopped due to desaturation. Download reviewed- CPAP doing well. Increased DOE x 2 weeks- more labored. More cough-clear phlegm. Got med for edema. Using inhalers but not nebulizer. Walk Test on room air 05/18/23- desat to 2L. Will start home O2 2l portable.  ROS-see HPI   + = positive Constitutional:    weight loss, night sweats, fevers, chills, fatigue, lassitude. HEENT:   + headaches, difficulty swallowing,+ tooth/dental problems, sore throat,       sneezing, itching, ear ache, nasal congestion, post nasal drip, snoring CV:    chest pain, orthopnea, PND, +swelling in lower extremities, anasarca,                                   dizziness, palpitations Resp:   +shortness of breath with exertion or at rest.                +  productive  non-productive cough, coughing up of blood.              change in color of mucus.  wheezing.   Skin:    rash or lesions. GI:  +heartburn, indigestion, abdominal pain, nausea, vomiting, diarrhea,                 change in bowel habits, loss of appetite GU: dysuria, change in color of urine, no urgency or frequency.   flank pain. MS:   +joint pain, stiffness, decreased range of motion, back pain. Neuro-     nothing unusual Psych:  change in mood or affect.  depression or +anxiety.   memory loss.  OBJ- Physical Exam General- Alert, Oriented, Affect-appropriate, Distress- none acute, + morbidly obese Skin- rash-none, lesions- none, excoriation- none Lymphadenopathy- none Head- atraumatic             Eyes- Gross vision intact, PERRLA, conjunctivae and secretions clear            Ears- Hearing, canals-normal            Nose- Clear, no-Septal dev, mucus+thrush, polyps, erosion, perforation             Throat- Mallampati II-III, mucosa clear , drainage- none, tonsils- atrophic Neck- flexible , trachea midline, no stridor , thyroid nl, carotid no bruit Chest - symmetrical excursion , unlabored           Heart/CV- RRR faint , no murmur , no gallop  , no rub, nl s1 s2                           - JVD- none , edema- none, stasis changes- none, varices- none           Lung- clear to P&A, wheeze- none, cough- none , dullness-none, rub- none           Chest wall-  Abd-  Br/ Gen/ Rectal- Not done, not indicated Extrem- cyanosis- none, clubbing, none, atrophy- none, strength- nl Neuro- grossly intact to observation

## 2023-05-18 NOTE — Patient Instructions (Signed)
Order- walk test O2 qualifying  dx dyspnea on exertion  Order- sample Breztri inhaler    Inhale 2 puffs then rinse mouth, twice daily.  Try this instead of Symbicort.  Order- CXR   dx dyspnea on exertion  Order- Lab- CBC w diff, CMET, D-dimer, BNP     dx dyspnea on exertion

## 2023-05-19 DIAGNOSIS — J9611 Chronic respiratory failure with hypoxia: Secondary | ICD-10-CM | POA: Diagnosis not present

## 2023-05-19 LAB — CBC WITH DIFFERENTIAL/PLATELET
Basophils Absolute: 0.1 10*3/uL (ref 0.0–0.1)
Basophils Relative: 1.2 % (ref 0.0–3.0)
Eosinophils Absolute: 0.3 10*3/uL (ref 0.0–0.7)
Eosinophils Relative: 2.9 % (ref 0.0–5.0)
HCT: 43.9 % (ref 36.0–46.0)
Hemoglobin: 13.9 g/dL (ref 12.0–15.0)
Lymphocytes Relative: 24.1 % (ref 12.0–46.0)
Lymphs Abs: 2.7 10*3/uL (ref 0.7–4.0)
MCHC: 31.7 g/dL (ref 30.0–36.0)
MCV: 89.6 fl (ref 78.0–100.0)
Monocytes Absolute: 1.1 10*3/uL — ABNORMAL HIGH (ref 0.1–1.0)
Monocytes Relative: 9.4 % (ref 3.0–12.0)
Neutro Abs: 7 10*3/uL (ref 1.4–7.7)
Neutrophils Relative %: 62.4 % (ref 43.0–77.0)
Platelets: 227 10*3/uL (ref 150.0–400.0)
RBC: 4.89 Mil/uL (ref 3.87–5.11)
RDW: 15.6 % — ABNORMAL HIGH (ref 11.5–15.5)
WBC: 11.3 10*3/uL — ABNORMAL HIGH (ref 4.0–10.5)

## 2023-05-19 LAB — COMPREHENSIVE METABOLIC PANEL
ALT: 21 U/L (ref 0–35)
AST: 20 U/L (ref 0–37)
Albumin: 4.5 g/dL (ref 3.5–5.2)
Alkaline Phosphatase: 88 U/L (ref 39–117)
BUN: 15 mg/dL (ref 6–23)
CO2: 31 mEq/L (ref 19–32)
Calcium: 9.8 mg/dL (ref 8.4–10.5)
Chloride: 99 mEq/L (ref 96–112)
Creatinine, Ser: 1.22 mg/dL — ABNORMAL HIGH (ref 0.40–1.20)
GFR: 44.86 mL/min — ABNORMAL LOW (ref >60.00)
Glucose, Bld: 93 mg/dL (ref 70–99)
Potassium: 5.3 mEq/L — ABNORMAL HIGH (ref 3.5–5.1)
Sodium: 138 mEq/L (ref 135–145)
Total Bilirubin: 0.6 mg/dL (ref 0.2–1.2)
Total Protein: 7.4 g/dL (ref 6.0–8.3)

## 2023-05-19 LAB — BRAIN NATRIURETIC PEPTIDE: Pro B Natriuretic peptide (BNP): 42 pg/mL (ref 0.0–100.0)

## 2023-05-21 DIAGNOSIS — R296 Repeated falls: Secondary | ICD-10-CM | POA: Diagnosis not present

## 2023-05-21 DIAGNOSIS — M6281 Muscle weakness (generalized): Secondary | ICD-10-CM | POA: Diagnosis not present

## 2023-05-21 DIAGNOSIS — R42 Dizziness and giddiness: Secondary | ICD-10-CM | POA: Diagnosis not present

## 2023-05-21 DIAGNOSIS — R2681 Unsteadiness on feet: Secondary | ICD-10-CM | POA: Diagnosis not present

## 2023-05-21 DIAGNOSIS — R5383 Other fatigue: Secondary | ICD-10-CM | POA: Diagnosis not present

## 2023-05-27 ENCOUNTER — Telehealth: Payer: Self-pay

## 2023-05-27 DIAGNOSIS — R5383 Other fatigue: Secondary | ICD-10-CM | POA: Diagnosis not present

## 2023-05-27 DIAGNOSIS — R42 Dizziness and giddiness: Secondary | ICD-10-CM | POA: Diagnosis not present

## 2023-05-27 DIAGNOSIS — R2681 Unsteadiness on feet: Secondary | ICD-10-CM | POA: Diagnosis not present

## 2023-05-27 DIAGNOSIS — R296 Repeated falls: Secondary | ICD-10-CM | POA: Diagnosis not present

## 2023-05-27 DIAGNOSIS — M6281 Muscle weakness (generalized): Secondary | ICD-10-CM | POA: Diagnosis not present

## 2023-05-27 NOTE — Patient Outreach (Signed)
  Care Coordination   05/27/2023 Name: Sherry Riley MRN: [id number] DOB: [birth year]   Care Coordination Outreach Attempts:  An unsuccessful telephone outreach was attempted today to offer the patient information about available care coordination services.  Follow Up Plan:  Additional outreach attempts will be made to offer the patient care coordination information and services.   Encounter Outcome:  No Answer   Care Coordination Interventions:  No, not indicated    Rowe Pavy, RN, BSN, [facility] [phone]

## 2023-06-03 ENCOUNTER — Other Ambulatory Visit: Payer: Self-pay | Admitting: Cardiology

## 2023-06-11 DIAGNOSIS — R42 Dizziness and giddiness: Secondary | ICD-10-CM | POA: Diagnosis not present

## 2023-06-11 DIAGNOSIS — R296 Repeated falls: Secondary | ICD-10-CM | POA: Diagnosis not present

## 2023-06-11 DIAGNOSIS — R2681 Unsteadiness on feet: Secondary | ICD-10-CM | POA: Diagnosis not present

## 2023-06-11 DIAGNOSIS — R5383 Other fatigue: Secondary | ICD-10-CM | POA: Diagnosis not present

## 2023-06-11 DIAGNOSIS — M6281 Muscle weakness (generalized): Secondary | ICD-10-CM | POA: Diagnosis not present

## 2023-06-17 DIAGNOSIS — M6281 Muscle weakness (generalized): Secondary | ICD-10-CM | POA: Diagnosis not present

## 2023-06-17 DIAGNOSIS — R2681 Unsteadiness on feet: Secondary | ICD-10-CM | POA: Diagnosis not present

## 2023-06-17 DIAGNOSIS — R42 Dizziness and giddiness: Secondary | ICD-10-CM | POA: Diagnosis not present

## 2023-06-17 DIAGNOSIS — R296 Repeated falls: Secondary | ICD-10-CM | POA: Diagnosis not present

## 2023-06-17 DIAGNOSIS — R5383 Other fatigue: Secondary | ICD-10-CM | POA: Diagnosis not present

## 2023-06-18 DIAGNOSIS — K2960 Other gastritis without bleeding: Secondary | ICD-10-CM | POA: Diagnosis not present

## 2023-06-18 DIAGNOSIS — G720 Drug-induced myopathy: Secondary | ICD-10-CM | POA: Diagnosis not present

## 2023-06-18 DIAGNOSIS — K8020 Calculus of gallbladder without cholecystitis without obstruction: Secondary | ICD-10-CM | POA: Diagnosis not present

## 2023-06-18 DIAGNOSIS — Z6841 Body Mass Index (BMI) 40.0 and over, adult: Secondary | ICD-10-CM | POA: Diagnosis not present

## 2023-06-18 DIAGNOSIS — T466X5A Adverse effect of antihyperlipidemic and antiarteriosclerotic drugs, initial encounter: Secondary | ICD-10-CM | POA: Diagnosis not present

## 2023-06-18 DIAGNOSIS — E785 Hyperlipidemia, unspecified: Secondary | ICD-10-CM | POA: Diagnosis not present

## 2023-06-18 DIAGNOSIS — R7301 Impaired fasting glucose: Secondary | ICD-10-CM | POA: Diagnosis not present

## 2023-06-18 DIAGNOSIS — N1831 Chronic kidney disease, stage 3a: Secondary | ICD-10-CM | POA: Diagnosis not present

## 2023-06-18 DIAGNOSIS — Z79899 Other long term (current) drug therapy: Secondary | ICD-10-CM | POA: Diagnosis not present

## 2023-06-18 DIAGNOSIS — J353 Hypertrophy of tonsils with hypertrophy of adenoids: Secondary | ICD-10-CM | POA: Diagnosis not present

## 2023-06-18 DIAGNOSIS — Z0000 Encounter for general adult medical examination without abnormal findings: Secondary | ICD-10-CM | POA: Diagnosis not present

## 2023-06-19 DIAGNOSIS — J9611 Chronic respiratory failure with hypoxia: Secondary | ICD-10-CM | POA: Diagnosis not present

## 2023-06-21 ENCOUNTER — Encounter: Payer: Self-pay | Admitting: Internal Medicine

## 2023-06-21 DIAGNOSIS — J9611 Chronic respiratory failure with hypoxia: Secondary | ICD-10-CM

## 2023-06-21 HISTORY — DX: Chronic respiratory failure with hypoxia: J96.11

## 2023-06-21 NOTE — Assessment & Plan Note (Signed)
Contributes to dyspnea. Edema being treated.

## 2023-06-21 NOTE — Assessment & Plan Note (Signed)
Starting portable O2 2L Plan- CXR, CBC wdiff, CMET, Ddimer, BNP

## 2023-06-21 NOTE — Assessment & Plan Note (Signed)
Benefits from CPAP Plan- continue auto 5-15 

## 2023-06-25 ENCOUNTER — Other Ambulatory Visit: Payer: Self-pay | Admitting: Cardiology

## 2023-06-25 DIAGNOSIS — R42 Dizziness and giddiness: Secondary | ICD-10-CM | POA: Diagnosis not present

## 2023-06-25 DIAGNOSIS — M6281 Muscle weakness (generalized): Secondary | ICD-10-CM | POA: Diagnosis not present

## 2023-06-25 DIAGNOSIS — R2681 Unsteadiness on feet: Secondary | ICD-10-CM | POA: Diagnosis not present

## 2023-06-25 DIAGNOSIS — R5383 Other fatigue: Secondary | ICD-10-CM | POA: Diagnosis not present

## 2023-06-25 DIAGNOSIS — R296 Repeated falls: Secondary | ICD-10-CM | POA: Diagnosis not present

## 2023-06-26 DIAGNOSIS — K8020 Calculus of gallbladder without cholecystitis without obstruction: Secondary | ICD-10-CM | POA: Diagnosis not present

## 2023-06-26 DIAGNOSIS — R1011 Right upper quadrant pain: Secondary | ICD-10-CM | POA: Diagnosis not present

## 2023-07-02 DIAGNOSIS — R42 Dizziness and giddiness: Secondary | ICD-10-CM | POA: Diagnosis not present

## 2023-07-02 DIAGNOSIS — R296 Repeated falls: Secondary | ICD-10-CM | POA: Diagnosis not present

## 2023-07-02 DIAGNOSIS — R2681 Unsteadiness on feet: Secondary | ICD-10-CM | POA: Diagnosis not present

## 2023-07-02 DIAGNOSIS — R5383 Other fatigue: Secondary | ICD-10-CM | POA: Diagnosis not present

## 2023-07-02 DIAGNOSIS — M6281 Muscle weakness (generalized): Secondary | ICD-10-CM | POA: Diagnosis not present

## 2023-07-07 ENCOUNTER — Encounter: Payer: Self-pay | Admitting: Internal Medicine

## 2023-07-09 ENCOUNTER — Ambulatory Visit: Payer: Medicare HMO | Attending: Cardiology | Admitting: Internal Medicine

## 2023-07-09 ENCOUNTER — Other Ambulatory Visit (HOSPITAL_COMMUNITY): Payer: Self-pay

## 2023-07-09 ENCOUNTER — Encounter: Payer: Self-pay | Admitting: Internal Medicine

## 2023-07-09 ENCOUNTER — Telehealth: Payer: Self-pay

## 2023-07-09 DIAGNOSIS — Z888 Allergy status to other drugs, medicaments and biological substances status: Secondary | ICD-10-CM | POA: Diagnosis not present

## 2023-07-09 DIAGNOSIS — R931 Abnormal findings on diagnostic imaging of heart and coronary circulation: Secondary | ICD-10-CM | POA: Diagnosis not present

## 2023-07-09 DIAGNOSIS — T466X5A Adverse effect of antihyperlipidemic and antiarteriosclerotic drugs, initial encounter: Secondary | ICD-10-CM | POA: Diagnosis not present

## 2023-07-09 DIAGNOSIS — M791 Myalgia, unspecified site: Secondary | ICD-10-CM

## 2023-07-09 DIAGNOSIS — E785 Hyperlipidemia, unspecified: Secondary | ICD-10-CM

## 2023-07-09 DIAGNOSIS — I2510 Atherosclerotic heart disease of native coronary artery without angina pectoris: Secondary | ICD-10-CM

## 2023-07-09 NOTE — Progress Notes (Signed)
Virtual Visit via Video Note   Because of Sherry Riley's co-morbid illnesses, she is at least at moderate risk for complications without adequate follow up.  This format is felt to be most appropriate for this patient at this time.  All issues noted in this document were discussed and addressed.  A limited physical exam was performed with this format.  Please refer to the patient's chart for her consent to telehealth for [facility].      Date:  07/09/2023   ID:  Sherry Riley, DOB 03-26-1951, MRN [id number] The patient was identified using 2 identifiers.  Evaluation Performed:  New Patient Evaluation  Patient Location:  [address] [place] [address]  Provider location:   [address], [address] [place], [place] [address]  PCP:  Casper Harrison, Stephanie Coup, MD  Cardiologist:  None Electrophysiologist:  None   Chief Complaint: Manage dyslipidemia  History of Present Illness:    Sherry Riley is a 72 y.o. female who presents via audio/video conferencing for a telehealth visit today.  This is a pleasant 72 year old female kindly referred for evaluation management of dyslipidemia.  She was found by coronary CT to have mild to moderate nonobstructive coronary disease and aortic atherosclerosis back in April 2023.  Calcium score was 252, 86 percentile for age and sex matched controls.  Unfortunately she cannot tolerate statins having tried multiple statins in the past which caused allergic reaction/rash and myalgias.  Subsequently she was prescribed Nexletol but never got the medication filled because of the cost.  When discussing with her today, we did discuss about possibly restarting the Nexletol however she mentioned that she has a history of gallstones and that she has a history of gout in the past.  Both of these were seen at greater frequency on Nexletol then placebo and therefore this would not be an optimal medication for her.  We then discussed alternatives  including PCSK9 inhibitors.  This would be less likely to give her any significant side effects and would likely be more beneficial to help reach her target LDL less than 70.  Recent labs from August showed total cholesterol 266, triglycerides 219, HDL 57 and LDL of 165.  Again her goal LDL is less than 70.  I am not aware if she has had an LP(a).  Other risk factors include morbid obesity however recently she started on semaglutide and she has had about 10 pound weight loss.  Prior CV studies:   The following studies were reviewed today:  Chart reviewed, lab work  PMHx:  Past Medical History:  Diagnosis Date   Abscess of lower back 06/02/2017   Acute diastolic CHF (congestive heart failure) (HCC) 02/10/2020   Acute respiratory failure with hypoxia (HCC) 02/10/2020   Allergic rhinitis 02/09/2020   Asthma    Atherosclerosis of aorta (HCC)    CKD (chronic kidney disease), symptom management only, stage 3 (moderate) (HCC)    Community acquired pneumonia of left lower lobe of lung 02/29/2020   Bronch 02/21/20- Dr Chestine Spore- for L mass> Benign, lymphocytes, no malignancy   Complication of anesthesia    Hard to wake up.   Degenerative disc disease, lumbar 06/09/2013   Depression    Diastolic dysfunction    Left ventricle; noted on echo in 2021, EF low normal, LV dilated   Dysthymic disorder    Facet hypertrophy of lumbar region 06/09/2013   Fibromyalgia    Generalized hyperhidrosis    GERD (gastroesophageal reflux disease)  High blood pressure    High cholesterol    History of abscess of skin and subcutaneous tissue 08/13/2017   Hypertensive heart disease with chronic diastolic congestive heart failure (HCC)    Irritable bowel syndrome    Multiple lung nodules 07/01/2021   Bronch 02/21/20- Benign, lymphocytes, no malignancy CT chest 04/17/20- c/w benign   Obesity, Class III, BMI 40-49.9 (morbid obesity) (HCC) 02/09/2020   OSA (obstructive sleep apnea) 02/10/2020   HST 05/31/20- AHI 13.5/  hr, desaturation to 68% with mean 89%, body weight 285 lbs   Osteoarthritis 06/02/2017   Pneumonia    Pre-diabetes    Unspecified rotator cuff tear or rupture of left shoulder, not specified as traumatic 02/07/2020    Past Surgical History:  Procedure Laterality Date   ARTHROSCOPY KNEE W/ DRILLING Right    BIOPSY  02/21/2020   Procedure: BIOPSY;  Surgeon: Steffanie Dunn, DO;  Location: [facility];  Service: Cardiopulmonary;;   CYST EXCISION  2018   Back cyst   ENDOBRONCHIAL ULTRASOUND  02/21/2020   Procedure: ENDOBRONCHIAL ULTRASOUND;  Surgeon: Steffanie Dunn, DO;  Location: [facility];  Service: Cardiopulmonary;;   FINE NEEDLE ASPIRATION  02/21/2020   Procedure: FINE NEEDLE ASPIRATION (FNA) LINEAR;  Surgeon: Steffanie Dunn, DO;  Location: [facility];  Service: Cardiopulmonary;;   HERNIA REPAIR  2015   SHOULDER ARTHROSCOPY WITH BANKART REPAIR Left 02/07/2020   Procedure: SHOULDER ARTHROSCOPY WITH BANKART REPAIR;  Surgeon: Sheral Apley, MD;  Location: [facility];  Service: Orthopedics;  Laterality: Left;   VIDEO BRONCHOSCOPY  02/21/2020   Procedure: VIDEO BRONCHOSCOPY WITH FLUORO;  Surgeon: Steffanie Dunn, DO;  Location: [facility];  Service: Cardiopulmonary;;    FAMHx:  Family History  Problem Relation Age of Onset   Asthma Mother    Heart attack Mother    Diabetes Mother    Hypertension Mother    Lung cancer Father    Diabetes Brother     SOCHx:   reports that she quit smoking about 13 years ago. Her smoking use included cigarettes. She started smoking about 50 years ago. She has a 37 pack-year smoking history. She has been exposed to tobacco smoke. She has never used smokeless tobacco. She reports that she does not drink alcohol and does not use drugs.  ALLERGIES:  Allergies  Allergen Reactions   Atorvastatin Other (See Comments)    Myalgia   Lyrica [Pregabalin] Rash   Actonel [Risedronate Sodium]    Coreg [Carvedilol] Hives   Hydralazine Hcl Other (See Comments)  and Cough    Chest pain, dyspnea   Pravastatin     Other reaction(s): Other (see comments)   Wellbutrin [Bupropion] Tinitus   Ace Inhibitors Rash   Diltiazem Hcl Itching and Rash   Diovan Hct [Valsartan-Hydrochlorothiazide] Rash   Doxycycline Rash   Valsartan Rash    MEDS:  Current Meds  Medication Sig   albuterol (VENTOLIN HFA) 108 (90 Base) MCG/ACT inhaler Inhale 2 puffs into the lungs every 6 (six) hours as needed for wheezing or shortness of breath.   aspirin EC 81 MG tablet Take 1 tablet (81 mg total) by mouth daily. Swallow whole.   azelastine (ASTELIN) 0.1 % nasal spray Place 2 sprays into both nostrils daily as needed for rhinitis. Use in each nostril as directed   budesonide-formoterol (SYMBICORT) 160-4.5 MCG/ACT inhaler Inhale 1 puff into the lungs 2 (two) times daily. Rinse mouth   Calcium-Magnesium-Vitamin D (CALCIUM MAGNESIUM PO) Take 1,000 mg by mouth 4 (four)  times daily.    Cinnamon 500 MG TABS Take 1,000 mg by mouth 4 (four) times daily.   cloNIDine (CATAPRES) 0.1 MG tablet Take 1 tablet (0.1 mg total) by mouth 2 (two) times daily.   famotidine (PEPCID) 40 MG tablet Take 40 mg by mouth daily as needed for heartburn or indigestion.   furosemide (LASIX) 20 MG tablet Take 1 tablet (20 mg total) by mouth daily as needed for edema or fluid.   gabapentin (NEURONTIN) 400 MG capsule Take 400 mg by mouth 3 (three) times daily.   gabapentin (NEURONTIN) 600 MG tablet Take 600 mg by mouth 3 (three) times daily.   loratadine (CLARITIN) 10 MG tablet Take 10 mg by mouth daily.   LORazepam (ATIVAN) 0.5 MG tablet Take 0.5 mg by mouth 2 (two) times daily as needed for anxiety.   meclizine (ANTIVERT) 12.5 MG tablet Take 12.5 mg by mouth every 8 (eight) hours as needed for dizziness.   Misc Natural Products (OSTEO BI-FLEX JOINT SHIELD PO) Take 1 tablet by mouth in the morning and at bedtime.    nitroGLYCERIN (NITROSTAT) 0.4 MG SL tablet PLACE 1 TABLET UNDER THE TONGUE EVERY 5 MINUTES AS  NEEDED FOR CHEST PAIN.   polyethylene glycol (MIRALAX / GLYCOLAX) 17 g packet Take 17 g by mouth daily.   venlafaxine XR (EFFEXOR-XR) 75 MG 24 hr capsule Take 75 mg by mouth daily with breakfast.   verapamil (CALAN-SR) 180 MG CR tablet TAKE 1 TABLET BY MOUTH EVERY DAY   [DISCONTINUED] Bempedoic Acid 180 MG TABS Take 1 tablet (180 mg total) by mouth daily.     ROS: Pertinent items noted in HPI and remainder of comprehensive ROS otherwise negative.  Labs/Other Tests and Data Reviewed:    Recent Labs: 05/18/2023: ALT 21; BUN 15; Creatinine, Ser 1.22; Hemoglobin 13.9; Platelets 227.0; Potassium 5.3; Pro B Natriuretic peptide (BNP) 42.0; Sodium 138   Recent Lipid Panel No results found for: "CHOL", "TRIG", "HDL", "CHOLHDL", "LDLCALC", "LDLDIRECT"  Wt Readings from Last 3 Encounters:  05/18/23 (!) 303 lb 12.8 oz (137.8 kg)  02/05/23 (!) 301 lb (136.5 kg)  07/03/22 290 lb (131.5 kg)     Exam:    Vital Signs:  There were no vitals taken for this visit.   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  She has recently started on some semaglutide and has had 10 pound weight loss but even with the weight loss and dietary changes she is unlikely to reach her target LDL.  She cannot take statins due to rash or allergy which is an absolute contraindication.  She also had myalgias.  She was initially prescribed Nexletol but could not afford the medication  however on further review it is not a good option for her because she has a history of gallstones and prior gout.  These are common side effects of Nexletol.  A PCSK9 inhibitor would likely be better tolerated and had 50 to 60% reduction should get her close to target LDL.  We discussed pursuing Repatha and will reach out  for prior authorization for this.  Plan repeat lipids including NMR and LPA and follow-up with me in about 3 to 4 months.  Thanks as always for the kind referral.  Patient Risk:   After full review of this patients clinical status, I feel that they are at least moderate risk at this time.  Time:   Today, I have spent 25 minutes with the patient with telehealth technology discussing dyslipidemia.     Medication Adjustments/Labs and Tests Ordered: Current medicines are reviewed at length with the patient today.  Concerns regarding medicines are outlined above.   Tests Ordered: Orders Placed This Encounter  Procedures   NMR, lipoprofile   Lipoprotein A (LPA)    Medication Changes: No orders of the defined types were placed in this encounter.   Disposition:  in 4 month(s)  Chrystie Nose, MD, [facility], FACP  [place]  [facility]  Medical Director of the Advanced Lipid Disorders &  Cardiovascular Risk Reduction Clinic Diplomate of the American Board of Clinical Lipidology Attending Cardiologist  Direct Dial: [phone]  Fax: [phone]  Website:  [email or web address]  Chrystie Nose, MD  07/09/2023 9:07 AM

## 2023-07-09 NOTE — Patient Instructions (Addendum)
Medication Instructions:  Dr. Rennis Golden recommends Repatha Sureclick 140mg /mL. This is an injectable cholesterol medication self-administered once every 14 days. This medication will likely need prior approval with your insurance company, which we will work on. If the medication is not approved initially, we may need to do an appeal with your insurance.   Administer medication in area of fatty tissue such as abdomen, outer thigh, back of upper arm - and rotate site with each injection Store medication in refrigerator until ready to administer - allow to sit at room temp for 30 mins - 1 hour prior to injection Dispose of medication in a SHARPS container - your pharmacy should be able to direct you on this and proper disposal   If you need a co-pay card for Repatha: [email or web address].fr If you need a co-pay card for Praluent: [email or web address]  Patient Assistance:    These foundations have funds at various times.   The [employer] Foundation: https://www.panfoundation.org/disease-funds/hypercholesterolemia/ -- can sign up for wait list  The [facility] offers assistance to help pay for medication copays.  They will cover copays for all cholesterol lowering meds, including statins, fibrates, omega-3 fish oils like Vascepa, ezetimibe, Repatha, Praluent, Nexletol, Nexlizet.  The cards are usually good for $2,500 or 12 months, whichever comes first. Our fax # is [phone] (you will need this to apply) Go to healthwellfoundation.org Click on "Apply Now" Answer questions as to whom is applying (patient or representative) Your disease fund will be "hypercholesterolemia - Medicare access" They will ask questions about finances and which medications you are taking for cholesterol When you submit, the approval is usually within minutes.  You will need to print the card information from the site You will need to show this information to your pharmacy, they will  bill your Medicare Part D plan first -then bill Health Well --for the copay.   You can also call them at [phone], although the hold times can be quite long.     *If you need a refill on your cardiac medications before your next appointment, please call your pharmacy*   Lab Work: FASTING NMR lipoprofile and LPa in 3-4 months  ** complete about a week before next visit at [facility] in [facility] on corner of [facility]  If you have labs (blood work) drawn today and your tests are completely normal, you will receive your results only by: [employer] (if you have MyChart) OR A paper copy in the mail If you have any lab test that is abnormal or we need to change your treatment, we will call you to review the results.   Follow-Up: At [facility], you and your health needs are our priority.  As part of our continuing mission to provide you with exceptional heart care, we have created designated Provider Care Teams.  These Care Teams include your primary Cardiologist (physician) and Advanced Practice Providers (APPs -  Physician Assistants and Nurse Practitioners) who all work together to provide you with the care you need, when you need it.  We recommend signing up for the patient portal called "MyChart".  Sign up information is provided on this After Visit Summary.  MyChart is used to connect with patients for Virtual Visits (Telemedicine).  Patients are able to view lab/test results, encounter notes, upcoming appointments, etc.  Non-urgent messages can be sent to your provider as well.   To learn more about what you can do with MyChart, go to [email or web address].    Your next appointment:  3-4 months with Dr. Rennis Golden

## 2023-07-09 NOTE — Telephone Encounter (Signed)
Pharmacy Patient Advocate Encounter   Received notification from Physician's Office/ RN-JENNA E. that prior authorization for REPATHA is required/requested.   Insurance verification completed.   The patient is insured through [employer] [facility] .   Per test claim: PA required; PA submitted to [employer] [facility] via CoverMyMeds Key/confirmation #/EOC Key: [id number]     Status is pending

## 2023-07-10 ENCOUNTER — Other Ambulatory Visit (HOSPITAL_COMMUNITY): Payer: Self-pay

## 2023-07-10 NOTE — Telephone Encounter (Signed)
Pharmacy Patient Advocate Encounter  Received notification from  [employer]  that Prior Authorization for repatha has been APPROVED from 07/09/23 to 11/10/23. Ran test claim, Copay is $0.00. This test claim was processed through [facility] copay amounts may vary at other pharmacies due to pharmacy/plan contracts, or as the patient moves through the different stages of their insurance plan.   PA #/Case ID/Reference #: [id number]

## 2023-07-10 NOTE — Telephone Encounter (Signed)
Update sent to patient in MyChart 

## 2023-07-14 ENCOUNTER — Other Ambulatory Visit (HOSPITAL_COMMUNITY): Payer: Self-pay

## 2023-07-14 MED ORDER — REPATHA SURECLICK 140 MG/ML ~~LOC~~ SOAJ
140.0000 mg | SUBCUTANEOUS | 3 refills | Status: DC
  Filled 2023-07-14 – 2023-08-03 (×3): qty 6, 84d supply, fill #0
  Filled 2023-11-30: qty 2, 28d supply, fill #1
  Filled 2023-12-26: qty 2, 28d supply, fill #2
  Filled 2024-01-25: qty 2, 28d supply, fill #3
  Filled 2024-02-22 (×2): qty 2, 28d supply, fill #4
  Filled 2024-03-30: qty 2, 28d supply, fill #5
  Filled 2024-05-12: qty 2, 28d supply, fill #6
  Filled 2024-06-23 – 2024-06-24 (×3): qty 2, 28d supply, fill #7

## 2023-07-14 NOTE — Addendum Note (Signed)
Addended by: Lindell Spar on: 07/14/2023 07:58 AM   Modules accepted: Orders

## 2023-07-17 NOTE — Telephone Encounter (Signed)
Repatha rx sent to pharmacy. Follow up appointment pending.

## 2023-07-20 DIAGNOSIS — Z6841 Body Mass Index (BMI) 40.0 and over, adult: Secondary | ICD-10-CM | POA: Diagnosis not present

## 2023-07-20 DIAGNOSIS — J9611 Chronic respiratory failure with hypoxia: Secondary | ICD-10-CM | POA: Diagnosis not present

## 2023-07-20 DIAGNOSIS — J441 Chronic obstructive pulmonary disease with (acute) exacerbation: Secondary | ICD-10-CM | POA: Diagnosis not present

## 2023-07-20 DIAGNOSIS — J410 Simple chronic bronchitis: Secondary | ICD-10-CM | POA: Diagnosis not present

## 2023-07-24 ENCOUNTER — Other Ambulatory Visit (HOSPITAL_COMMUNITY): Payer: Self-pay

## 2023-07-31 ENCOUNTER — Other Ambulatory Visit: Payer: Self-pay | Admitting: Internal Medicine

## 2023-08-03 ENCOUNTER — Other Ambulatory Visit (HOSPITAL_COMMUNITY): Payer: Self-pay

## 2023-08-04 ENCOUNTER — Other Ambulatory Visit: Payer: Self-pay

## 2023-08-06 DIAGNOSIS — M1711 Unilateral primary osteoarthritis, right knee: Secondary | ICD-10-CM

## 2023-08-06 DIAGNOSIS — M25561 Pain in right knee: Secondary | ICD-10-CM

## 2023-08-06 HISTORY — DX: Pain in right knee: M25.561

## 2023-08-06 HISTORY — DX: Unilateral primary osteoarthritis, right knee: M17.11

## 2023-08-15 NOTE — Progress Notes (Signed)
HPI  F former smoker followed for OSA, complicated by CHF, Allergic Rhinitis, Asthma, OHS, Morbid Obesity, Bronch 02/21/20- Dr Chestine Spore- for L mass> Benign, lymphocytes, no malignancy PFT 05/31/20- slight restriction of exhaled volume, Increased DLCO/VA, no resp to BD HST 05/31/20- AHI 13.5/ hr, desaturation to 68% with mean 89%, body weight 285 lbs CPAP  Auto 5-15/ [employer] new 08/08/20  Walk Test on room air 05/18/23- desat to 2L. Will start home O2 2l portable. ------------------------------------------------------------  05/18/23- 71 yoF former smoker(36 pkyrs) followed for OSA, complicated by CHF, ASCVD, , Allergic Rhinitis, Asthma, OHS, Morbid Obesity, Hyperlipidemia, Hx AdenoCA L Lung, Morbid Obesity,  -Symbicort 160, Flonase, Neb Atrovent 0.02%/ Albuterol, Ventolin hfa,  CPAP auto 5-15/ [employer]              AirSense 11 AutoSet                 Download-compliance      87%, AHI 5.2/ hr                Body weight today-303 lbs CT chest low dose screening program- Cardiology following for ASCVD/CAD with peripheral edema. ------Sob during exertion. Ongoing for a week  PT stopped due to desaturation. Download reviewed- CPAP doing well. Increased DOE x 2 weeks- more labored. More cough-clear phlegm. Got med for edema. Using inhalers but not nebulizer. Walk Test on room air 05/18/23- desat to 2L. Will start home O2 2l portable.   08/17/23- 72 yoF former smoker(36 pkyrs) followed for OSA, Chronic Hypoxic Respiratory Failure, complicated by CHF, ASCVD, , Allergic Rhinitis, Asthma, OHS, Morbid Obesity, Hyperlipidemia, Hx AdenoCA L Lung,  -Breztri,  Flonase, Neb Atrovent 0.02%/ Albuterol, Ventolin hfa,  O2 2L/ [employer] CPAP auto 5-15/ [employer]              AirSense 11 AutoSet                 Download-compliance                    Body weight today-292 lbs CT chest low dose screening program- Cardiology following for ASCVD/CAD with peripheral edema. Will get flu vax at PCP. -----No issues at this time. Doing  well! CXR 05/22/23- No active cardiopulmonary disease. No evidence of pneumonia or pulmonary edema.  ROS-see HPI   + = positive Constitutional:    weight loss, night sweats, fevers, chills, fatigue, lassitude. HEENT:   + headaches, difficulty swallowing,+ tooth/dental problems, sore throat,       sneezing, itching, ear ache, nasal congestion, post nasal drip, snoring CV:    chest pain, orthopnea, PND, +swelling in lower extremities, anasarca,                                   dizziness, palpitations Resp:   +shortness of breath with exertion or at rest.                +productive  non-productive cough, coughing up of blood.              change in color of mucus.  wheezing.   Skin:    rash or lesions. GI:  +heartburn, indigestion, abdominal pain, nausea, vomiting, diarrhea,                 change in bowel habits, loss of appetite GU: dysuria, change in color of urine, no urgency or frequency.   flank pain.  MS:   +joint pain, stiffness, decreased range of motion, back pain. Neuro-     nothing unusual Psych:  change in mood or affect.  depression or +anxiety.   memory loss.  OBJ- Physical Exam General- Alert, Oriented, Affect-appropriate, Distress- none acute, + morbidly obese Skin- rash-none, lesions- none, excoriation- none Lymphadenopathy- none Head- atraumatic            Eyes- Gross vision intact, PERRLA, conjunctivae and secretions clear            Ears- Hearing, canals-normal            Nose- Clear, no-Septal dev, mucus+thrush, polyps, erosion, perforation             Throat- Mallampati II-III, mucosa clear , drainage- none, tonsils- atrophic Neck- flexible , trachea midline, no stridor , thyroid nl, carotid no bruit Chest - symmetrical excursion , unlabored           Heart/CV- RRR faint , no murmur , no gallop  , no rub, nl s1 s2                           - JVD- none , edema- none, stasis changes- none, varices- none           Lung- clear to P&A, wheeze- none, cough- none ,  dullness-none, rub- none           Chest wall-  Abd-  Br/ Gen/ Rectal- Not done, not indicated Extrem- cyanosis- none, clubbing, none, atrophy- none, strength- nl Neuro- grossly intact to observation

## 2023-08-17 ENCOUNTER — Encounter: Payer: Self-pay | Admitting: Internal Medicine

## 2023-08-17 ENCOUNTER — Ambulatory Visit: Payer: Medicare HMO | Admitting: Internal Medicine

## 2023-08-17 VITALS — BP 118/81 | HR 72 | Temp 98.5°F | Ht 65.0 in | Wt 292.0 lb

## 2023-08-17 DIAGNOSIS — G4733 Obstructive sleep apnea (adult) (pediatric): Secondary | ICD-10-CM

## 2023-08-17 DIAGNOSIS — J9611 Chronic respiratory failure with hypoxia: Secondary | ICD-10-CM | POA: Diagnosis not present

## 2023-08-17 MED ORDER — ALBUTEROL SULFATE HFA 108 (90 BASE) MCG/ACT IN AERS: INHALATION_SPRAY | RESPIRATORY_TRACT | 12 refills | Status: DC

## 2023-08-17 MED ORDER — BREZTRI AEROSPHERE 160-9-4.8 MCG/ACT IN AERO: INHALATION_SPRAY | RESPIRATORY_TRACT | 12 refills | Status: DC

## 2023-08-17 NOTE — Patient Instructions (Signed)
Glad you are doing well- Continue oxygen 2 L and CPAP when you sleep.  Refills sent for Breztri inhaler and for albuterol rescue inhaler  Please call if we can help

## 2023-08-19 DIAGNOSIS — J9611 Chronic respiratory failure with hypoxia: Secondary | ICD-10-CM | POA: Diagnosis not present

## 2023-08-25 DIAGNOSIS — M1711 Unilateral primary osteoarthritis, right knee: Secondary | ICD-10-CM | POA: Diagnosis not present

## 2023-08-25 DIAGNOSIS — M170 Bilateral primary osteoarthritis of knee: Secondary | ICD-10-CM | POA: Diagnosis not present

## 2023-09-02 ENCOUNTER — Encounter: Payer: Self-pay | Admitting: Internal Medicine

## 2023-09-02 NOTE — Assessment & Plan Note (Signed)
Benefits from CPAP with good compliance and control Plan- continue auto 5-15 with O2 2L

## 2023-09-02 NOTE — Assessment & Plan Note (Signed)
Is not making any real effort to lose weight.

## 2023-09-02 NOTE — Assessment & Plan Note (Signed)
Benefits from O2 Plan- continue O2 2L with sleep and exertion

## 2023-09-10 ENCOUNTER — Other Ambulatory Visit: Payer: Self-pay | Admitting: Cardiology

## 2023-09-17 DIAGNOSIS — M1711 Unilateral primary osteoarthritis, right knee: Secondary | ICD-10-CM | POA: Diagnosis not present

## 2023-09-19 DIAGNOSIS — J9611 Chronic respiratory failure with hypoxia: Secondary | ICD-10-CM | POA: Diagnosis not present

## 2023-09-24 DIAGNOSIS — M1711 Unilateral primary osteoarthritis, right knee: Secondary | ICD-10-CM | POA: Diagnosis not present

## 2023-10-01 DIAGNOSIS — M1711 Unilateral primary osteoarthritis, right knee: Secondary | ICD-10-CM | POA: Diagnosis not present

## 2023-10-02 ENCOUNTER — Other Ambulatory Visit: Payer: Self-pay | Admitting: Cardiology

## 2023-10-07 DIAGNOSIS — R1010 Upper abdominal pain, unspecified: Secondary | ICD-10-CM | POA: Diagnosis not present

## 2023-10-07 DIAGNOSIS — Z8719 Personal history of other diseases of the digestive system: Secondary | ICD-10-CM | POA: Diagnosis not present

## 2023-10-07 DIAGNOSIS — K5909 Other constipation: Secondary | ICD-10-CM | POA: Diagnosis not present

## 2023-10-08 LAB — NMR, LIPOPROFILE
Cholesterol, Total: 164 mg/dL (ref 100–199)
HDL Particle Number: 45.7 umol/L (ref >=30.5)
HDL-C: 66 mg/dL (ref >39)
LDL Particle Number: 876 nmol/L (ref <1000)
LDL Size: 21.1 nmol (ref >20.5)
LDL-C (NIH Calc): 71 mg/dL (ref 0–99)
LP-IR Score: 52 — ABNORMAL HIGH (ref <=45)
Small LDL Particle Number: 445 nmol/L (ref <=527)
Triglycerides: 163 mg/dL — ABNORMAL HIGH (ref 0–149)

## 2023-10-08 LAB — LIPOPROTEIN A (LPA): Lipoprotein (a): 71.1 nmol/L (ref <75.0)

## 2023-10-15 DIAGNOSIS — K8020 Calculus of gallbladder without cholecystitis without obstruction: Secondary | ICD-10-CM | POA: Diagnosis not present

## 2023-10-15 DIAGNOSIS — R1010 Upper abdominal pain, unspecified: Secondary | ICD-10-CM | POA: Diagnosis not present

## 2023-10-16 ENCOUNTER — Encounter: Payer: Self-pay | Admitting: Internal Medicine

## 2023-10-16 ENCOUNTER — Ambulatory Visit: Payer: Medicare HMO | Attending: Cardiology | Admitting: Internal Medicine

## 2023-10-16 VITALS — BP 120/74 | Ht 65.0 in | Wt 293.0 lb

## 2023-10-16 DIAGNOSIS — R931 Abnormal findings on diagnostic imaging of heart and coronary circulation: Secondary | ICD-10-CM | POA: Diagnosis not present

## 2023-10-16 DIAGNOSIS — E785 Hyperlipidemia, unspecified: Secondary | ICD-10-CM

## 2023-10-16 DIAGNOSIS — T466X5A Adverse effect of antihyperlipidemic and antiarteriosclerotic drugs, initial encounter: Secondary | ICD-10-CM

## 2023-10-16 DIAGNOSIS — M791 Myalgia, unspecified site: Secondary | ICD-10-CM

## 2023-10-16 NOTE — Patient Instructions (Signed)
Medication Instructions:  NO CHANGES  *If you need a refill on your cardiac medications before your next appointment, please call your pharmacy*   Lab Work: FASTING NMR lipoprofile in 1 year -- before next appointment  Follow-Up: At [facility], you and your health needs are our priority.  As part of our continuing mission to provide you with exceptional heart care, we have created designated Provider Care Teams.  These Care Teams include your primary Cardiologist (physician) and Advanced Practice Providers (APPs -  Physician Assistants and Nurse Practitioners) who all work together to provide you with the care you need, when you need it.  We recommend signing up for the patient portal called "MyChart".  Sign up information is provided on this After Visit Summary.  MyChart is used to connect with patients for Virtual Visits (Telemedicine).  Patients are able to view lab/test results, encounter notes, upcoming appointments, etc.  Non-urgent messages can be sent to your provider as well.   To learn more about what you can do with MyChart, go to [email or web address].    Your next appointment:    12 months with Dr. Rennis Golden  HAPPY HOLIDAYS!!!

## 2023-10-16 NOTE — Progress Notes (Signed)
Virtual Visit via Video Note   Because of Sherry Riley's co-morbid illnesses, she is at least at moderate risk for complications without adequate follow up.  This format is felt to be most appropriate for this patient at this time.  All issues noted in this document were discussed and addressed.  A limited physical exam was performed with this format.  Please refer to the patient's chart for her consent to telehealth for [facility].      Date:  10/16/2023   ID:  Sherry Riley, DOB 11-28-1950, MRN [id number] The patient was identified using 2 identifiers.  Evaluation Performed:  Follow-Up Visit  Patient Location:  [address] [place] [address]  Provider location:   [address] [place], [place] [address]  PCP:  Casper Harrison, Stephanie Coup, MD  Cardiologist:  None Electrophysiologist:  None   Chief Complaint: Manage dyslipidemia  History of Present Illness:    Sherry Riley is a 72 y.o. female who presents via audio/video conferencing for a telehealth visit today.  This is a pleasant 72 year old female kindly referred for evaluation management of dyslipidemia.  She was found by coronary CT to have mild to moderate nonobstructive coronary disease and aortic atherosclerosis back in April 2023.  Calcium score was 252, 86 percentile for age and sex matched controls.  Unfortunately she cannot tolerate statins having tried multiple statins in the past which caused allergic reaction/rash and myalgias.  Subsequently she was prescribed Nexletol but never got the medication filled because of the cost.  When discussing with her today, we did discuss about possibly restarting the Nexletol however she mentioned that she has a history of gallstones and that she has a history of gout in the past.  Both of these were seen at greater frequency on Nexletol then placebo and therefore this would not be an optimal medication for her.  We then discussed alternatives  including PCSK9 inhibitors.  This would be less likely to give her any significant side effects and would likely be more beneficial to help reach her target LDL less than 70.  Recent labs from August showed total cholesterol 266, triglycerides 219, HDL 57 and LDL of 165.  Again her goal LDL is less than 70.  I am not aware if she has had an LP(a).  Other risk factors include morbid obesity however recently she started on semaglutide and she has had about 10 pound weight loss.  10/16/2023  Sherry Riley returns today for follow-up.  She is doing very well on Repatha.  She has not had any the side effects she had with the statin medications.  Her LDL particle number is at 621 now with an LDL of 71, HDL 66, triglycerides 163 and small LDL particle number of 445.  LDL particle size is elevated at 21.1 nm.  Additionally, her LP(a) is negative at 71.1 nmol/L.  We discussed that it may have actually been higher than this is typically Repatha will lower this about 20 to 30%.  Overall at this point it is in the normal range.  Prior CV studies:   The following studies were reviewed today:  Chart reviewed, lab work  PMHx:  Past Medical History:  Diagnosis Date   Abscess of lower back 06/02/2017   Acute diastolic CHF (congestive heart failure) (HCC) 02/10/2020   Acute respiratory failure with hypoxia (HCC) 02/10/2020   Allergic rhinitis 02/09/2020   Asthma    Atherosclerosis of aorta (HCC)    CKD (  chronic kidney disease), symptom management only, stage 3 (moderate) (HCC)    Community acquired pneumonia of left lower lobe of lung 02/29/2020   Bronch 02/21/20- Dr Chestine Spore- for L mass> Benign, lymphocytes, no malignancy   Complication of anesthesia    Hard to wake up.   Degenerative disc disease, lumbar 06/09/2013   Depression    Diastolic dysfunction    Left ventricle; noted on echo in 2021, EF low normal, LV dilated   Dysthymic disorder    Facet hypertrophy of lumbar region 06/09/2013   Fibromyalgia     Generalized hyperhidrosis    GERD (gastroesophageal reflux disease)    High blood pressure    High cholesterol    History of abscess of skin and subcutaneous tissue 08/13/2017   Hypertensive heart disease with chronic diastolic congestive heart failure (HCC)    Irritable bowel syndrome    Multiple lung nodules 07/01/2021   Bronch 02/21/20- Benign, lymphocytes, no malignancy CT chest 04/17/20- c/w benign   Obesity, Class III, BMI 40-49.9 (morbid obesity) (HCC) 02/09/2020   OSA (obstructive sleep apnea) 02/10/2020   HST 05/31/20- AHI 13.5/ hr, desaturation to 68% with mean 89%, body weight 285 lbs   Osteoarthritis 06/02/2017   Pneumonia    Pre-diabetes    Unspecified rotator cuff tear or rupture of left shoulder, not specified as traumatic 02/07/2020    Past Surgical History:  Procedure Laterality Date   ARTHROSCOPY KNEE W/ DRILLING Right    BIOPSY  02/21/2020   Procedure: BIOPSY;  Surgeon: Steffanie Dunn, DO;  Location: [facility];  Service: Cardiopulmonary;;   CYST EXCISION  2018   Back cyst   ENDOBRONCHIAL ULTRASOUND  02/21/2020   Procedure: ENDOBRONCHIAL ULTRASOUND;  Surgeon: Steffanie Dunn, DO;  Location: [facility];  Service: Cardiopulmonary;;   FINE NEEDLE ASPIRATION  02/21/2020   Procedure: FINE NEEDLE ASPIRATION (FNA) LINEAR;  Surgeon: Steffanie Dunn, DO;  Location: [facility];  Service: Cardiopulmonary;;   HERNIA REPAIR  2015   SHOULDER ARTHROSCOPY WITH BANKART REPAIR Left 02/07/2020   Procedure: SHOULDER ARTHROSCOPY WITH BANKART REPAIR;  Surgeon: Sheral Apley, MD;  Location: [facility];  Service: Orthopedics;  Laterality: Left;   VIDEO BRONCHOSCOPY  02/21/2020   Procedure: VIDEO BRONCHOSCOPY WITH FLUORO;  Surgeon: Steffanie Dunn, DO;  Location: [facility];  Service: Cardiopulmonary;;    FAMHx:  Family History  Problem Relation Age of Onset   Asthma Mother    Heart attack Mother    Diabetes Mother    Hypertension Mother    Lung cancer Father    Diabetes Brother      SOCHx:   reports that she quit smoking about 13 years ago. Her smoking use included cigarettes. She started smoking about 50 years ago. She has a 37 pack-year smoking history. She has been exposed to tobacco smoke. She has never used smokeless tobacco. She reports that she does not drink alcohol and does not use drugs.  ALLERGIES:  Allergies  Allergen Reactions   Atorvastatin Other (See Comments)    Myalgia   Lyrica [Pregabalin] Rash   Actonel [Risedronate Sodium]    Coreg [Carvedilol] Hives   Hydralazine Hcl Other (See Comments) and Cough    Chest pain, dyspnea   Pravastatin     Other reaction(s): Other (see comments)   Wellbutrin [Bupropion] Tinitus   Ace Inhibitors Rash   Diltiazem Hcl Itching and Rash   Diovan Hct [Valsartan-Hydrochlorothiazide] Rash   Doxycycline Rash   Valsartan Rash    MEDS:  Current Meds  Medication Sig   albuterol (VENTOLIN HFA) 108 (90 Base) MCG/ACT inhaler Inhale 2 puffs every 6 hours as needed   aspirin EC 81 MG tablet Take 1 tablet (81 mg total) by mouth daily. Swallow whole.   azelastine (ASTELIN) 0.1 % nasal spray Place 2 sprays into both nostrils daily as needed for rhinitis. Use in each nostril as directed   Budeson-Glycopyrrol-Formoterol (BREZTRI AEROSPHERE) 160-9-4.8 MCG/ACT AERO Inhale 2 puffs then rinse mouth, twice daily   Calcium-Magnesium-Vitamin D (CALCIUM MAGNESIUM PO) Take 1,000 mg by mouth 4 (four) times daily.    Cinnamon 500 MG TABS Take 1,000 mg by mouth 4 (four) times daily.   cloNIDine (CATAPRES) 0.1 MG tablet Take 1 tablet (0.1 mg total) by mouth 2 (two) times daily.   Evolocumab (REPATHA SURECLICK) 140 MG/ML SOAJ Inject 140 mg into the skin every 14 (fourteen) days.   famotidine (PEPCID) 40 MG tablet Take 40 mg by mouth daily as needed for heartburn or indigestion.   furosemide (LASIX) 20 MG tablet Take 1 tablet (20 mg total) by mouth daily as needed for edema or fluid.   gabapentin (NEURONTIN) 400 MG capsule Take 400 mg  by mouth 3 (three) times daily.   gabapentin (NEURONTIN) 600 MG tablet Take 600 mg by mouth 3 (three) times daily.   loratadine (CLARITIN) 10 MG tablet Take 10 mg by mouth daily.   LORazepam (ATIVAN) 0.5 MG tablet Take 0.5 mg by mouth 2 (two) times daily as needed for anxiety.   meclizine (ANTIVERT) 12.5 MG tablet Take 12.5 mg by mouth every 8 (eight) hours as needed for dizziness.   Misc Natural Products (OSTEO BI-FLEX JOINT SHIELD PO) Take 1 tablet by mouth in the morning and at bedtime.    nitroGLYCERIN (NITROSTAT) 0.4 MG SL tablet PLACE 1 TABLET UNDER THE TONGUE EVERY 5 MINUTES AS NEEDED FOR CHEST PAIN.   polyethylene glycol (MIRALAX / GLYCOLAX) 17 g packet Take 17 g by mouth daily.   venlafaxine XR (EFFEXOR-XR) 75 MG 24 hr capsule Take 75 mg by mouth daily with breakfast.   verapamil (CALAN-SR) 180 MG CR tablet TAKE 1 TABLET BY MOUTH EVERY DAY     ROS: Pertinent items noted in HPI and remainder of comprehensive ROS otherwise negative.  Labs/Other Tests and Data Reviewed:    Recent Labs: 05/18/2023: ALT 21; BUN 15; Creatinine, Ser 1.22; Hemoglobin 13.9; Platelets 227.0; Potassium 5.3; Pro B Natriuretic peptide (BNP) 42.0; Sodium 138   Recent Lipid Panel No results found for: "CHOL", "TRIG", "HDL", "CHOLHDL", "LDLCALC", "LDLDIRECT"  Wt Readings from Last 3 Encounters:  10/16/23 293 lb (132.9 kg)  08/17/23 292 lb (132.5 kg)  05/18/23 (!) 303 lb 12.8 oz (137.8 kg)     Exam:    Vital Signs:  BP 120/74   Ht 5\' 5"  (1.651 m)   Wt 293 lb (132.9 kg)   BMI 48.76 kg/m    General appearance: alert and no distress Lungs: No visual respiratory difficulty Abdomen: Morbidly obese Extremities: extremities normal, atraumatic, no cyanosis or edema Neurologic: Grossly normal  ASSESSMENT & PLAN:    Mixed dyslipidemia, goal LDL less than 70 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  All of her parameters are now well-controlled with LDL 1 point higher than target.  Very good response with no side effects at this point.  Will continue this therapy.  Plan repeat lipid NMR in 1 year and will reauthorize the Repatha for ongoing use.  Follow-up with me at that time.  Patient Risk:   After full review of this patients clinical status, I feel that they are at least moderate risk at this time.  Time:   Today, I have spent 15 minutes with the patient with telehealth technology discussing dyslipidemia.     Medication Adjustments/Labs and Tests Ordered: Current medicines are reviewed at length with the patient today.  Concerns regarding medicines are outlined above.   Tests Ordered: No orders of the defined types were placed in this encounter.   Medication Changes: No orders of the defined types were placed in this encounter.   Disposition:  in 1 year(s)  Chrystie Nose, MD, [facility], FACP  [facility]  Medical Director of the [facility] Lipid [facility] &  Cardiovascular Risk Reduction Clinic Diplomate of the American Board of Clinical Lipidology Attending Cardiologist  Direct Dial: [phone]  Fax: [phone]  Website:  [email or web address]  Chrystie Nose, MD  10/16/2023 8:03 AM

## 2023-10-19 DIAGNOSIS — J9611 Chronic respiratory failure with hypoxia: Secondary | ICD-10-CM | POA: Diagnosis not present

## 2023-10-26 DIAGNOSIS — E662 Morbid (severe) obesity with alveolar hypoventilation: Secondary | ICD-10-CM | POA: Diagnosis not present

## 2023-10-26 DIAGNOSIS — I110 Hypertensive heart disease with heart failure: Secondary | ICD-10-CM | POA: Diagnosis not present

## 2023-10-26 DIAGNOSIS — J410 Simple chronic bronchitis: Secondary | ICD-10-CM | POA: Diagnosis not present

## 2023-10-26 DIAGNOSIS — I771 Stricture of artery: Secondary | ICD-10-CM | POA: Diagnosis not present

## 2023-10-26 DIAGNOSIS — E785 Hyperlipidemia, unspecified: Secondary | ICD-10-CM | POA: Diagnosis not present

## 2023-10-26 DIAGNOSIS — G720 Drug-induced myopathy: Secondary | ICD-10-CM | POA: Diagnosis not present

## 2023-10-26 DIAGNOSIS — I25119 Atherosclerotic heart disease of native coronary artery with unspecified angina pectoris: Secondary | ICD-10-CM | POA: Diagnosis not present

## 2023-10-26 DIAGNOSIS — G4733 Obstructive sleep apnea (adult) (pediatric): Secondary | ICD-10-CM | POA: Diagnosis not present

## 2023-10-26 DIAGNOSIS — I5032 Chronic diastolic (congestive) heart failure: Secondary | ICD-10-CM | POA: Diagnosis not present

## 2023-10-26 DIAGNOSIS — Z23 Encounter for immunization: Secondary | ICD-10-CM | POA: Diagnosis not present

## 2023-10-26 DIAGNOSIS — I700 Atherosclerosis of aorta: Secondary | ICD-10-CM | POA: Diagnosis not present

## 2023-10-26 DIAGNOSIS — T466X5A Adverse effect of antihyperlipidemic and antiarteriosclerotic drugs, initial encounter: Secondary | ICD-10-CM | POA: Diagnosis not present

## 2023-11-30 ENCOUNTER — Other Ambulatory Visit (HOSPITAL_COMMUNITY): Payer: Self-pay

## 2023-12-15 ENCOUNTER — Other Ambulatory Visit (HOSPITAL_COMMUNITY): Payer: Self-pay

## 2023-12-15 ENCOUNTER — Telehealth: Payer: Self-pay | Admitting: Pharmacy Technician

## 2023-12-15 DIAGNOSIS — H35372 Puckering of macula, left eye: Secondary | ICD-10-CM | POA: Diagnosis not present

## 2023-12-15 DIAGNOSIS — H524 Presbyopia: Secondary | ICD-10-CM | POA: Diagnosis not present

## 2023-12-15 NOTE — Telephone Encounter (Signed)
 Pharmacy Patient Advocate Encounter   Received notification from  [employer]  that prior authorization for {Repatha  SureClick 140MG /ML auto-injectors is required/requested.   Insurance verification completed.   The patient is insured through [employer] .   Per test claim: PA required; PA submitted to above mentioned insurance via CoverMyMeds Key/confirmation #/EOC [facility] Status is pending

## 2023-12-16 ENCOUNTER — Other Ambulatory Visit (HOSPITAL_COMMUNITY): Payer: Self-pay

## 2023-12-16 NOTE — Telephone Encounter (Signed)
 Pharmacy Patient Advocate Encounter  Received notification from [facility] that Prior Authorization for Repatha  SureClick 140MG /ML auto-injectors has been APPROVED from 12/15/23 to 06/13/24. Unable to obtain price due to refill too soon rejection, last fill date 11/30/23 next available fill date2/17/25   PA #/Case ID/Reference #: [id number]

## 2023-12-16 NOTE — Telephone Encounter (Signed)
 Called patient with no answer. Left message to return call.

## 2023-12-17 ENCOUNTER — Telehealth: Payer: Self-pay | Admitting: Internal Medicine

## 2023-12-17 NOTE — Telephone Encounter (Signed)
 Patient was returning phone call back about medication

## 2023-12-17 NOTE — Telephone Encounter (Signed)
 Called and spoke to patient. Verified name and DOB. Informed patient Repatha  PA approved.

## 2023-12-20 DIAGNOSIS — J9611 Chronic respiratory failure with hypoxia: Secondary | ICD-10-CM | POA: Diagnosis not present

## 2023-12-26 ENCOUNTER — Other Ambulatory Visit (HOSPITAL_COMMUNITY): Payer: Self-pay

## 2024-01-17 DIAGNOSIS — J9611 Chronic respiratory failure with hypoxia: Secondary | ICD-10-CM | POA: Diagnosis not present

## 2024-01-25 ENCOUNTER — Other Ambulatory Visit (HOSPITAL_COMMUNITY): Payer: Self-pay

## 2024-01-26 ENCOUNTER — Other Ambulatory Visit: Payer: Self-pay | Admitting: Acute Care

## 2024-01-26 DIAGNOSIS — Z122 Encounter for screening for malignant neoplasm of respiratory organs: Secondary | ICD-10-CM

## 2024-01-26 DIAGNOSIS — Z1231 Encounter for screening mammogram for malignant neoplasm of breast: Secondary | ICD-10-CM | POA: Diagnosis not present

## 2024-01-26 DIAGNOSIS — Z87891 Personal history of nicotine dependence: Secondary | ICD-10-CM

## 2024-02-11 ENCOUNTER — Ambulatory Visit: Payer: Medicare HMO | Attending: Cardiology

## 2024-02-11 VITALS — BP 132/76 | HR 76 | Ht 65.0 in | Wt 296.0 lb

## 2024-02-11 DIAGNOSIS — I5032 Chronic diastolic (congestive) heart failure: Secondary | ICD-10-CM

## 2024-02-11 DIAGNOSIS — I2510 Atherosclerotic heart disease of native coronary artery without angina pectoris: Secondary | ICD-10-CM | POA: Diagnosis not present

## 2024-02-11 DIAGNOSIS — I10 Essential (primary) hypertension: Secondary | ICD-10-CM

## 2024-02-11 DIAGNOSIS — E782 Mixed hyperlipidemia: Secondary | ICD-10-CM

## 2024-02-11 DIAGNOSIS — E66813 Obesity, class 3: Secondary | ICD-10-CM

## 2024-02-11 NOTE — Assessment & Plan Note (Signed)
 Well-controlled. Continue her current medication verapamil 180 mg once daily, clonidine 0.1 mg 2 times daily. Target blood pressure below 130/80 mmHg.

## 2024-02-11 NOTE — Patient Instructions (Signed)
 Medication Instructions:  Your physician has recommended you make the following change in your medication:   Keep a daily log of your weight and if you gain 2 pounds in a day or 4-5 pounds in a week you need to take your Lasix. Keep a diet low in sodium 2 grams or less.  *If you need a refill on your cardiac medications before your next appointment, please call your pharmacy*   Lab Work: None ordered If you have labs (blood work) drawn today and your tests are completely normal, you will receive your results only by: MyChart Message (if you have MyChart) OR A paper copy in the mail If you have any lab test that is abnormal or we need to change your treatment, we will call you to review the results.   Testing/Procedures: None ordered   Follow-Up: At [facility], you and your health needs are our priority.  As part of our continuing mission to provide you with exceptional heart care, we have created designated Provider Care Teams.  These Care Teams include your primary Cardiologist (physician) and Advanced Practice Providers (APPs -  Physician Assistants and Nurse Practitioners) who all work together to provide you with the care you need, when you need it.  We recommend signing up for the patient portal called "MyChart".  Sign up information is provided on this After Visit Summary.  MyChart is used to connect with patients for Virtual Visits (Telemedicine).  Patients are able to view lab/test results, encounter notes, upcoming appointments, etc.  Non-urgent messages can be sent to your provider as well.   To learn more about what you can do with MyChart, go to [email or web address].    Your next appointment:   12 month(s)  The format for your next appointment:   In Person  Provider:   Huntley Dec, MD    Other Instructions none  Important Information About Sugar

## 2024-02-11 NOTE — Assessment & Plan Note (Signed)
 Last checked from October 07, 2023 total.  164, HDL 66, LDL 71, triglycerides 163. Appears optimal on current regimen with rosuvastatin 20 mg once daily and Repatha 140 mg subcutaneous every 14 days.

## 2024-02-11 NOTE — Assessment & Plan Note (Signed)
 She has been relatively steady with her weight. Unable to move much.  [facility] [employer] and suggested aquatic exercises that she can wait in the pool for 20 to 30 minutes.  She is in the process of working with her PCP on going on Wegovy for weight loss.  Encouraged her to continue with dietary calorie restriction and increase activity as tolerated.

## 2024-02-11 NOTE — Progress Notes (Signed)
 Cardiology Consultation:    Date:  02/11/2024   ID:  JEZLYN WESTERFIELD, DOB 09-Sep-1951, MRN [id number]  PCP:  Riley, Stephanie Coup, MD  Cardiologist:  Marlyn Corporal Korvin Valentine, MD   Referring MD: Riley, Stephanie Coup, *   No chief complaint on file.    ASSESSMENT AND PLAN:   Ms. Sherry Riley 73/F mild nonobstructive coronary artery disease from coronary CT imaging April 2023, diastolic dysfunction of the heart grade 1 on echocardiogram April 2021, trace aortic, mitral and tricuspid insufficiency, hyperlipidemia, asthma, CKD stage III, hypertension, prediabetes here for routine follow-up visit.  Problem List Items Addressed This Visit     Obesity, Class III, BMI 40-49.9 (morbid obesity) (HCC)   She has been relatively steady with her weight. Unable to move much.  [facility] plans to [facility] [employer] and suggested aquatic exercises that she can wait in the pool for 20 to 30 minutes.  She is in the process of working with her PCP on going on Wegovy for weight loss.  Encouraged her to continue with dietary calorie restriction and increase activity as tolerated.      Relevant Medications   Semaglutide,0.25 or 0.5MG /DOS, (OZEMPIC, 0.25 OR 0.5 MG/DOSE,) 2 MG/1.5ML SOPN   Chronic diastolic CHF (congestive heart failure) (HCC)   She does have clinically symptoms of mild ankle edema at times for which she uses furosemide as needed. Echocardiogram noted only grade 1 diastolic dysfunction. Symptoms could be more related to her obesity.  Appears to be at baseline.  Advised about weight monitoring at home daily. Weight goes up by 2 to 3 pounds within a day or 4 to 5 pounds within a week to take furosemide 20 mg.  She is diligent about salt and fluid restriction at home. Continue with blood pressure control.        Relevant Medications   rosuvastatin (CRESTOR) 20 MG tablet   Hyperlipidemia   Last checked from October 07, 2023 total.  164, HDL 66, LDL 71, triglycerides 163. Appears optimal on current regimen  with rosuvastatin 20 mg once daily and Repatha 140 mg subcutaneous every 14 days.        Relevant Medications   rosuvastatin (CRESTOR) 20 MG tablet   Hypertension - Primary   Well-controlled. Continue her current medication verapamil 180 mg once daily, clonidine 0.1 mg 2 times daily. Target blood pressure below 130/80 mmHg.       Relevant Medications   rosuvastatin (CRESTOR) 20 MG tablet   Other Relevant Orders   EKG 12-Lead (Completed)   CAD, mild nonobstructive disease CAD RADS 2 study with calcium score 252 April 2023   No cardiac symptoms of chest pain. Functional status is limited due to osteoarthritis of the knees.  Continue CAD management for plaque progression with dietary and lifestyle changes as possible and aggressive control of risk factors such as hyperlipidemia and prediabetes.  Continue aspirin 81 mg once daily, she is tolerating this well.       Relevant Medications   rosuvastatin (CRESTOR) 20 MG tablet   Return to clinic for follow-up with [place] tentatively in 1 year or as needed.   History of Present Illness:    Sherry Riley is a 73 y.o. female who is being seen today for follow-up visit. PCP is Riley, Sherry, *. Last visit with [place] in the office was 02-05-2023 with Dr. Dulce Riley.  History of nonobstructive coronary artery disease with coronary calcium score of 252 and less than 50% stenosis of LAD CAD RADS 2  study not significant by CT FFR April 2023, hypertension, prediabetes, hyperlipidemia, asthma, CKD stage III, obesity, prior echocardiogram from April 2021 showing low normal LVEF 50 to 55%, grade 1 diastolic dysfunction with trace aortic mitral and tricuspid insufficiency.  Pleasant woman here for the visit accompanied by her sister.  They live together.  Her sister helps with meal prep at home and they are mindful about salt intake.  She does not routinely check her weight at home.  Uses furosemide on an as-needed basis for any significant weight  gain or swelling in the ankles.  Uses furosemide about 2-3 times in the past week.  Overall mentions she has been doing well and at her baseline.  She is unable to walk significant distances due to bilateral knee osteoarthritis and uses a cane to ambulate in the house.  She typically does not walk outdoors due to concerns about uneven surfaces.  Weight has been an ongoing issue.  She did find some benefit with Ozempic samples provided by PCP and due to insurance cost coverage she is unable to continue with it.  She has pending follow-up with her PCP and is in the process to see if Sherry Riley would be an option.  Denies any chest pain, orthopnea or paroxysmal nocturnal dyspnea.  Denies any palpitations, syncopal episodes.  No significant asthma related symptoms and feels her asthma is well under good control  EKG in clinic today shows sinus rhythm heart rate 76/min, PR interval 190 ms, QRS duration 84 ms, QTc 427 ms.  Lipid panel from November 2024 reviewed LDL 21, HDL 66, total cholesterol 161 labs from July 2024 sodium 138, potassium 5.3, BUN 15, creatinine 1.22, EGFR 44  There were wondering if reevaluation of coronary artery disease with repeat cardiac CT is warranted.  Discussed given her mild nonobstructive disease in the past and no significant symptoms at this time this is not warranted.  Past Medical History:  Diagnosis Date   Abscess of lower back 06/02/2017   Acute diastolic CHF (congestive heart failure) (HCC) 02/10/2020   Acute respiratory failure with hypoxia (HCC) 02/10/2020   Allergic rhinitis 02/09/2020   Asthma    Atherosclerosis of aorta (HCC)    Chronic respiratory failure with hypoxia (HCC) 06/21/2023   Walk Test on room air 05/18/23- desat to 2L. Will start home O2 2l portable.     CKD (chronic kidney disease), symptom management only, stage 3 (moderate) (HCC)    Community acquired pneumonia of left lower lobe of lung 02/29/2020   Bronch 02/21/20- Dr Chestine Spore- for L mass>  Benign, lymphocytes, no malignancy   Complication of anesthesia    Hard to wake up.   Degenerative disc disease, lumbar 06/09/2013   Depression    Diastolic dysfunction    Left ventricle; noted on echo in 2021, EF low normal, LV dilated   Dysthymic disorder    Facet hypertrophy of lumbar region 06/09/2013   Fibromyalgia    Generalized hyperhidrosis    GERD (gastroesophageal reflux disease)    High blood pressure    High cholesterol    History of abscess of skin and subcutaneous tissue 08/13/2017   Hypertensive heart disease with chronic diastolic congestive heart failure (HCC)    Hypoxia 02/09/2020   Irritable bowel syndrome    Multiple lung nodules 07/01/2021   Bronch 02/21/20- Benign, lymphocytes, no malignancy CT chest 04/17/20- c/w benign   Obesity hypoventilation syndrome (HCC) 02/10/2020   Obesity, Class III, BMI 40-49.9 (morbid obesity) (HCC) 02/09/2020   OSA (obstructive  sleep apnea) 02/10/2020   HST 05/31/20- AHI 13.5/ hr, desaturation to 68% with mean 89%, body weight 285 lbs   Osteoarthritis 06/02/2017   Pain in right knee 08/06/2023   Pneumonia    Pre-diabetes    Thrush, oral 07/16/2022   Unilateral primary osteoarthritis, right knee 08/06/2023   Unspecified rotator cuff tear or rupture of left shoulder, not specified as traumatic 02/07/2020    Past Surgical History:  Procedure Laterality Date   ARTHROSCOPY KNEE W/ DRILLING Right    BIOPSY  02/21/2020   Procedure: BIOPSY;  Surgeon: Steffanie Dunn, DO;  Location: [facility];  Service: Cardiopulmonary;;   CYST EXCISION  2018   Back cyst   ENDOBRONCHIAL ULTRASOUND  02/21/2020   Procedure: ENDOBRONCHIAL ULTRASOUND;  Surgeon: Steffanie Dunn, DO;  Location: [facility];  Service: Cardiopulmonary;;   FINE NEEDLE ASPIRATION  02/21/2020   Procedure: FINE NEEDLE ASPIRATION (FNA) LINEAR;  Surgeon: Steffanie Dunn, DO;  Location: [facility];  Service: Cardiopulmonary;;   HERNIA REPAIR  2015   SHOULDER ARTHROSCOPY WITH  BANKART REPAIR Left 02/07/2020   Procedure: SHOULDER ARTHROSCOPY WITH BANKART REPAIR;  Surgeon: Sheral Apley, MD;  Location: [facility];  Service: Orthopedics;  Laterality: Left;   VIDEO BRONCHOSCOPY  02/21/2020   Procedure: VIDEO BRONCHOSCOPY WITH FLUORO;  Surgeon: Steffanie Dunn, DO;  Location: [facility];  Service: Cardiopulmonary;;    Current Medications: Current Meds  Medication Sig   albuterol (VENTOLIN HFA) 108 (90 Base) MCG/ACT inhaler Inhale 2 puffs every 6 hours as needed   aspirin EC 81 MG tablet Take 1 tablet (81 mg total) by mouth daily. Swallow whole.   azelastine (ASTELIN) 0.1 % nasal spray Place 2 sprays into both nostrils daily as needed for rhinitis. Use in each nostril as directed   Budeson-Glycopyrrol-Formoterol (BREZTRI AEROSPHERE) 160-9-4.8 MCG/ACT AERO Inhale 2 puffs then rinse mouth, twice daily   Calcium-Magnesium-Vitamin D (CALCIUM MAGNESIUM PO) Take 1,000 mg by mouth 4 (four) times daily.    Cinnamon 500 MG TABS Take 1,000 mg by mouth 4 (four) times daily.   cloNIDine (CATAPRES) 0.1 MG tablet Take 1 tablet (0.1 mg total) by mouth 2 (two) times daily.   Evolocumab (REPATHA SURECLICK) 140 MG/ML SOAJ Inject 140 mg into the skin every 14 (fourteen) days.   famotidine (PEPCID) 40 MG tablet Take 40 mg by mouth daily as needed for heartburn or indigestion.   furosemide (LASIX) 20 MG tablet Take 1 tablet (20 mg total) by mouth daily as needed for edema or fluid.   gabapentin (NEURONTIN) 400 MG capsule Take 400 mg by mouth 3 (three) times daily.   gabapentin (NEURONTIN) 600 MG tablet Take 600 mg by mouth 3 (three) times daily.   loratadine (CLARITIN) 10 MG tablet Take 10 mg by mouth daily.   LORazepam (ATIVAN) 0.5 MG tablet Take 0.5 mg by mouth 2 (two) times daily as needed for anxiety.   meclizine (ANTIVERT) 12.5 MG tablet Take 12.5 mg by mouth every 8 (eight) hours as needed for dizziness.   Misc Natural Products (OSTEO BI-FLEX JOINT SHIELD PO) Take 1 tablet by mouth in  the morning and at bedtime.    nitroGLYCERIN (NITROSTAT) 0.4 MG SL tablet PLACE 1 TABLET UNDER THE TONGUE EVERY 5 MINUTES AS NEEDED FOR CHEST PAIN.   omeprazole (PRILOSEC) 40 MG capsule Take 40 mg by mouth daily.   polyethylene glycol (MIRALAX / GLYCOLAX) 17 g packet Take 17 g by mouth daily.   rosuvastatin (CRESTOR) 20 MG tablet Take  20 mg by mouth daily.   Semaglutide,0.25 or 0.5MG /DOS, (OZEMPIC, 0.25 OR 0.5 MG/DOSE,) 2 MG/1.5ML SOPN Inject 0.25 mg into the skin once a week.   venlafaxine XR (EFFEXOR-XR) 75 MG 24 hr capsule Take 75 mg by mouth daily with breakfast.   verapamil (CALAN-SR) 180 MG CR tablet TAKE 1 TABLET BY MOUTH EVERY DAY     Allergies:   Atorvastatin, Lyrica [pregabalin], Actonel [risedronate sodium], Coreg [carvedilol], Hydralazine hcl, Pravastatin, Wellbutrin [bupropion], Ace inhibitors, Diltiazem hcl, Diovan hct [valsartan-hydrochlorothiazide], Doxycycline, and Valsartan   Social History   Socioeconomic History   Marital status: Single    Spouse name: Not on file   Number of children: Not on file   Years of education: Not on file   Highest education level: Not on file  Occupational History   Not on file  Tobacco Use   Smoking status: Former    Current packs/day: 0.00    Average packs/day: 1 pack/day for 37.0 years (37.0 ttl pk-yrs)    Types: Cigarettes    Start date: 47    Quit date: 2011    Years since quitting: 14.2    Passive exposure: Past   Smokeless tobacco: Never   Tobacco comments:    one-half to one pack per day  Vaping Use   Vaping status: Never Used  Substance and Sexual Activity   Alcohol use: No   Drug use: No   Sexual activity: Not on file  Other Topics Concern   Not on file  Social History Narrative   Not on file   Social Drivers of Health   Financial Resource Strain: Not on file  Food Insecurity: Low Risk  (10/07/2023)   Received from [facility]   Hunger Vital Sign    Worried About Running Out of Food in the Last Year: Never  true    Ran Out of Food in the Last Year: Never true  Transportation Needs: No Transportation Needs (10/07/2023)   Received from [facility]    In the past 12 months, has lack of reliable transportation kept you from medical appointments, meetings, work or from getting things needed for daily living? : No  Physical Activity: Not on file  Stress: Not on file  Social Connections: Not on file     Family History: The patient's family history includes Asthma in her mother; Diabetes in her brother and mother; Heart attack in her mother; Hypertension in her mother; Lung cancer in her father. ROS:   Please see the history of present illness.    All 14 point review of systems negative except as described per history of present illness.  EKGs/Labs/Other Studies Reviewed:    The following studies were reviewed today:   EKG:  EKG Interpretation Date/Time:  Thursday February 11 2024 13:21:02 EDT Ventricular Rate:  76 PR Interval:  190 QRS Duration:  84 QT Interval:  380 QTC Calculation: 427 R Axis:   -2  Text Interpretation: Normal sinus rhythm Low voltage QRS Cannot rule out Anterior infarct (cited on or before 21-Feb-2020) When compared with ECG of 21-Feb-2020 05:52, No significant change was found Confirmed by Huntley Dec reddy [id number]) on 02/11/2024 1:27:33 PM    Recent Labs: 05/18/2023: ALT 21; BUN 15; Creatinine, Ser 1.22; Hemoglobin 13.9; Platelets 227.0; Potassium 5.3; Pro B Natriuretic peptide (BNP) 42.0; Sodium 138  Recent Lipid Panel No results found for: "CHOL", "TRIG", "HDL", "CHOLHDL", "VLDL", "LDLCALC", "LDLDIRECT"  Physical Exam:    VS:  BP 132/76  Pulse 76   Ht 5\' 5"  (1.651 m)   Wt 296 lb (134.3 kg)   SpO2 98%   BMI 49.26 kg/m     Wt Readings from Last 3 Encounters:  02/11/24 296 lb (134.3 kg)  10/16/23 293 lb (132.9 kg)  08/17/23 292 lb (132.5 kg)     GENERAL:  Well nourished, well developed in no acute distress NECK: No JVD; No carotid  bruits CARDIAC: RRR, S1 and S2 present, no murmurs, no rubs, no gallops CHEST:  Clear to auscultation without rales, wheezing or rhonchi  Extremities: No pitting pedal edema. Pulses bilaterally symmetric with radial 2+ and dorsalis pedis 2+ NEUROLOGIC:  Alert and oriented x 3  Medication Adjustments/Labs and Tests Ordered: Current medicines are reviewed at length with the patient today.  Concerns regarding medicines are outlined above.  Orders Placed This Encounter  Procedures   EKG 12-Lead   No orders of the defined types were placed in this encounter.   Signed, Cecille Amsterdam, MD, MPH, [facility]. 02/11/2024 2:03 PM    [facility]

## 2024-02-11 NOTE — Assessment & Plan Note (Signed)
 No cardiac symptoms of chest pain. Functional status is limited due to osteoarthritis of the knees.  Continue CAD management for plaque progression with dietary and lifestyle changes as possible and aggressive control of risk factors such as hyperlipidemia and prediabetes.  Continue aspirin 81 mg once daily, she is tolerating this well.

## 2024-02-11 NOTE — Assessment & Plan Note (Signed)
 She does have clinically symptoms of mild ankle edema at times for which she uses furosemide as needed. Echocardiogram noted only grade 1 diastolic dysfunction. Symptoms could be more related to her obesity.  Appears to be at baseline.  Advised about weight monitoring at home daily. Weight goes up by 2 to 3 pounds within a day or 4 to 5 pounds within a week to take furosemide 20 mg.  She is diligent about salt and fluid restriction at home. Continue with blood pressure control.

## 2024-02-12 ENCOUNTER — Ambulatory Visit (HOSPITAL_BASED_OUTPATIENT_CLINIC_OR_DEPARTMENT_OTHER)
Admission: RE | Admit: 2024-02-12 | Discharge: 2024-02-12 | Disposition: A | Discharge status: Home / Self Care | Source: Ambulatory Visit | Attending: Family Medicine | Admitting: Family Medicine

## 2024-02-12 DIAGNOSIS — Z122 Encounter for screening for malignant neoplasm of respiratory organs: Secondary | ICD-10-CM

## 2024-02-12 DIAGNOSIS — Z87891 Personal history of nicotine dependence: Secondary | ICD-10-CM | POA: Diagnosis not present

## 2024-02-14 NOTE — Progress Notes (Unsigned)
 HPI  F former smoker followed for OSA, complicated by CHF, Allergic Rhinitis, Asthma, OHS, Morbid Obesity, Bronch 02/21/20- Dr Chestine Spore- for L mass> Benign, lymphocytes, no malignancy PFT 05/31/20- slight restriction of exhaled volume, Increased DLCO/VA, no resp to BD HST 05/31/20- AHI 13.5/ hr, desaturation to 68% with mean 89%, body weight 285 lbs CPAP  Auto 5-15/ [employer] new 08/08/20  Walk Test on room air 05/18/23- desat to 2L. Will start home O2 2l portable. ------------------------------------------------------------   08/17/23- 72 yoF former smoker(36 pkyrs) followed for OSA, Chronic Hypoxic Respiratory Failure, complicated by CHF, ASCVD, , Allergic Rhinitis, Asthma, OHS, Morbid Obesity, Hyperlipidemia, Hx AdenoCA L Lung,  -Breztri,  Flonase, Neb Atrovent 0.02%/ Albuterol, Ventolin hfa,  O2 2L/ [employer] CPAP auto 5-15/ [employer]              AirSense 11 AutoSet                 Download-compliance                    Body weight today-292 lbs CT chest low dose screening program- Cardiology following for ASCVD/CAD with peripheral edema. Will get flu vax at PCP. -----No issues at this time. Doing well! CXR 05/22/23- No active cardiopulmonary disease. No evidence of pneumonia or pulmonary edema.  02/15/24-  72 yoF former smoker(36 pkyrs) followed for OSA, Chronic Hypoxic Respiratory Failure, complicated by CHF, ASCVD, , Allergic Rhinitis, Asthma, OHS, Morbid Obesity, Hyperlipidemia, Hx AdenoCA L Lung,  -Breztri,  Flonase, Neb Atrovent 0.02%/ Albuterol, Ventolin hfa,  O2 2L/ [employer] CPAP auto 5-15/ [employer]              AirSense 11 AutoSet                 Download-compliance                    Body weight today- CT chest low dose screening program- Cardiology following for ASCVD/CAD with peripheral edema.     ROS-see HPI   + = positive Constitutional:    weight loss, night sweats, fevers, chills, fatigue, lassitude. HEENT:   + headaches, difficulty swallowing,+ tooth/dental problems, sore throat,        sneezing, itching, ear ache, nasal congestion, post nasal drip, snoring CV:    chest pain, orthopnea, PND, +swelling in lower extremities, anasarca,                                   dizziness, palpitations Resp:   +shortness of breath with exertion or at rest.                +productive  non-productive cough, coughing up of blood.              change in color of mucus.  wheezing.   Skin:    rash or lesions. GI:  +heartburn, indigestion, abdominal pain, nausea, vomiting, diarrhea,                 change in bowel habits, loss of appetite GU: dysuria, change in color of urine, no urgency or frequency.   flank pain. MS:   +joint pain, stiffness, decreased range of motion, back pain. Neuro-     nothing unusual Psych:  change in mood or affect.  depression or +anxiety.   memory loss.  OBJ- Physical Exam General- Alert, Oriented, Affect-appropriate, Distress- none acute, + morbidly obese Skin-  rash-none, lesions- none, excoriation- none Lymphadenopathy- none Head- atraumatic            Eyes- Gross vision intact, PERRLA, conjunctivae and secretions clear            Ears- Hearing, canals-normal            Nose- Clear, no-Septal dev, mucus+thrush, polyps, erosion, perforation             Throat- Mallampati II-III, mucosa clear , drainage- none, tonsils- atrophic Neck- flexible , trachea midline, no stridor , thyroid nl, carotid no bruit Chest - symmetrical excursion , unlabored           Heart/CV- RRR faint , no murmur , no gallop  , no rub, nl s1 s2                           - JVD- none , edema- none, stasis changes- none, varices- none           Lung- clear to P&A, wheeze- none, cough- none , dullness-none, rub- none           Chest wall-  Abd-  Br/ Gen/ Rectal- Not done, not indicated Extrem- cyanosis- none, clubbing, none, atrophy- none, strength- nl Neuro- grossly intact to observation

## 2024-02-15 ENCOUNTER — Ambulatory Visit: Payer: Medicare HMO | Admitting: Internal Medicine

## 2024-02-15 ENCOUNTER — Telehealth: Payer: Self-pay

## 2024-02-15 ENCOUNTER — Encounter: Payer: Self-pay | Admitting: Internal Medicine

## 2024-02-15 VITALS — BP 127/84 | HR 62 | Temp 97.7°F | Resp 18 | Ht 65.0 in | Wt 300.0 lb

## 2024-02-15 DIAGNOSIS — J9611 Chronic respiratory failure with hypoxia: Secondary | ICD-10-CM

## 2024-02-15 MED ORDER — IPRATROPIUM-ALBUTEROL 0.5-2.5 (3) MG/3ML IN SOLN: 3.0000 mL | Freq: Four times a day (QID) | RESPIRATORY_TRACT | 5 refills | Status: AC | PRN

## 2024-02-15 MED ORDER — ALBUTEROL SULFATE HFA 108 (90 BASE) MCG/ACT IN AERS: INHALATION_SPRAY | RESPIRATORY_TRACT | 12 refills | Status: DC

## 2024-02-15 MED ORDER — BREZTRI AEROSPHERE 160-9-4.8 MCG/ACT IN AERO: INHALATION_SPRAY | RESPIRATORY_TRACT | 12 refills | Status: DC

## 2024-02-15 NOTE — Patient Instructions (Signed)
 You should get notified soon about the results of your CT sca  Script sent for Duoneb (ipratropium with albuterol) nebulizer solution-  Try giving yourself a nebulizer treatment once or twice a day when you are having breathing troubles, in addition to your inhalers.  You are doing great with CPAP- keep up the good work  Ask your doctor to see if your insurance will cover Zepbound for the obesity with sleep apnea.

## 2024-02-15 NOTE — Telephone Encounter (Signed)
 Per Dr. Maple Hudson, please keep appointment for 02/15/2024.  Left message on VM.   Copied from CRM [id number]. Topic: Appointments - Scheduling Inquiry for Clinic >> Feb 12, 2024 12:08 PM Sherry Riley wrote: Reason for CRM: Patient has an appointment on Monday 04/07 with Dr. Maple Hudson - she just had a CT Chest Lung Cancer Screening  scan done today and was advised that it would be at least 10 days before the results would be provided.   Does Dr. Maple Hudson want her to still come in on Monday or should she reschedule the appointment? It appears that her last office visit was on 08/17/2023 - she was advised to return in 6 months - Patient followed for OSA and Chronic respiratory failure with hypoxia. Please call patient to confirm and/or provide additional guidance.

## 2024-02-15 NOTE — Telephone Encounter (Signed)
 Patient came in for OV today.  Will close encounter.

## 2024-02-17 ENCOUNTER — Other Ambulatory Visit (HOSPITAL_COMMUNITY): Payer: Self-pay

## 2024-02-17 DIAGNOSIS — I700 Atherosclerosis of aorta: Secondary | ICD-10-CM | POA: Diagnosis not present

## 2024-02-17 DIAGNOSIS — E785 Hyperlipidemia, unspecified: Secondary | ICD-10-CM | POA: Diagnosis not present

## 2024-02-17 DIAGNOSIS — I771 Stricture of artery: Secondary | ICD-10-CM | POA: Diagnosis not present

## 2024-02-17 DIAGNOSIS — I110 Hypertensive heart disease with heart failure: Secondary | ICD-10-CM | POA: Diagnosis not present

## 2024-02-17 DIAGNOSIS — I25119 Atherosclerotic heart disease of native coronary artery with unspecified angina pectoris: Secondary | ICD-10-CM | POA: Diagnosis not present

## 2024-02-17 DIAGNOSIS — G4733 Obstructive sleep apnea (adult) (pediatric): Secondary | ICD-10-CM | POA: Diagnosis not present

## 2024-02-17 DIAGNOSIS — E559 Vitamin D deficiency, unspecified: Secondary | ICD-10-CM | POA: Diagnosis not present

## 2024-02-17 DIAGNOSIS — G720 Drug-induced myopathy: Secondary | ICD-10-CM | POA: Diagnosis not present

## 2024-02-17 DIAGNOSIS — J9611 Chronic respiratory failure with hypoxia: Secondary | ICD-10-CM | POA: Diagnosis not present

## 2024-02-17 DIAGNOSIS — R7301 Impaired fasting glucose: Secondary | ICD-10-CM | POA: Diagnosis not present

## 2024-02-17 DIAGNOSIS — N1831 Chronic kidney disease, stage 3a: Secondary | ICD-10-CM | POA: Diagnosis not present

## 2024-02-17 DIAGNOSIS — T466X5A Adverse effect of antihyperlipidemic and antiarteriosclerotic drugs, initial encounter: Secondary | ICD-10-CM | POA: Diagnosis not present

## 2024-02-17 MED ORDER — WEGOVY 0.25 MG/0.5ML ~~LOC~~ SOAJ
0.2500 mg | SUBCUTANEOUS | 0 refills | Status: DC
  Filled 2024-02-17 – 2024-02-22 (×2): qty 2, 28d supply, fill #0

## 2024-02-22 ENCOUNTER — Other Ambulatory Visit (HOSPITAL_BASED_OUTPATIENT_CLINIC_OR_DEPARTMENT_OTHER): Payer: Self-pay

## 2024-02-22 ENCOUNTER — Other Ambulatory Visit (HOSPITAL_COMMUNITY): Payer: Self-pay

## 2024-02-24 ENCOUNTER — Other Ambulatory Visit (HOSPITAL_COMMUNITY): Payer: Self-pay

## 2024-02-25 ENCOUNTER — Encounter: Payer: Self-pay | Admitting: Internal Medicine

## 2024-03-16 ENCOUNTER — Other Ambulatory Visit: Payer: Self-pay | Admitting: Acute Care

## 2024-03-16 DIAGNOSIS — Z87891 Personal history of nicotine dependence: Secondary | ICD-10-CM

## 2024-03-16 DIAGNOSIS — Z122 Encounter for screening for malignant neoplasm of respiratory organs: Secondary | ICD-10-CM

## 2024-03-18 DIAGNOSIS — J9611 Chronic respiratory failure with hypoxia: Secondary | ICD-10-CM | POA: Diagnosis not present

## 2024-03-30 ENCOUNTER — Other Ambulatory Visit (HOSPITAL_COMMUNITY): Payer: Self-pay

## 2024-04-15 ENCOUNTER — Other Ambulatory Visit: Payer: Self-pay | Admitting: Cardiology

## 2024-04-18 DIAGNOSIS — J9611 Chronic respiratory failure with hypoxia: Secondary | ICD-10-CM | POA: Diagnosis not present

## 2024-05-12 ENCOUNTER — Other Ambulatory Visit (HOSPITAL_COMMUNITY): Payer: Self-pay

## 2024-05-18 DIAGNOSIS — J9611 Chronic respiratory failure with hypoxia: Secondary | ICD-10-CM | POA: Diagnosis not present

## 2024-05-26 DIAGNOSIS — M1711 Unilateral primary osteoarthritis, right knee: Secondary | ICD-10-CM | POA: Diagnosis not present

## 2024-06-02 DIAGNOSIS — M1711 Unilateral primary osteoarthritis, right knee: Secondary | ICD-10-CM | POA: Diagnosis not present

## 2024-06-09 DIAGNOSIS — M1711 Unilateral primary osteoarthritis, right knee: Secondary | ICD-10-CM | POA: Diagnosis not present

## 2024-06-18 DIAGNOSIS — J9611 Chronic respiratory failure with hypoxia: Secondary | ICD-10-CM | POA: Diagnosis not present

## 2024-06-23 ENCOUNTER — Telehealth (HOSPITAL_COMMUNITY): Payer: Self-pay

## 2024-06-23 ENCOUNTER — Other Ambulatory Visit (HOSPITAL_COMMUNITY): Payer: Self-pay

## 2024-06-24 ENCOUNTER — Telehealth: Payer: Self-pay | Admitting: Pharmacy Technician

## 2024-06-24 ENCOUNTER — Other Ambulatory Visit (HOSPITAL_BASED_OUTPATIENT_CLINIC_OR_DEPARTMENT_OTHER): Payer: Self-pay

## 2024-06-24 ENCOUNTER — Other Ambulatory Visit (HOSPITAL_COMMUNITY): Payer: Self-pay

## 2024-06-24 NOTE — Telephone Encounter (Signed)
 Pharmacy Patient Advocate Encounter   Received notification from Pt Calls Messages that prior authorization for repatha  is required/requested.   Insurance verification completed.   The patient is insured through [employer] .   Per test claim: PA required; PA submitted to above mentioned insurance via Latent Key/confirmation #/EOC [id number] Status is pending

## 2024-06-24 NOTE — Telephone Encounter (Signed)
 Pharmacy Patient Advocate Encounter  Received notification from [facility] that Prior Authorization for Repatha  has been APPROVED from 06/24/24 to 11/09/24. Ran test claim, Copay is $0.00- one month. This test claim was processed through [facility]- copay amounts may vary at other pharmacies due to pharmacy/plan contracts, or as the patient moves through the different stages of their insurance plan.   PA #/Case ID/Reference #: [id number]

## 2024-07-29 ENCOUNTER — Other Ambulatory Visit: Payer: Self-pay | Admitting: Internal Medicine

## 2024-07-29 ENCOUNTER — Other Ambulatory Visit (HOSPITAL_COMMUNITY): Payer: Self-pay

## 2024-07-29 MED ORDER — REPATHA SURECLICK 140 MG/ML ~~LOC~~ SOAJ
140.0000 mg | SUBCUTANEOUS | 3 refills | Status: AC
  Filled 2024-07-29: qty 6, 84d supply, fill #0
  Filled 2024-10-24: qty 6, 84d supply, fill #1

## 2024-08-18 DIAGNOSIS — J9611 Chronic respiratory failure with hypoxia: Secondary | ICD-10-CM | POA: Diagnosis not present

## 2024-08-23 NOTE — Progress Notes (Unsigned)
 Cardiology Office Note:    Date:  08/24/2024   ID:  Sherry Riley, DOB 1951/02/20, MRN [id number]  PCP:  Street, Lonni HERO, MD  Cardiologist:  Redell Leiter, MD    Referring MD: [address] Lonni HERO, *    ASSESSMENT:    1. Coronary artery disease involving native coronary artery of native heart without angina pectoris   2. Mixed hyperlipidemia   3. Elevated lipoprotein(a)   4. Mild CAD   5. Hypertensive heart disease with heart failure (HCC)    PLAN:    In order of problems listed above:  Stable CAD having no anginal discomfort [place]  Heart Association class I continue treatment including aspirin  lipid-lowering with PCSK9 inhibitor as needed nitroglycerin  More edematous short of breath increase the dose of her diuretic and recheck her echocardiogram Continue her current lipid-lowering therapy good result statin intolerant Pharm.D. referral for semaglutide  and obesity sleep apnea heart failure   Next appointment: 1 year   Medication Adjustments/Labs and Tests Ordered: Current medicines are reviewed at length with the patient today.  Concerns regarding medicines are outlined above.  Orders Placed This Encounter  Procedures   EKG 12-Lead   No orders of the defined types were placed in this encounter.    History of Present Illness:    Sherry Riley is a 73 y.o. female with a hx of coronary artery calcification on CT scan and a coronary calcium score of 52/86 percentile and mild nonobstructive CAD on CTA April 2023 hypertension hyperlipidemia requiring both a high intensity statin and Repatha  and lower extremity edema with calcium channel blocker last seen 02/05/2023.  Lipid profile 10/07/2023 cholesterol 164 LDL 71 HDL 66 non-HDL cholesterol 98 particles were high 876 LP(a) was elevated at 71  Compliance with diet, lifestyle and medications: Yes  Many issues she notices that she still has peripheral edema will increase the dose of her diuretic She wears a  smart watch I have reviewed the data heart rates are in range she has not captured any symptomatic events per oxygen  sats are down as low as 88% she is using CPAP plus limited oxygen  She is short of breath with activities but attributes much of this to her body weight she has morbid obesity with a BMI exceeding 50. She is frustrated she has been declined for GLP-1 agonist therapy I do not see a reason with both heart failure BMI exceeding 50 and sleep apnea and I will send a referral to Pharm.D. No chest pain orthopnea palpitation or syncope Or recent labs 02/17/2024 cholesterol 186 LDL 71 A1c 5.8 hemoglobin 86.4 creatinine 1.3 potassium 5.3 Past Medical History:  Diagnosis Date   Abscess of lower back 06/02/2017   Acute diastolic CHF (congestive heart failure) (HCC) 02/10/2020   Acute respiratory failure with hypoxia (HCC) 02/10/2020   Allergic rhinitis 02/09/2020   Asthma    Atherosclerosis of aorta    Chronic respiratory failure with hypoxia (HCC) 06/21/2023   Walk Test on room air 05/18/23- desat to 2L. Will start home O2 2l portable.     CKD (chronic kidney disease), symptom management only, stage 3 (moderate) (HCC)    Community acquired pneumonia of left lower lobe of lung 02/29/2020   Bronch 02/21/20- Dr Gretta- for L mass> Benign, lymphocytes, no malignancy   Complication of anesthesia    Hard to wake up.   Degenerative disc disease, lumbar 06/09/2013   Depression    Diastolic dysfunction    Left ventricle; noted on echo in 2021, EF  low normal, LV dilated   Dysthymic disorder    Facet hypertrophy of lumbar region 06/09/2013   Fibromyalgia    Generalized hyperhidrosis    GERD (gastroesophageal reflux disease)    High blood pressure    High cholesterol    History of abscess of skin and subcutaneous tissue 08/13/2017   Hypertensive heart disease with chronic diastolic congestive heart failure (HCC)    Hypoxia 02/09/2020   Irritable bowel syndrome    Multiple lung nodules  07/01/2021   Bronch 02/21/20- Benign, lymphocytes, no malignancy CT chest 04/17/20- c/w benign   Obesity hypoventilation syndrome (HCC) 02/10/2020   Obesity, Class III, BMI 40-49.9 (morbid obesity) (HCC) 02/09/2020   OSA (obstructive sleep apnea) 02/10/2020   HST 05/31/20- AHI 13.5/ hr, desaturation to 68% with mean 89%, body weight 285 lbs   Osteoarthritis 06/02/2017   Pain in right knee 08/06/2023   Pneumonia    Pre-diabetes    Thrush, oral 07/16/2022   Unilateral primary osteoarthritis, right knee 08/06/2023   Unspecified rotator cuff tear or rupture of left shoulder, not specified as traumatic 02/07/2020    Current Medications: Current Meds  Medication Sig   albuterol  (VENTOLIN  HFA) 108 (90 Base) MCG/ACT inhaler Inhale 2 puffs every 6 hours as needed   aspirin  EC 81 MG tablet Take 1 tablet (81 mg total) by mouth daily. Swallow whole.   azelastine (ASTELIN) 0.1 % nasal spray Place 2 sprays into both nostrils daily as needed for rhinitis. Use in each nostril as directed   budeson-glycopyrrolate-formoterol  (BREZTRI  AEROSPHERE) 160-9-4.8 MCG/ACT AERO Inhale 2 puffs then rinse mouth, twice daily   Calcium-Magnesium-Vitamin D (CALCIUM MAGNESIUM PO) Take 1,000 mg by mouth 4 (four) times daily.    Cinnamon 500 MG TABS Take 1,000 mg by mouth 4 (four) times daily.   cloNIDine  (CATAPRES ) 0.1 MG tablet Take 1 tablet (0.1 mg total) by mouth 2 (two) times daily.   Evolocumab  (REPATHA  SURECLICK) 140 MG/ML SOAJ Inject 140 mg into the skin every 14 (fourteen) days.   fluticasone  (FLONASE ) 50 MCG/ACT nasal spray Place 1 spray into both nostrils daily.   furosemide  (LASIX ) 20 MG tablet Take 1 tablet (20 mg total) by mouth daily as needed for edema or fluid.   gabapentin  (NEURONTIN ) 400 MG capsule Take 400 mg by mouth 3 (three) times daily.   gabapentin  (NEURONTIN ) 600 MG tablet Take 600 mg by mouth 3 (three) times daily.   ipratropium-albuterol  (DUONEB) 0.5-2.5 (3) MG/3ML SOLN Take 3 mLs by nebulization  every 6 (six) hours as needed.   loratadine  (CLARITIN ) 10 MG tablet Take 10 mg by mouth daily.   LORazepam  (ATIVAN ) 0.5 MG tablet Take 0.5 mg by mouth 2 (two) times daily as needed for anxiety.   meclizine  (ANTIVERT ) 12.5 MG tablet Take 12.5 mg by mouth every 8 (eight) hours as needed for dizziness.   Misc Natural Products (OSTEO BI-FLEX JOINT SHIELD PO) Take 1 tablet by mouth in the morning and at bedtime.    nitroGLYCERIN  (NITROSTAT ) 0.4 MG SL tablet PLACE 1 TABLET UNDER THE TONGUE EVERY 5 MINUTES AS NEEDED FOR CHEST PAIN.   omeprazole (PRILOSEC) 40 MG capsule Take 40 mg by mouth daily.   polyethylene glycol (MIRALAX  / GLYCOLAX ) 17 g packet Take 17 g by mouth daily.   venlafaxine  XR (EFFEXOR -XR) 75 MG 24 hr capsule Take 75 mg by mouth daily with breakfast.   verapamil  (CALAN -SR) 180 MG CR tablet Take 1 tablet by mouth once daily   [DISCONTINUED] Semaglutide ,0.25 or 0.5MG /DOS, (OZEMPIC , 0.25  OR 0.5 MG/DOSE,) 2 MG/1.5ML SOPN Inject 0.25 mg into the skin once a week.      EKGs/Labs/Other Studies Reviewed:    The following studies were reviewed today:  Cardiac Studies & Procedures   ______________________________________________________________________________________________     ECHOCARDIOGRAM  ECHOCARDIOGRAM COMPLETE 02/10/2020  Narrative ECHOCARDIOGRAM REPORT    Patient Name:   MOLLY DELENA KAY Date of Exam: 02/10/2020 Medical Rec #:  [id number]       Height:       65.0 in Accession #:    [id number]      Weight:       286.1 lb Date of Birth:  26-Sep-1951        BSA:          2.303 m Patient Age:    73 years        BP:           145/84 mmHg Patient Gender: F               HR:           70 bpm. Exam Location:  Inpatient  Procedure: 2D Echo  Indications:    Acute respiratory failure with hypoxia (HCC) [327266]  History:        Patient has no prior history of Echocardiogram examinations. Risk Factors:Hypertension, Dyslipidemia, Prediabetes and Former Smoker. Left rotator cuff  tear.  Sonographer:    Annabella Fell RDCS Referring Phys: [id number] CURTIS J WOODS   Sonographer Comments: Technically challenging study due to left rotator cuff tear. IMPRESSIONS   1. Technically difficult; normal LV systolic function; mild LVE; grade 1 diastolic dysfunction; trace AI. 2. Left ventricular ejection fraction, by estimation, is 50 to 55%. The left ventricle has low normal function. The left ventricle has no regional wall motion abnormalities. The left ventricular internal cavity size was mildly dilated. Left ventricular diastolic parameters are consistent with Grade I diastolic dysfunction (impaired relaxation). 3. Right ventricular systolic function is normal. The right ventricular size is normal. Tricuspid regurgitation signal is inadequate for assessing PA pressure. 4. The mitral valve is normal in structure. Trivial mitral valve regurgitation. No evidence of mitral stenosis. 5. The aortic valve is normal in structure. Aortic valve regurgitation is trivial. No aortic stenosis is present. 6. The inferior vena cava is dilated in size with <50% respiratory variability, suggesting right atrial pressure of 15 mmHg.  FINDINGS Left Ventricle: Left ventricular ejection fraction, by estimation, is 50 to 55%. The left ventricle has low normal function. The left ventricle has no regional wall motion abnormalities. The left ventricular internal cavity size was mildly dilated. There is no left ventricular hypertrophy. Left ventricular diastolic parameters are consistent with Grade I diastolic dysfunction (impaired relaxation).  Right Ventricle: The right ventricular size is normal.Right ventricular systolic function is normal. Tricuspid regurgitation signal is inadequate for assessing PA pressure. The tricuspid regurgitant velocity is 1.60 m/s, and with an assumed right atrial pressure of 15 mmHg, the estimated right ventricular systolic pressure is 25.2 mmHg.  Left Atrium: Left atrial  size was normal in size.  Right Atrium: Right atrial size was normal in size.  Pericardium: There is no evidence of pericardial effusion.  Mitral Valve: The mitral valve is normal in structure. Normal mobility of the mitral valve leaflets. Mild mitral annular calcification. Trivial mitral valve regurgitation. No evidence of mitral valve stenosis.  Tricuspid Valve: The tricuspid valve is normal in structure. Tricuspid valve regurgitation is trivial. No evidence of tricuspid stenosis.  Aortic Valve: The  aortic valve is normal in structure. Aortic valve regurgitation is trivial. No aortic stenosis is present.  Pulmonic Valve: The pulmonic valve was not well visualized. Pulmonic valve regurgitation is not visualized. No evidence of pulmonic stenosis.  Aorta: The aortic root is normal in size and structure.  Venous: The inferior vena cava is dilated in size with less than 50% respiratory variability, suggesting right atrial pressure of 15 mmHg.  Additional Comments: Technically difficult; normal LV systolic function; mild LVE; grade 1 diastolic dysfunction; trace AI.   LEFT VENTRICLE PLAX 2D LVIDd:         5.30 cm  Diastology LVIDs:         3.60 cm  LV e' lateral:   0.06 cm/s LV PW:         0.70 cm  LV E/e' lateral: 12.0 LV IVS:        0.90 cm  LV e' medial:    0.06 cm/s LVOT diam:     2.00 cm  LV E/e' medial:  12.5 LV SV:         68 LV SV Index:   30 LVOT Area:     3.14 cm   RIGHT VENTRICLE RV S prime:     9.03 cm/s TAPSE (M-mode): 1.7 cm  LEFT ATRIUM             Index LA diam:        3.60 cm 1.56 cm/m LA Vol (A2C):   38.1 ml 16.54 ml/m LA Vol (A4C):   33.8 ml 14.68 ml/m LA Biplane Vol: 35.8 ml 15.54 ml/m AORTIC VALVE LVOT Vmax:   90.80 cm/s LVOT Vmean:  60.400 cm/s LVOT VTI:    0.218 m  AORTA Ao Root diam: 2.70 cm  MITRAL VALVE               TRICUSPID VALVE MV Area (PHT): 4.49 cm    TR Peak grad:   10.2 mmHg MV Decel Time: 169 msec    TR Vmax:        160.00  cm/s MV E velocity: 0.72 cm/s MV A velocity: 93.00 cm/s  SHUNTS MV E/A ratio:  0.01        Systemic VTI:  0.22 m Systemic Diam: 2.00 cm  Redell Shallow MD Electronically signed by Redell Shallow MD Signature Date/Time: 02/10/2020/1:54:11 PM    Final      CT SCANS  CT CORONARY FRACTIONAL FLOW RESERVE DATA PREP 02/13/2022  Narrative EXAM: FFRCT ANALYSIS  FINDINGS: FFRct analysis was performed on the original cardiac CT angiogram dataset. Diagrammatic representation of the FFRct analysis is provided in a separate PDF document in PACS. This dictation was created using the PDF document and an interactive 3D model of the results. 3D model is not available in the EMR/PACS. Normal FFR range is >0.80.  1. LM FFR: 0.99 2. LAD FFR: Prox 0.99, mid 0.96, distal 0.92 3. CX FFR: Prox 0.99, mid 0.99, distal 0.97 4. RCA FFR: Prox 0.99, mid 0.97, distal 0.98. 5.  IMPRESSION: No evidence of hemodynamically significant stenosis.  D1 branch too small to be evaluated.   Electronically Signed By: Lamar Fitch M.D. On: 02/13/2022 18:06   CT SCANS  CT CORONARY MORPH W/CTA COR W/SCORE 02/13/2022  Addendum 02/13/2022  6:01 PM ADDENDUM REPORT: 02/13/2022 17:59  EXAM: Cardiac/Coronary  CTA  TECHNIQUE: The patient was scanned on a [employer].  FINDINGS: A 120 kV prospective scan was triggered in the descending thoracic aorta at 111 HU's. Axial  non-contrast 3 mm slices were carried out through the heart. The data set was analyzed on a dedicated work station and scored using the Agatson method. Gantry rotation speed was 250 msecs and collimation was .6 mm. No beta blockade and 0.8 mg of sl NTG was given. The 3D data set was reconstructed in 5% intervals of the 67-82 % of the R-R cycle. Diastolic phases were analyzed on a dedicated work station using MPR, MIP and VRT modes. The patient received 80 cc of contrast.  Aorta:  Normal size.  No calcifications.  No  dissection.  Aortic Valve:  Trileaflet.  Mild calcifications.  Coronary Arteries:  Normal coronary origin.  Right dominance.  RCA is a large dominant artery that gives rise to PDA and PLA. There are small calcified plaques with irregularities of this artery with minimal stenosis of 0-25%.  Left main is a large artery that gives rise to LAD and LCX arteries.  LAD is a large vessel. Proximal portion has multiple mixed plaques with mild stenosis of 25-49%. This portion of the LAD has motion artefacts and is suboptimally visualized. This artery gives rise to moderate size D1 which is poorly visualized.  LCX is a non-dominant artery that gives rise to one large OM1 branch. There is no plaque.  Other findings:  Normal pulmonary vein drainage into the left atrium.  Normal left atrial appendage without a thrombus.  Normal size of the pulmonary artery.  IMPRESSION: 1. Coronary calcium score of 252. This was 57 percentile for age and sex matched control.  2. Normal coronary origin with right dominance.  3. CAD-RADS 2. Mild non-obstructive CAD (25-49%). Consider non-atherosclerotic causes of chest pain. Consider preventive therapy and risk factor modification.  4. Proximal portion of LAD poorly visualized, study will be submitted for FFR to r/o hemodynamically significant stenosis in this artery.  5.  Can not r/o significant stenosis of moderate size D1  6. TDS   Electronically Signed By: Lamar Fitch M.D. On: 02/13/2022 17:59  Narrative EXAM: OVER-READ INTERPRETATION  CT CHEST  The following report is an over-read performed by radiologist Dr. Reyes Holder of [facility] on 02/13/2022. This over-read does not include interpretation of cardiac or coronary anatomy or pathology. The coronary calcium score/coronary CTA interpretation by the cardiologist is attached.  COMPARISON:  Chest CT October 23, 2021  FINDINGS: Vascular: Aortic atherosclerosis.  No acute noncardiac vascular finding.  Mediastinum/Nodes: No pathologically enlarged mediastinal or hilar lymph nodes within the acquired field-of-view. Visualized portions of the esophagus appear normal.  Lungs/Pleura: Hypoventilatory change in the dependent lungs. Lingular scarring versus atelectasis. No suspicious pulmonary nodules or masses and no pleural effusion or pneumothorax within the acquired field-of-view.  Upper Abdomen: No acute abnormality.  Musculoskeletal: No acute osseous abnormality.  IMPRESSION: 1. No significant extracardiac findings are identified. 2. Aortic Atherosclerosis (ICD10-I70.0).  Electronically Signed: By: Reyes Holder M.D. On: 02/13/2022 11:24     ______________________________________________________________________________________________      EKG Interpretation Date/Time:  Wednesday August 24 2024 13:20:08 EDT Ventricular Rate:  74 PR Interval:  208 QRS Duration:  90 QT Interval:  416 QTC Calculation: 461 R Axis:   -5  Text Interpretation: Normal sinus rhythm Low voltage QRS When compared with ECG of 11-Feb-2024 13:21, No significant change was found Confirmed by Monetta Rogue ([id number]) on 08/24/2024 1:28:33 PM   Recent Labs: No results found for requested labs within last 365 days.  Recent Lipid Panel No results found for: CHOL, TRIG, HDL, CHOLHDL, VLDL, LDLCALC,  LDLDIRECT  Physical Exam:    VS:  BP 100/70   Pulse 74   Ht 5' 5 (1.651 m)   Wt (!) 308 lb 3.2 oz (139.8 kg)   SpO2 93%   BMI 51.29 kg/m     Wt Readings from Last 3 Encounters:  08/24/24 (!) 308 lb 3.2 oz (139.8 kg)  02/15/24 300 lb (136.1 kg)  02/11/24 296 lb (134.3 kg)     GEN: Morbidly obese well nourished, well developed in no acute distress HEENT: Normal NECK: No JVD; No carotid bruits LYMPHATICS: No lymphadenopathy CARDIAC: RRR, no murmurs, rubs, gallops RESPIRATORY:  Clear to auscultation without rales, wheezing or rhonchi  ABDOMEN:  Soft, non-tender, non-distended MUSCULOSKELETAL: She has bilateral lower extremity pitting edema; No deformity  SKIN: Warm and dry NEUROLOGIC:  Alert and oriented x 3 PSYCHIATRIC:  Normal affect    Signed, Redell Leiter, MD  08/24/2024 1:49 PM    [facility]

## 2024-08-24 ENCOUNTER — Ambulatory Visit: Attending: Cardiology | Admitting: Cardiology

## 2024-08-24 ENCOUNTER — Encounter: Payer: Self-pay | Admitting: Cardiology

## 2024-08-24 VITALS — BP 100/70 | HR 74 | Ht 65.0 in | Wt 308.2 lb

## 2024-08-24 DIAGNOSIS — E7841 Elevated Lipoprotein(a): Secondary | ICD-10-CM | POA: Diagnosis not present

## 2024-08-24 DIAGNOSIS — E782 Mixed hyperlipidemia: Secondary | ICD-10-CM | POA: Diagnosis not present

## 2024-08-24 DIAGNOSIS — I110 Hypertensive heart disease with heart failure: Secondary | ICD-10-CM | POA: Diagnosis not present

## 2024-08-24 DIAGNOSIS — I2510 Atherosclerotic heart disease of native coronary artery without angina pectoris: Secondary | ICD-10-CM

## 2024-08-24 MED ORDER — FUROSEMIDE 40 MG PO TABS: 40.0000 mg | ORAL_TABLET | Freq: Every day | ORAL | 3 refills | Status: AC

## 2024-08-24 NOTE — Patient Instructions (Signed)
 Medication Instructions:  Your physician has recommended you make the following change in your medication:   START: Furosemide  40 mg daily  *If you need a refill on your cardiac medications before your next appointment, please call your pharmacy*  Lab Work: None If you have labs (blood work) drawn today and your tests are completely normal, you will receive your results only by: MyChart Message (if you have MyChart) OR A paper copy in the mail If you have any lab test that is abnormal or we need to change your treatment, we will call you to review the results.  Testing/Procedures: Your physician has requested that you have an echocardiogram. Echocardiography is a painless test that uses sound waves to create images of your heart. It provides your doctor with information about the size and shape of your heart and how well your heart's chambers and valves are working. This procedure takes approximately one hour. There are no restrictions for this procedure. Please do NOT wear cologne, perfume, aftershave, or lotions (deodorant is allowed). Please arrive 15 minutes prior to your appointment time.  Please note: We ask at that you not bring children with you during ultrasound (echo/ vascular) testing. Due to room size and safety concerns, children are not allowed in the ultrasound rooms during exams. Our front office staff cannot provide observation of children in our lobby area while testing is being conducted. An adult accompanying a patient to their appointment will only be allowed in the ultrasound room at the discretion of the ultrasound technician under special circumstances. We apologize for any inconvenience.   Follow-Up: At [facility], you and your health needs are our priority.  As part of our continuing mission to provide you with exceptional heart care, our providers are all part of one team.  This team includes your primary Cardiologist (physician) and Advanced Practice  Providers or APPs (Physician Assistants and Nurse Practitioners) who all work together to provide you with the care you need, when you need it.  Your next appointment:   1 year(s)  Provider:   Redell Leiter, MD    We recommend signing up for the patient portal called MyChart.  Sign up information is provided on this After Visit Summary.  MyChart is used to connect with patients for Virtual Visits (Telemedicine).  Patients are able to view lab/test results, encounter notes, upcoming appointments, etc.  Non-urgent messages can be sent to your provider as well.   To learn more about what you can do with MyChart, go to [email or web address].   Other Instructions None

## 2024-09-02 ENCOUNTER — Other Ambulatory Visit: Payer: Self-pay | Admitting: *Deleted

## 2024-09-02 DIAGNOSIS — E785 Hyperlipidemia, unspecified: Secondary | ICD-10-CM

## 2024-09-13 ENCOUNTER — Ambulatory Visit: Payer: Self-pay | Admitting: Cardiology

## 2024-09-13 ENCOUNTER — Ambulatory Visit: Attending: Cardiology

## 2024-09-13 DIAGNOSIS — I110 Hypertensive heart disease with heart failure: Secondary | ICD-10-CM

## 2024-09-13 DIAGNOSIS — E782 Mixed hyperlipidemia: Secondary | ICD-10-CM

## 2024-09-13 DIAGNOSIS — E7841 Elevated Lipoprotein(a): Secondary | ICD-10-CM

## 2024-09-13 DIAGNOSIS — I2510 Atherosclerotic heart disease of native coronary artery without angina pectoris: Secondary | ICD-10-CM

## 2024-09-13 LAB — ECHOCARDIOGRAM COMPLETE
AR max vel: 3.04 cm2
AV Area VTI: 3.09 cm2
AV Area mean vel: 3.07 cm2
AV Mean grad: 4.7 mmHg
AV Peak grad: 9 mmHg
Ao pk vel: 1.5 m/s
Area-P 1/2: 3.21 cm2
MV VTI: 2.15 cm2
S' Lateral: 4.3 cm

## 2024-09-23 ENCOUNTER — Encounter: Payer: Self-pay | Admitting: Pharmacist Clinician (PhC)/ Clinical Pharmacy Specialist

## 2024-09-23 ENCOUNTER — Ambulatory Visit: Attending: Cardiology | Admitting: Pharmacist Clinician (PhC)/ Clinical Pharmacy Specialist

## 2024-09-23 ENCOUNTER — Telehealth: Payer: Self-pay | Admitting: Pharmacist Clinician (PhC)/ Clinical Pharmacy Specialist

## 2024-09-23 VITALS — Ht 64.0 in | Wt 305.2 lb

## 2024-09-23 DIAGNOSIS — E66813 Obesity, class 3: Secondary | ICD-10-CM | POA: Diagnosis not present

## 2024-09-23 DIAGNOSIS — Z131 Encounter for screening for diabetes mellitus: Secondary | ICD-10-CM

## 2024-09-23 NOTE — Patient Instructions (Signed)
 We will start the prior authorization process to get Zepbound covered by your insurance.   TIPS FOR SUCCESS Write down the reasons why you want to lose weight and post it in a place where you'll see it often. Start small and work your way up. Keep in mind that it takes time to achieve goals, and small steps add up. Any additional movements help to burn calories. Taking the stairs rather than the elevator and parking at the far end of your parking lot are easy ways to start. Brisk walking for at least 30 minutes 4 or more days of the week is an excellent goal to work toward  [employer] WHAT IT MEANS TO FEEL FULL Did you know that it can take 15 minutes or more for your brain to receive the message that you've eaten? That means that, if you eat less food, but consume it slower, you may still feel satisfied. Eating a lot of fruits and vegetables can also help you feel fuller. Eat off of smaller plates so that moderate portions don't seem too small  TITRATION PLAN Will plan to follow the titration plan pending approval and patient tolerating each dose before increasing to the next. Can slow titration if needed for tolerability.    If you have any questions or concerns, please reach out to me via MyChart  THANK YOU FOR CHOOSING [facility]

## 2024-09-23 NOTE — Progress Notes (Addendum)
 Office Visit    Patient Name: Sherry Riley Date of Encounter: 09/23/2024  Primary Care Provider:  Street, Lonni HERO, MD Primary Cardiologist:  None  Chief Complaint    Weight management  Significant Past Medical History   OSA On CPAP  AHI 13.5  CHF On furosemide  40 mg daily  CAD CAC = 52 (86th percentile)  HTN Controlled on verapamil , clonidine   HLD 11/24 LDL 71 on Repatha   preDM 4/25 A1c 5.8    Allergies  Allergen Reactions   Pregabalin Rash and Dermatitis   Atorvastatin Other (See Comments)    Myalgia   Actonel [Risedronate Sodium]    Coreg [Carvedilol] Hives   Diltiazem Dermatitis   Hydralazine Other (See Comments)    hydralazine hydrochloride   Hydralazine Hcl Other (See Comments) and Cough    Chest pain, dyspnea   Pravastatin      Other reaction(s): Other (see comments)   Wellbutrin [Bupropion] Tinitus   Ace Inhibitors Rash   Diltiazem Hcl Itching and Rash   Doxycycline Rash and Dermatitis   Valsartan Rash and Dermatitis   Valsartan-Hydrochlorothiazide Rash and Dermatitis    History of Present Illness    Sherry Riley is a 73 y.o. female patient of Dr Joice, in the office today to discuss options for weight management.    Baseline weight/BMI: 138.4 kg // 52.36  Insurance payor:  [employer] Dual Complete  Diet: 2 meals, late breakfast, supper; breakfast is cereal now, some bacon and eggs; snacking is nuts, sweet potato chips in avocado oil, roasted vegetabes  Exercise: limited, but is able to walk 5-10 minutes  Social History:   Tobacco: no - quit 2011  Alcohol: no  Accessory Clinical Findings    Lab Results  Component Value Date   CREATININE 1.22 (H) 05/18/2023   BUN 15 05/18/2023   NA 138 05/18/2023   K 5.3 (H) 05/18/2023   CL 99 05/18/2023   CO2 31 05/18/2023   Lab Results  Component Value Date   ALT 21 05/18/2023   AST 20 05/18/2023   ALKPHOS 88 05/18/2023   BILITOT 0.6 05/18/2023   No results found for:  HGBA1C    Home Medications/Allergies    Current Outpatient Medications  Medication Sig Dispense Refill   albuterol  (VENTOLIN  HFA) 108 (90 Base) MCG/ACT inhaler Inhale 2 puffs every 6 hours as needed 18 each 12   aspirin  EC 81 MG tablet Take 1 tablet (81 mg total) by mouth daily. Swallow whole. 90 tablet 3   azelastine (ASTELIN) 0.1 % nasal spray Place 2 sprays into both nostrils daily as needed for rhinitis. Use in each nostril as directed     budeson-glycopyrrolate-formoterol  (BREZTRI  AEROSPHERE) 160-9-4.8 MCG/ACT AERO Inhale 2 puffs then rinse mouth, twice daily 10.7 g 12   Calcium-Magnesium-Vitamin D (CALCIUM MAGNESIUM PO) Take 1,000 mg by mouth 4 (four) times daily.      Cinnamon 500 MG TABS Take 1,000 mg by mouth 4 (four) times daily.     cloNIDine  (CATAPRES ) 0.1 MG tablet Take 1 tablet (0.1 mg total) by mouth 2 (two) times daily. 60 tablet 0   Evolocumab  (REPATHA  SURECLICK) 140 MG/ML SOAJ Inject 140 mg into the skin every 14 (fourteen) days. 6 mL 3   fluticasone  (FLONASE ) 50 MCG/ACT nasal spray Place 1 spray into both nostrils daily. 15 mL 0   furosemide  (LASIX ) 40 MG tablet Take 1 tablet (40 mg total) by mouth daily. 90 tablet 3   gabapentin  (NEURONTIN ) 400 MG capsule Take  400 mg by mouth 3 (three) times daily.     gabapentin  (NEURONTIN ) 600 MG tablet Take 600 mg by mouth 3 (three) times daily.     ipratropium-albuterol  (DUONEB) 0.5-2.5 (3) MG/3ML SOLN Take 3 mLs by nebulization every 6 (six) hours as needed. 360 mL 5   loratadine  (CLARITIN ) 10 MG tablet Take 10 mg by mouth daily.     LORazepam  (ATIVAN ) 0.5 MG tablet Take 0.5 mg by mouth 2 (two) times daily as needed for anxiety.  2   meclizine  (ANTIVERT ) 12.5 MG tablet Take 12.5 mg by mouth every 8 (eight) hours as needed for dizziness.  1   Misc Natural Products (OSTEO BI-FLEX JOINT SHIELD PO) Take 1 tablet by mouth in the morning and at bedtime.      nitroGLYCERIN  (NITROSTAT ) 0.4 MG SL tablet PLACE 1 TABLET UNDER THE TONGUE EVERY  5 MINUTES AS NEEDED FOR CHEST PAIN. 25 tablet 2   omeprazole (PRILOSEC) 40 MG capsule Take 40 mg by mouth daily.     polyethylene glycol (MIRALAX  / GLYCOLAX ) 17 g packet Take 17 g by mouth daily.     venlafaxine  XR (EFFEXOR -XR) 75 MG 24 hr capsule Take 75 mg by mouth daily with breakfast.     verapamil  (CALAN -SR) 180 MG CR tablet Take 1 tablet by mouth once daily 90 tablet 3   No current facility-administered medications for this visit.     Allergies  Allergen Reactions   Pregabalin Rash and Dermatitis   Atorvastatin Other (See Comments)    Myalgia   Actonel [Risedronate Sodium]    Coreg [Carvedilol] Hives   Diltiazem Dermatitis   Hydralazine Other (See Comments)    hydralazine hydrochloride   Hydralazine Hcl Other (See Comments) and Cough    Chest pain, dyspnea   Pravastatin      Other reaction(s): Other (see comments)   Wellbutrin [Bupropion] Tinitus   Ace Inhibitors Rash   Diltiazem Hcl Itching and Rash   Doxycycline Rash and Dermatitis   Valsartan Rash and Dermatitis   Valsartan-Hydrochlorothiazide Rash and Dermatitis    Assessment & Plan    Obesity, Class III, BMI 40-49.9 (morbid obesity) (HCC) Patient does not meet current Medicare criteria, as OSA AHI is 13.5 (insurance requires to be > 15, = moderate to severe)  Will repeat A1c today.  Has been as high as 6 in the past, 5.8 earlier this year.   Patient has not met goal of at least 5% of body weight loss with comprehensive lifestyle modifications alone in the past 3-6 months. Pharmacotherapy is appropriate to pursue as augmentation. Will start Zepbound.   Advised patient on common side effects including nausea, diarrhea, dyspepsia, decreased appetite, and fatigue. Counseled patient on reducing meal size and how to titrate medication to minimize side effects. Patient aware to call if intolerable side effects or if experiencing dehydration, abdominal pain, or dizziness. Patient will adhere to dietary modifications and will  target at least 150 minutes of moderate intensity exercise weekly, plus resistance training twice a week (as recommended by the American Heart Association). This resistance training--such as weightlifting, bodyweight exercises, or using resistance bands, adapted to the patient's ability--will help prevent muscle loss.  Injection technique reviewed at today's visit.    Follow up in 1-2 days regarding coverage of Zepbound . If therapy is initiated, phone or MyChart follow-ups will be conducted every 4 weeks for dose titration until the patient reaches the effective therapeutic dose and target weight.    Simeon Vera PharmD CPP [facility]  [address] [place], [place] [address] [phone]

## 2024-09-23 NOTE — Assessment & Plan Note (Addendum)
 Patient does not meet current Medicare criteria, as OSA AHI is 13.5 (insurance requires to be > 15, = moderate to severe)  Will repeat A1c today.  Has been as high as 6 in the past, 5.8 earlier this year.   Patient has not met goal of at least 5% of body weight loss with comprehensive lifestyle modifications alone in the past 3-6 months. Pharmacotherapy is appropriate to pursue as augmentation. Will start Zepbound.   Advised patient on common side effects including nausea, diarrhea, dyspepsia, decreased appetite, and fatigue. Counseled patient on reducing meal size and how to titrate medication to minimize side effects. Patient aware to call if intolerable side effects or if experiencing dehydration, abdominal pain, or dizziness. Patient will adhere to dietary modifications and will target at least 150 minutes of moderate intensity exercise weekly, plus resistance training twice a week (as recommended by the American Heart Association). This resistance training--such as weightlifting, bodyweight exercises, or using resistance bands, adapted to the patient's ability--will help prevent muscle loss.  Injection technique reviewed at today's visit.    Follow up in 1-2 days regarding coverage of Zepbound . If therapy is initiated, phone or MyChart follow-ups will be conducted every 4 weeks for dose titration until the patient reaches the effective therapeutic dose and target weight.

## 2024-09-24 LAB — HEMOGLOBIN A1C
Est. average glucose Bld gHb Est-mCnc: 120 mg/dL
Hgb A1c MFr Bld: 5.8 % — ABNORMAL HIGH (ref 4.8–5.6)

## 2024-09-27 ENCOUNTER — Telehealth: Payer: Self-pay | Admitting: Pharmacy Technician

## 2024-09-27 ENCOUNTER — Other Ambulatory Visit (HOSPITAL_COMMUNITY): Payer: Self-pay

## 2024-09-27 NOTE — Telephone Encounter (Signed)
 Would you please try for PA for Zepbound?

## 2024-09-27 NOTE — Telephone Encounter (Signed)
 Insurance requests chart notes to be submitted :see below:  Has the provider submitted medical records (for example, chart notes) confirming diagnosis of moderate to severe obstructive sleep apnea (for example, 15 or more obstructive respiratory events [apnea-hypopnea index (AHI)] per hour of sleep confirmed by a sleep study)? Failure to submit medical records may lead to an adverse determination.

## 2024-09-28 ENCOUNTER — Other Ambulatory Visit (HOSPITAL_COMMUNITY): Payer: Self-pay

## 2024-09-28 NOTE — Telephone Encounter (Signed)
 Pharmacy Patient Advocate Encounter   Received notification from Physician's Office that prior authorization for zepbound is required/requested.   Insurance verification completed.   The patient is insured through [employer].   Per test claim: PA required; PA submitted to above mentioned insurance via Latent Key/confirmation #/EOC [id number] Status is pending

## 2024-09-28 NOTE — Telephone Encounter (Signed)
 Her AHI was 13.1.  Go ahead and submit the sleep study, I'm sure we'll get the rejection.  Because the patient also will get rejection letter, makes it easier for me to tell them why.

## 2024-09-29 NOTE — Telephone Encounter (Signed)
 Pharmacy Patient Advocate Encounter  Received notification from [facility] that Prior Authorization for zepbound has been DENIED.  Full denial letter will be uploaded to the media tab. See denial reason below.   PA #/Case ID/Reference #: [id number]

## 2024-10-03 ENCOUNTER — Ambulatory Visit: Admitting: Primary Care

## 2024-10-03 ENCOUNTER — Encounter: Payer: Self-pay | Admitting: Primary Care

## 2024-10-03 VITALS — BP 124/66 | HR 71 | Temp 97.3°F | Ht 64.0 in | Wt 307.0 lb

## 2024-10-03 DIAGNOSIS — G4733 Obstructive sleep apnea (adult) (pediatric): Secondary | ICD-10-CM

## 2024-10-03 DIAGNOSIS — J45909 Unspecified asthma, uncomplicated: Secondary | ICD-10-CM

## 2024-10-03 DIAGNOSIS — Z23 Encounter for immunization: Secondary | ICD-10-CM

## 2024-10-03 DIAGNOSIS — J9611 Chronic respiratory failure with hypoxia: Secondary | ICD-10-CM

## 2024-10-03 MED ORDER — ALBUTEROL SULFATE HFA 108 (90 BASE) MCG/ACT IN AERS: INHALATION_SPRAY | RESPIRATORY_TRACT | 12 refills | Status: AC

## 2024-10-03 MED ORDER — BREZTRI AEROSPHERE 160-9-4.8 MCG/ACT IN AERO: INHALATION_SPRAY | RESPIRATORY_TRACT | 12 refills | Status: AC

## 2024-10-03 NOTE — Patient Instructions (Addendum)
  VISIT SUMMARY: You had a follow-up visit to discuss your respiratory conditions, including sleep apnea, chronic respiratory failure, and asthma. You reported improved breathing and good compliance with your CPAP therapy. We also reviewed your recent weight gain and lung cancer screening results.  YOUR PLAN: -OBSTRUCTIVE SLEEP APNEA WITH OBESITY: Obstructive sleep apnea is a condition where your breathing stops and starts during sleep due to blocked airways, often worsened by obesity. Your condition is well-controlled with CPAP, but weight gain may have worsened it. We ordered a home sleep study to reassess the severity and discussed potential weight loss medications like Zepbound or Wegovy , which may be covered by insurance in April. Continue using your CPAP therapy and check with your insurance provider about coverage for weight loss medications.  -CHRONIC RESPIRATORY FAILURE WITH HYPOXIA: Chronic respiratory failure with hypoxia means your lungs are not getting enough oxygen  into your blood. This is managed with nocturnal oxygen  therapy at 2 liters through your CPAP. Continue this therapy as it has improved your breathing.  -ASTHMA: Asthma is a condition where your airways narrow and swell, making it hard to breathe. Your asthma is well-managed with Breztri  and occasional use of albuterol . Continue taking Breztri  daily and use albuterol  as needed.  -HISTORY OF ADENOCARCINOMA OF LEFT LUNG: Adenocarcinoma is a type of lung cancer. Your recent CT scan showed benign lung nodules with no new growths. Continue following up with the lung cancer screening program, and your next CT scan is scheduled for May 2026.  INSTRUCTIONS: Please complete the home sleep study to reassess your sleep apnea severity. Continue using your CPAP therapy and nocturnal oxygen  therapy as prescribed. Discuss insurance coverage for weight loss medications with your provider. Follow up with the lung cancer screening program and  schedule your next CT scan for May 2026.  Orders: Ok for Pneumonia and flu vaccine  In-lab sleep study re: OSA   Follow-up: 6 months with [employer]

## 2024-10-03 NOTE — Progress Notes (Signed)
 @Patient  ID: Sherry Riley, female    DOB: June 02, 1951, 73 y.o.   MRN: [id number]  No chief complaint on file.   Referring provider: Street, Lonni HERO, *  HPI: 73 year female, former smoker. PMH CHF, HTN, CAD, CHF, allergic rhinitis, asthma, chronic respiratory failure, OSA, multiple lung nodules, GRED, CKD, pre-diabetes. obesity.   Previous [facility] pulmonary encounter:  02/15/24-  72 yoF former smoker(36 pkyrs) followed for OSA, Chronic Hypoxic Respiratory Failure, complicated by CHF, ASCVD, , Allergic Rhinitis, Asthma, OHS, Morbid Obesity, Hyperlipidemia, Hx AdenoCA L Lung,  -Breztri ,  Flonase , Neb , Ventolin  hfa,  O2 2L/ [employer] sleep and prn portable CPAP auto 5-15/ [employer]              AirSense 11 AutoSet                 Download-compliance        100%, AHI 4.5/hr            Body weight today-300 lbs CT chest low dose screening program- Report from 4/4 pending Cardiology following for ASCVD/CAD with peripheral edema. Download reviewed. She is doing very well with CPAP and will continue. Noting DOE with some wheeze during pollen season. We will order Duoneb neb solution for her nebulizer. Discussed the use of AI scribe software for clinical note transcription with the patient, who gave verbal consent to proceed. History of Present Illness   The patient, with a history of sleep apnea managed with CPAP, presents with shortness of breath, especially during exertion. She reports that her oxygen  levels drop during the day, even with minimal activity such as walking around the house, unless she wears her POC.SABRA She has noticed some wheezing but denies coughing. She uses Breztri  and has a nebulizer machine at home, although she does not use it regularly. She also has portable oxygen  POC  set at two, which she uses during the day. She has noticed no changes with her CPAP and is satisfied with its current settings. She is also awaiting the results of a recent low-dose screening CT scan.      10/03/2024- Interim hx Discussed the use of AI scribe software for clinical note transcription with the patient, who gave verbal consent to proceed.  History of Present Illness Sherry Riley is a 73 year old female with sleep apnea and chronic respiratory failure who presents for a follow-up visit. She was referred by Dr. Neysa for follow-up on her respiratory conditions.  She uses Breztri  daily for COPD and albuterol  as needed, though she has not needed it much recently. She reports doing better with her breathing and uses oxygen  therapy at night with her CPAP machine. She uses two liters of oxygen  at night with her CPAP and as needed.  Her CPAP compliance report from October 22nd to November 20th shows 97% usage, averaging 7 hours and 57 minutes per night. The CPAP is set on auto settings with a minimum pressure of 5 and a maximum of 15. Her apnea-hypopnea index (AHI) is 4.7, reduced from 13.5 events per hour noted in her original sleep study in July 2021.  She has gained approximately 20 pounds since her original sleep study, with her weight at that time being 285 pounds. She is trying to work on her weight.  She follows with the lung cancer screening program and had a CT scan in May 2025. She was told the scan showed benign lung nodules and aortic atherosclerosis. She also follows with cardiology and  reports that her heart doctor said everything looked okay regarding her cholesterol levels.  She has a history of adenocarcinoma of the left lung, diagnosed previously, and underwent a bronchoscopy in April 2021, which was benign.  No frequent use of albuterol , reports improved breathing with oxygen  therapy at night.   Pulmonary testing Bronch 02/21/20- Dr Gretta- for L mass> Benign, lymphocytes, no malignancy  PFT 05/31/20- slight restriction of exhaled volume, Increased DLCO/VA, no resp to BD   Sleep testing HST 05/31/20- AHI 13.5/ hr, desaturation to 68% with mean 89%, body weight  285 lbs   Imaging: LDCT May 2025 >> Lung RADS 2, benign appearance. Pulmonary nodules measuring up to 6.39mm or less in size. No new pulmonary nodules. Aortic atheroscolerosis      Allergies  Allergen Reactions   Pregabalin Rash and Dermatitis   Atorvastatin Other (See Comments)    Myalgia   Actonel [Risedronate Sodium]    Coreg [Carvedilol] Hives   Diltiazem Dermatitis   Hydralazine Other (See Comments)    hydralazine hydrochloride   Hydralazine Hcl Other (See Comments) and Cough    Chest pain, dyspnea   Pravastatin      Other reaction(s): Other (see comments)   Wellbutrin [Bupropion] Tinitus   Ace Inhibitors Rash   Diltiazem Hcl Itching and Rash   Doxycycline Rash and Dermatitis   Valsartan Rash and Dermatitis   Valsartan-Hydrochlorothiazide Rash and Dermatitis    Immunization History  Administered Date(s) Administered   Fluad Quad(high Dose 65+) 10/08/2020   INFLUENZA, HIGH DOSE SEASONAL PF 08/11/2019   Moderna SARS-COV2 Booster Vaccination 10/25/2020   Moderna Sars-Covid-2 Vaccination 01/12/2020, 02/23/2020    Past Medical History:  Diagnosis Date   Abscess of lower back 06/02/2017   Acute diastolic CHF (congestive heart failure) (HCC) 02/10/2020   Acute respiratory failure with hypoxia (HCC) 02/10/2020   Allergic rhinitis 02/09/2020   Asthma    Atherosclerosis of aorta    Chronic respiratory failure with hypoxia (HCC) 06/21/2023   Walk Test on room air 05/18/23- desat to 2L. Will start home O2 2l portable.     CKD (chronic kidney disease), symptom management only, stage 3 (moderate) (HCC)    Community acquired pneumonia of left lower lobe of lung 02/29/2020   Bronch 02/21/20- Dr Gretta- for L mass> Benign, lymphocytes, no malignancy   Complication of anesthesia    Hard to wake up.   Degenerative disc disease, lumbar 06/09/2013   Depression    Diastolic dysfunction    Left ventricle; noted on echo in 2021, EF low normal, LV dilated   Dysthymic disorder    Facet  hypertrophy of lumbar region 06/09/2013   Fibromyalgia    Generalized hyperhidrosis    GERD (gastroesophageal reflux disease)    High blood pressure    High cholesterol    History of abscess of skin and subcutaneous tissue 08/13/2017   Hypertensive heart disease with chronic diastolic congestive heart failure (HCC)    Hypoxia 02/09/2020   Irritable bowel syndrome    Multiple lung nodules 07/01/2021   Bronch 02/21/20- Benign, lymphocytes, no malignancy CT chest 04/17/20- c/w benign   Obesity hypoventilation syndrome (HCC) 02/10/2020   Obesity, Class III, BMI 40-49.9 (morbid obesity) (HCC) 02/09/2020   OSA (obstructive sleep apnea) 02/10/2020   HST 05/31/20- AHI 13.5/ hr, desaturation to 68% with mean 89%, body weight 285 lbs   Osteoarthritis 06/02/2017   Pain in right knee 08/06/2023   Pneumonia    Pre-diabetes    Thrush, oral 07/16/2022   Unilateral  primary osteoarthritis, right knee 08/06/2023   Unspecified rotator cuff tear or rupture of left shoulder, not specified as traumatic 02/07/2020    Tobacco History: Social History   Tobacco Use  Smoking Status Former   Current packs/day: 0.00   Average packs/day: 1 pack/day for 37.0 years (37.0 ttl pk-yrs)   Types: Cigarettes   Start date: 74   Quit date: 2011   Years since quitting: 14.9   Passive exposure: Past  Smokeless Tobacco Never  Tobacco Comments   one-half to one pack per day   Counseling given: Not Answered Tobacco comments: one-half to one pack per day   Outpatient Medications Prior to Visit  Medication Sig Dispense Refill   albuterol  (VENTOLIN  HFA) 108 (90 Base) MCG/ACT inhaler Inhale 2 puffs every 6 hours as needed 18 each 12   aspirin  EC 81 MG tablet Take 1 tablet (81 mg total) by mouth daily. Swallow whole. 90 tablet 3   azelastine (ASTELIN) 0.1 % nasal spray Place 2 sprays into both nostrils daily as needed for rhinitis. Use in each nostril as directed     budeson-glycopyrrolate-formoterol  (BREZTRI   AEROSPHERE) 160-9-4.8 MCG/ACT AERO Inhale 2 puffs then rinse mouth, twice daily 10.7 g 12   Calcium-Magnesium-Vitamin D (CALCIUM MAGNESIUM PO) Take 1,000 mg by mouth 4 (four) times daily.      Cinnamon 500 MG TABS Take 1,000 mg by mouth 4 (four) times daily.     cloNIDine  (CATAPRES ) 0.1 MG tablet Take 1 tablet (0.1 mg total) by mouth 2 (two) times daily. 60 tablet 0   Evolocumab  (REPATHA  SURECLICK) 140 MG/ML SOAJ Inject 140 mg into the skin every 14 (fourteen) days. 6 mL 3   fluticasone  (FLONASE ) 50 MCG/ACT nasal spray Place 1 spray into both nostrils daily. 15 mL 0   furosemide  (LASIX ) 40 MG tablet Take 1 tablet (40 mg total) by mouth daily. 90 tablet 3   gabapentin  (NEURONTIN ) 400 MG capsule Take 400 mg by mouth 3 (three) times daily.     gabapentin  (NEURONTIN ) 600 MG tablet Take 600 mg by mouth 3 (three) times daily.     ipratropium-albuterol  (DUONEB) 0.5-2.5 (3) MG/3ML SOLN Take 3 mLs by nebulization every 6 (six) hours as needed. 360 mL 5   loratadine  (CLARITIN ) 10 MG tablet Take 10 mg by mouth daily.     LORazepam  (ATIVAN ) 0.5 MG tablet Take 0.5 mg by mouth 2 (two) times daily as needed for anxiety.  2   meclizine  (ANTIVERT ) 12.5 MG tablet Take 12.5 mg by mouth every 8 (eight) hours as needed for dizziness.  1   Misc Natural Products (OSTEO BI-FLEX JOINT SHIELD PO) Take 1 tablet by mouth in the morning and at bedtime.      nitroGLYCERIN  (NITROSTAT ) 0.4 MG SL tablet PLACE 1 TABLET UNDER THE TONGUE EVERY 5 MINUTES AS NEEDED FOR CHEST PAIN. 25 tablet 2   omeprazole (PRILOSEC) 40 MG capsule Take 40 mg by mouth daily.     polyethylene glycol (MIRALAX  / GLYCOLAX ) 17 g packet Take 17 g by mouth daily.     venlafaxine  XR (EFFEXOR -XR) 75 MG 24 hr capsule Take 75 mg by mouth daily with breakfast.     verapamil  (CALAN -SR) 180 MG CR tablet Take 1 tablet by mouth once daily 90 tablet 3   No facility-administered medications prior to visit.    Review of Systems  Review of Systems  Constitutional:  Negative.   Respiratory: Negative.      Physical Exam  There were no vitals taken  for this visit. Physical Exam Constitutional:      General: She is not in acute distress.    Appearance: Normal appearance. She is well-developed. She is obese. She is not ill-appearing.  HENT:     Head: Normocephalic and atraumatic.     Mouth/Throat:     Mouth: Mucous membranes are moist.     Pharynx: Oropharynx is clear.  Eyes:     Pupils: Pupils are equal, round, and reactive to light.  Cardiovascular:     Rate and Rhythm: Normal rate and regular rhythm.     Heart sounds: Normal heart sounds. No murmur heard. Pulmonary:     Effort: Pulmonary effort is normal. No respiratory distress.     Breath sounds: Normal breath sounds. No wheezing or rhonchi.  Musculoskeletal:        General: Normal range of motion.     Cervical back: Normal range of motion and neck supple.  Skin:    General: Skin is warm and dry.     Findings: No erythema or rash.  Neurological:     General: No focal deficit present.     Mental Status: She is alert and oriented to person, place, and time. Mental status is at baseline.  Psychiatric:        Mood and Affect: Mood normal.        Behavior: Behavior normal.        Thought Content: Thought content normal.        Judgment: Judgment normal.     Lab Results:  CBC    Component Value Date/Time   WBC 11.3 (H) 05/18/2023 1632   RBC 4.89 05/18/2023 1632   HGB 13.9 05/18/2023 1632   HGB 14.5 01/07/2018 1116   HCT 43.9 05/18/2023 1632   HCT 42.2 01/07/2018 1116   PLT 227.0 05/18/2023 1632   PLT 267 01/07/2018 1116   MCV 89.6 05/18/2023 1632   MCV 89 01/07/2018 1116   MCH 30.5 02/11/2020 0326   MCHC 31.7 05/18/2023 1632   RDW 15.6 (H) 05/18/2023 1632   RDW 14.2 01/07/2018 1116   LYMPHSABS 2.7 05/18/2023 1632   LYMPHSABS 3.9 (H) 01/07/2018 1116   MONOABS 1.1 (H) 05/18/2023 1632   EOSABS 0.3 05/18/2023 1632   EOSABS 0.1 01/07/2018 1116   BASOSABS 0.1 05/18/2023 1632    BASOSABS 0.1 01/07/2018 1116    BMET    Component Value Date/Time   NA 138 05/18/2023 1632   NA 140 02/12/2022 0935   K 5.3 (H) 05/18/2023 1632   CL 99 05/18/2023 1632   CO2 31 05/18/2023 1632   GLUCOSE 93 05/18/2023 1632   BUN 15 05/18/2023 1632   BUN 25 02/12/2022 0935   CREATININE 1.22 (H) 05/18/2023 1632   CALCIUM 9.8 05/18/2023 1632   GFRNONAA 59 (L) 02/11/2020 0326   GFRAA >60 02/11/2020 0326    BNP    Component Value Date/Time   BNP 120.1 (H) 02/09/2020 0255    ProBNP    Component Value Date/Time   PROBNP 42.0 05/18/2023 1632    Imaging: ECHOCARDIOGRAM COMPLETE Result Date: 09/13/2024    ECHOCARDIOGRAM REPORT   Patient Name:   Sherry Riley Date of Exam: 09/13/2024 Medical Rec #:  [id number]       Height:       65.0 in Accession #:    [id number]      Weight:       308.2 lb Date of Birth:  June 06, 1951  BSA:          2.377 m Patient Age:    73 years        BP:           100/70 mmHg Patient Gender: F               HR:           74 bpm. Exam Location:  [facility] Procedure: 2D Echo, Cardiac Doppler, Color Doppler and Strain Analysis (Both            Spectral and Color Flow Doppler were utilized during procedure). 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 CHF, CAD; Risk Factors:Sleep Apnea. Prior echo-                 50-55% EF, LV mildly dilated.  Sonographer:    Saddie Chimes Referring Phys: [id number] BRIAN J MUNLEY  Sonographer Comments: Technically difficult study due to poor echo windows. IMPRESSIONS  1.  TDS. Left ventricular ejection fraction, by estimation, is 60 to 65%. The left ventricle has normal function. The left ventricle has no regional wall motion abnormalities. There is mild left ventricular hypertrophy. Left ventricular diastolic parameters are consistent with Grade I diastolic dysfunction (impaired relaxation). The average left ventricular global longitudinal strain is 19.4 %. The global longitudinal strain is normal.  2. Right ventricular systolic function is normal. The right ventricular size is normal.  3. There is no evidence of cardiac tamponade.  4. The mitral valve is normal in structure. No evidence of mitral valve regurgitation. No evidence of mitral stenosis.  5. The aortic valve is normal in structure. Aortic valve regurgitation is not visualized. No aortic stenosis is present.  6. The inferior vena cava is normal in size with greater than 50% respiratory variability, suggesting right atrial pressure of 3 mmHg. FINDINGS  Left Ventricle: TDS. Left ventricular ejection fraction, by estimation, is 60 to 65%. The left ventricle has normal function. The left ventricle has no regional wall motion abnormalities. The average left ventricular global longitudinal strain is 19.4 %. Strain was performed and the global longitudinal strain is normal. The left ventricular internal cavity size was normal in size. There is mild left ventricular hypertrophy. Left ventricular diastolic parameters are consistent with Grade I diastolic dysfunction (impaired relaxation). Right Ventricle: The right ventricular size is normal. No increase in right ventricular wall thickness. Right ventricular systolic function is normal. Left Atrium: Left atrial size was normal in size. Right Atrium: Right atrial size was normal in size. Pericardium: Trivial pericardial effusion is present. There is no evidence of cardiac tamponade. Mitral Valve: The mitral valve is normal  in structure. No evidence of mitral valve regurgitation. No  evidence of mitral valve stenosis. MV peak gradient, 6.5 mmHg. The mean mitral valve gradient is 2.0 mmHg. Tricuspid Valve: The tricuspid valve is normal in structure. Tricuspid valve regurgitation is not demonstrated. No evidence of tricuspid stenosis. Aortic Valve: The aortic valve is normal in structure. Aortic valve regurgitation is not visualized. No aortic stenosis is present. Aortic valve mean gradient measures 4.7 mmHg. Aortic valve peak gradient measures 9.0 mmHg. Aortic valve area, by VTI measures 3.09 cm. Pulmonic Valve: The pulmonic valve was normal in structure. Pulmonic valve regurgitation is not visualized. No evidence of pulmonic stenosis. Aorta: The aortic root is normal in size and structure. Venous: The inferior vena cava is normal in size with greater than 50% respiratory variability, suggesting right atrial pressure of 3 mmHg. IAS/Shunts: No atrial level shunt detected by color flow Doppler.  LEFT VENTRICLE PLAX 2D LVIDd:         6.00 cm   Diastology LVIDs:         4.30 cm   LV e' medial:    6.31 cm/s LV PW:         1.30 cm   LV E/e' medial:  11.9 LV IVS:        1.50 cm   LV e' lateral:   9.14 cm/s LVOT diam:     2.30 cm   LV E/e' lateral: 8.2 LV SV:         96 LV SV Index:   40        2D Longitudinal Strain LVOT Area:     4.15 cm  2D Strain GLS Avg:     19.4 %  RIGHT VENTRICLE RV Basal diam:  4.50 cm RV Mid diam:    3.50 cm TAPSE (M-mode): 2.1 cm LEFT ATRIUM           Index LA diam:      4.10 cm 1.72 cm/m LA Vol (A4C): 71.2 ml 29.95 ml/m  AORTIC VALVE                     PULMONIC VALVE AV Area (Vmax):    3.04 cm      PV Vmax:       1.00 m/s AV Area (Vmean):   3.07 cm      PV Vmean:      56.700 cm/s AV Area (VTI):     3.09 cm      PV VTI:        0.201 m AV Vmax:           150.33 cm/s   PV Peak grad:  4.0 mmHg AV Vmean:          100.233 cm/s  PV Mean grad:  2.0 mmHg AV VTI:            0.310 m AV Peak Grad:      9.0 mmHg AV Mean Grad:      4.7 mmHg LVOT Vmax:         110.00 cm/s LVOT Vmean:         74.150 cm/s LVOT VTI:          0.231 m LVOT/AV VTI ratio: 0.74  AORTA Ao Asc diam: 3.50 cm MITRAL VALVE MV Area (PHT): 3.21 cm     SHUNTS MV Area VTI:   2.15 cm     Systemic VTI:  0.23 m MV Peak grad:  6.5 mmHg  Systemic Diam: 2.30 cm MV Mean grad:  2.0 mmHg MV Vmax:       1.27 m/s MV Vmean:      69.4 cm/s MV Decel Time: 236 msec MV E velocity: 74.90 cm/s MV A velocity: 126.00 cm/s MV E/A ratio:  0.59 Lamar Fitch MD Electronically signed by Lamar Fitch MD Signature Date/Time: 09/13/2024/7:20:39 PM    Final      Assessment & Plan:   1. OSA (obstructive sleep apnea) (Primary)  2. Asthma, unspecified asthma severity, unspecified whether complicated, unspecified whether persistent  3. Chronic respiratory failure with hypoxia (HCC)  Assessment and Plan Assessment & Plan Obstructive sleep apnea with obesity Mild obstructive sleep apnea is well-controlled with CPAP. Auto settings 5-15cm h20 reducing apnea events from 13.5 to 4.7 per hour. Weight gain of approximately 20 pounds since the last sleep study in 2021 may have worsened the condition. Discussed potential qualification for GLP-1 receptor agonists like Zepbound or Wegovy  if sleep apnea is moderate to severe. Insurance coverage for weight loss medications may improve in April. - Ordered in-lab polysomnography to reassess sleep apnea severity due to weight gain. - Continue CPAP therapy auto settings 5-15cm h20 nightly 4-6 hours. - Discuss insurance coverage for weight loss medications with insurance provider. - Will consider GLP-1 receptor agonists if sleep apnea is moderate to severe or insurance coverage changes for weight management in Spring 2026.  Chronic respiratory failure with hypoxia Managed with nocturnal oxygen  therapy at 2 liters blended through CPAP. Breathing has improved with current management. - Continue nocturnal oxygen  therapy at 2 liters blended through CPAP.  Asthma Managed with Breztri  and albuterol   as needed. Albuterol  use has been minimal, indicating good control. - Continue Breztri  two puffs twice daily. - Use albuterol  2 puffs every 4-6 hours for breakthrough shortness of breath/wheezing   History of adenocarcinoma of left lung Following with lung cancer screening program. Largest nodule measured 6.9 mm or less. No new pulmonary nodules detected. Previous bronchoscopy in 2021 showed benign findings. - Continue follow-up with lung cancer screening program. - Will schedule next CT scan in May 2026.   Almarie LELON Ferrari, NP 10/03/2024

## 2024-10-12 NOTE — Progress Notes (Unsigned)
 HPI  F former smoker followed for OSA, complicated by CHF, Allergic Rhinitis, Asthma, OHS, Morbid Obesity, Bronch 02/21/20- Dr Gretta- for L mass> Benign, lymphocytes, no malignancy PFT 05/31/20- slight restriction of exhaled volume, Increased DLCO/VA, no resp to BD HST 05/31/20- AHI 13.5/ hr, desaturation to 68% with mean 89%, body weight 285 lbs CPAP  Auto 5-15/ [employer] new 08/08/20  Walk Test on room air 05/18/23- desat to 2L. Will start home O2 2l portable. ------------------------------------------------------------  02/15/24-  73 yoF former smoker(36 pkyrs) followed for OSA, Chronic Hypoxic Respiratory Failure, complicated by CHF, ASCVD, , Allergic Rhinitis, Asthma, OHS, Morbid Obesity, Hyperlipidemia, Hx AdenoCA L Lung,  -Breztri ,  Flonase , Neb , Ventolin  hfa,  O2 2L/ [employer] sleep and prn portable CPAP auto 5-15/ [employer]              AirSense 11 AutoSet                 Download-compliance        100%, AHI 4.5/hr            Body weight today-300 lbs CT chest low dose screening program- Report from 4/4 pending Cardiology following for ASCVD/CAD with peripheral edema. Download reviewed. She is doing very well with CPAP and will continue. Noting DOE with some wheeze during pollen season. We will order Duoneb neb solution for her nebulizer. Discussed the use of AI scribe software for clinical note transcription with the patient, who gave verbal consent to proceed. History of Present Illness   The patient, with a history of sleep apnea managed with CPAP, presents with shortness of breath, especially during exertion. She reports that her oxygen  levels drop during the day, even with minimal activity such as walking around the house, unless she wears her POC.SABRA She has noticed some wheezing but denies coughing. She uses Breztri  and has a nebulizer machine at home, although she does not use it regularly. She also has portable oxygen  POC  set at two, which she uses during the day. She has noticed no changes with  her CPAP and is satisfied with its current settings. She is also awaiting the results of a recent low-dose screening CT scan.  History of Present Illness  Assessment and Plan:    Chronicc Respiratory failure with hypoxia Daytime dyspnea with oxygen  desaturation managed with nighttime oxygen  therapy. Portable oxygen  at 2 L/min, adjustable to 3 L/min. Occasional wheezing managed with inhalers. Nebulizer medication simplified to single ampule. - Refill nebulizer medication with single ampule Duoneb. - Advise nebulizer use once or twice daily when symptomatic. - Continue portable oxygen , increase to 3 L/min if significant dyspnea occurs.  Sleep Apnea CPAP therapy effective with less than five apneas per hour. No issues with CPAP use. Current pressure settings appropriate. - Maintain current CPAP settings.  Obesity Weight management ongoing to alleviate dyspnea. Semaglutide  treatment considered, insurance coverage problematic. Zepbound may be an option for sleep apnea with obesity. - Discuss semaglutide  options with primary care provider, including Zepbound for sleep apnea with obesity.  Medication Management Requires refills for albuterol  and Breztri  inhalers. Pharmacy updated to [facility] [address] in [place]. - Refill albuterol  and Breztri  inhalers. - Update pharmacy to [facility] on [address] in [place] [employer].  Follow-up Scheduled for follow-up in one year. Awaiting results of low-dose screening CT scan, delayed due to regional backlogs. - Schedule follow-up in one year. - Await results of low-dose screening CT scan.     Script for Duoneb neb solution.   10/13/24- 73  yoF former smoker(36 pkyrs) followed for OSA, Chronic Hypoxic Respiratory Failure, complicated by CHF, ASCVD, , Allergic Rhinitis, Asthma, OHS, Morbid Obesity, Hyperlipidemia, Hx AdenoCA L Lung,  -Breztri ,  Flonase , Neb , Ventolin  hfa,  O2 2L/ [employer] sleep and prn portable CPAP auto 5-15/ [employer]              AirSense 11  AutoSet                 Download-compliance     97%, AHI/ 4.8/hr          Body weight today-305 lbs Last saw EW, NP on 10/03/24     Discussed the use of AI scribe software for clinical note transcription with the patient, who gave verbal consent to proceed.  History of Present Illness   LEEANDRA ELLERSON is a 73 year old female with sleep apnea who presents for a follow-up on her CPAP therapy and oxygen  use.  She uses CPAP nightly with fewer than five apneas per hour. Her device is set to a pressure range of 5 to 15 cm H2O and typically runs around 10. She has used this machine since 2021.  She has six oxygen  tanks, a large stationary concentrator, and a portable device. She uses the stationary machine at night and does not use oxygen  during the day.  She has gained weight and is considering weight management options, including a repeat sleep study.     Assessment and Plan    Obstructive sleep apnea Well-managed with CPAP therapy. CPAP settings effective, with less than 5 apneas per hour. Machine replaced in 2021, with 1-2 years of use remaining before insurance replacement. - Continue CPAP therapy with settings 5-15 cm H2O. - Consider new CPAP machine after five years of use. - Retain old CPAP machine as spare. - Follow up with sleep study on February 5th, 2026.  Nocturnal Hypoxemia  Supplemental oxygen  therapy Supplemental oxygen  equipment in place for nighttime use, meeting Medicare requirements. - Continue using large oxygen  concentrator at night. - Ensure portable oxygen  device availability.   Morbid Obesity -Continue efforts     ROS-see HPI   + = positive Constitutional:    weight loss, night sweats, fevers, chills, fatigue, lassitude. HEENT:   + headaches, difficulty swallowing,+ tooth/dental problems, sore throat,       sneezing, itching, ear ache, nasal congestion, post nasal drip, snoring CV:    chest pain, orthopnea, PND, +swelling in lower extremities, anasarca,                                    dizziness, palpitations Resp:   +shortness of breath with exertion or at rest.                +productive  non-productive cough, coughing up of blood.              change in color of mucus.  wheezing.   Skin:    rash or lesions. GI:  +heartburn, indigestion, abdominal pain, nausea, vomiting, diarrhea,                 change in bowel habits, loss of appetite GU: dysuria, change in color of urine, no urgency or frequency.   flank pain. MS:   +joint pain, stiffness, decreased range of motion, back pain. Neuro-     nothing unusual Psych:  change in mood or affect.  depression or +anxiety.  memory loss.  OBJ- Physical Exam General- Alert, Oriented, Affect-appropriate, Distress- none acute, + morbidly obese   +POC on 2L pulse Skin- rash-none, lesions- none, excoriation- none Lymphadenopathy- none Head- atraumatic            Eyes- Gross vision intact, PERRLA, conjunctivae and secretions clear            Ears- Hearing, canals-normal            Nose- Clear, no-Septal dev, mucus+thrush, polyps, erosion, perforation             Throat- Mallampati II-III, mucosa clear , drainage- none, tonsils- atrophic Neck- flexible , trachea midline, no stridor , thyroid  nl, carotid no bruit Chest - symmetrical excursion , unlabored           Heart/CV- RRR faint , no murmur , no gallop  , no rub, nl s1 s2                           - JVD- none , edema- none, stasis changes- none, varices- none           Lung- clear to P&A, wheeze- none, cough- none , dullness-none, rub- none           Chest wall-  Abd-  Br/ Gen/ Rectal- Not done, not indicated Extrem- cyanosis- none, clubbing, none, atrophy- none, strength- nl Neuro- grossly intact to observation

## 2024-10-13 ENCOUNTER — Encounter: Payer: Self-pay | Admitting: Internal Medicine

## 2024-10-13 ENCOUNTER — Ambulatory Visit: Admitting: Internal Medicine

## 2024-10-13 VITALS — BP 124/68 | HR 74 | Temp 98.0°F | Ht 64.0 in | Wt 305.2 lb

## 2024-10-13 NOTE — Patient Instructions (Signed)
 You are doing well with CPAP. We can continue CPAP auto 5-15, mask of choice, humidifier, supplies, AirView/ card.  Keep appointment in February to update your sleep study, as arranged by Landry Ferrari, NP  At checkout, ask the front desk to bring you back with [facility] in about 6 months,.

## 2024-10-24 ENCOUNTER — Other Ambulatory Visit: Payer: Self-pay

## 2024-12-09 ENCOUNTER — Other Ambulatory Visit: Payer: Self-pay | Admitting: Cardiology

## 2024-12-15 ENCOUNTER — Ambulatory Visit (HOSPITAL_BASED_OUTPATIENT_CLINIC_OR_DEPARTMENT_OTHER): Admitting: Pulmonary Disease

## 2025-01-06 ENCOUNTER — Ambulatory Visit: Admitting: Primary Care

## 2025-02-14 ENCOUNTER — Ambulatory Visit: Admitting: Internal Medicine

## 2025-02-19 ENCOUNTER — Ambulatory Visit (HOSPITAL_BASED_OUTPATIENT_CLINIC_OR_DEPARTMENT_OTHER): Payer: Self-pay | Admitting: Pulmonary Disease
